# Patient Record
Sex: Female | Born: 1945
Health system: Southern US, Community
[De-identification: ages and names within clinical notes are randomized; demographics above are authoritative.]

## PROBLEM LIST (undated history)

## (undated) DIAGNOSIS — Z9981 Dependence on supplemental oxygen: Secondary | ICD-10-CM

## (undated) DIAGNOSIS — J449 Chronic obstructive pulmonary disease, unspecified: Secondary | ICD-10-CM

## (undated) DIAGNOSIS — I00 Rheumatic fever without heart involvement: Secondary | ICD-10-CM

## (undated) DIAGNOSIS — I10 Essential (primary) hypertension: Secondary | ICD-10-CM

## (undated) DIAGNOSIS — Z972 Presence of dental prosthetic device (complete) (partial): Secondary | ICD-10-CM

## (undated) DIAGNOSIS — C801 Malignant (primary) neoplasm, unspecified: Secondary | ICD-10-CM

## (undated) DIAGNOSIS — R0600 Dyspnea, unspecified: Secondary | ICD-10-CM

## (undated) DIAGNOSIS — I509 Heart failure, unspecified: Secondary | ICD-10-CM

## (undated) DIAGNOSIS — Z973 Presence of spectacles and contact lenses: Secondary | ICD-10-CM

## (undated) DIAGNOSIS — H3530 Unspecified macular degeneration: Secondary | ICD-10-CM

## (undated) DIAGNOSIS — J189 Pneumonia, unspecified organism: Secondary | ICD-10-CM

## (undated) DIAGNOSIS — M1990 Unspecified osteoarthritis, unspecified site: Secondary | ICD-10-CM

## (undated) DIAGNOSIS — T4145XA Adverse effect of unspecified anesthetic, initial encounter: Secondary | ICD-10-CM

## (undated) DIAGNOSIS — B029 Zoster without complications: Secondary | ICD-10-CM

## (undated) HISTORY — PX: MULTIPLE TOOTH EXTRACTIONS: SHX2053

## (undated) HISTORY — PX: CHOLECYSTECTOMY: SHX55

---
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
## 1995-08-26 DIAGNOSIS — T8859XA Other complications of anesthesia, initial encounter: Secondary | ICD-10-CM

## 1995-08-26 HISTORY — DX: Other complications of anesthesia, initial encounter: T88.59XA

## 2001-08-10 ENCOUNTER — Ambulatory Visit (HOSPITAL_COMMUNITY): Admission: RE | Admit: 2001-08-10 | Discharge: 2001-08-10 | Payer: Self-pay | Admitting: Family Medicine

## 2001-08-10 ENCOUNTER — Encounter: Payer: Self-pay | Admitting: Family Medicine

## 2002-08-12 ENCOUNTER — Encounter: Payer: Self-pay | Admitting: Family Medicine

## 2002-08-12 ENCOUNTER — Ambulatory Visit (HOSPITAL_COMMUNITY): Admission: RE | Admit: 2002-08-12 | Discharge: 2002-08-12 | Payer: Self-pay | Admitting: Family Medicine

## 2003-08-21 ENCOUNTER — Ambulatory Visit (HOSPITAL_COMMUNITY): Admission: RE | Admit: 2003-08-21 | Discharge: 2003-08-21 | Payer: Self-pay | Admitting: Family Medicine

## 2004-06-26 ENCOUNTER — Inpatient Hospital Stay (HOSPITAL_COMMUNITY): Admission: EM | Admit: 2004-06-26 | Discharge: 2004-07-01 | Payer: Self-pay | Admitting: Emergency Medicine

## 2004-07-15 ENCOUNTER — Ambulatory Visit (HOSPITAL_COMMUNITY): Admission: RE | Admit: 2004-07-15 | Discharge: 2004-07-15 | Payer: Self-pay | Admitting: Family Medicine

## 2005-09-04 ENCOUNTER — Ambulatory Visit (HOSPITAL_COMMUNITY): Admission: RE | Admit: 2005-09-04 | Discharge: 2005-09-04 | Payer: Self-pay | Admitting: Family Medicine

## 2007-04-15 ENCOUNTER — Ambulatory Visit (HOSPITAL_COMMUNITY): Admission: RE | Admit: 2007-04-15 | Discharge: 2007-04-15 | Payer: Self-pay | Admitting: Family Medicine

## 2011-01-10 NOTE — Group Therapy Note (Signed)
[name], [name]                 ACCOUNT NO.:  [id number]   MEDICAL RECORD NO.:  [id number]          PATIENT TYPE:  INP   LOCATION:  A206                          FACILITY:  [facility]   PHYSICIAN:  Vania Rea, M.D. DATE OF BIRTH:  05-03-1946   DATE OF PROCEDURE:  06/28/2004  DATE OF DISCHARGE:                                   PROGRESS NOTE   HOSPITAL DAY #3   SUBJECTIVE:  Feels much better, feels pretty close to her baseline, but is  having some visual hallucinations with her eyes closed.   OBJECTIVE:  VITALS:  Temperature 97.2, pulse 113, respirations 22, blood  pressure 111/54, fasting capillary blood sugar 141, she is saturating at 97%  on room air.  CHEST:  She has bibasilar crackles.  ABDOMEN:  Soft and nontender.  EXTREMITIES:  Without edema.   LABS:  Her white count has increased dramatically to 26,000.  Her hemoglobin  is 12.3.  Her platelets are 349,000.  Her absolute neutrophil count has  increased to 22.6.  Her sodium is 137, potassium 4.6, chloride 108, CO2 23,  glucose 135, BUN 12, creatinine 0.7, calcium 8.0.  Her ABG on room air this  morning -- pH was 7.41, pCO2 32, pO2 57.9, saturating at 89.9%.   ASSESSMENT:  1.  Bilobar pneumonia.  2.  Emphysema.  3.  Worsening leukocytosis.  4.  Acute exacerbation of chronic obstructive pulmonary disease.  5.  Persistent tachycardia.   PLAN:  In view of her green sputum and rising white count, we will culture  her sputum, reculture her blood, discontinue Rocephin and Zithromax in favor  of Vancomycin and Zosyn while her cultures are pending.  Her Solu-Medrol is  tapered and we will now start her on a tapered dose of Medrol since she has  a history of INTOLERANCE TO PREDNISONE.  In view of her persistent  tachycardia, we will try to resume her on the higher dose of Toprol which  she was getting as an outpatient and we will reduce her dose of Avapro.     Leop   LC/MEDQ  D:  06/28/2004  T:  06/28/2004  Job:   [id number]

## 2011-01-10 NOTE — Group Therapy Note (Signed)
Debbie [name], Debbie [name]                 ACCOUNT NO.:  [id number]   MEDICAL RECORD NO.:  [id number]          PATIENT TYPE:  INP   LOCATION:  A206                          FACILITY:  [facility]   PHYSICIAN:  Jackie Plum, M.D.DATE OF BIRTH:  1946/08/04   DATE OF PROCEDURE:  DATE OF DISCHARGE:                                   PROGRESS NOTE   SUBJECTIVE:  The patient denies visual hallucinations  today.  No chills.  Denies any fever.  Cough is better.  No chest pain.  Shortness of breath is  significantly improved.   OBJECTIVE:  Vital signs:  Stable.  Blood pressure 115/50, heart rate 102,  repeat radial pulse by me was 102 per minute.  The patient  was afebrile with respirations of 20 per minute.  No JVD.  Chest:  Crackles  at both bases with decreased breath sounds.  Cardiac:  Regular.  No gallops.  The patient was mildly tachycardic.  Abdomen:  Soft, nontender, bowel sounds  present and normoactive.  Extremities:  No cyanosis, no edema.   Lab work was reviewed.  Blood cultures x2 show one set positive for gram-  positive clusters.  WBC count down to 21.3 from 26.0.  Sodium is 132. .   ASSESSMENT:  1.  Pneumonia.  2. Chronic obstructive pulmonary disease.  3. Leukocytosis,      improving, secondary to diagnosis #1.   PLAN:  To continue antibiotic treatment with careful followup with  leukocytosis.  Seems overall to be improving.  Would not recommend CT scan  of chest at this point.     Geor   GO/MEDQ  D:  06/29/2004  T:  06/29/2004  Job:  [id number]

## 2011-01-10 NOTE — Discharge Summary (Signed)
Debbie [name], Debbie [name]                 ACCOUNT NO.:  [id number]   MEDICAL RECORD NO.:  [id number]          PATIENT TYPE:  INP   LOCATION:  A206                          FACILITY:  [facility]   PHYSICIAN:  Vania Rea, M.D. DATE OF BIRTH:  1945/09/08   DATE OF ADMISSION:  06/26/2004  DATE OF DISCHARGE:  11/07/2005LH                                 DISCHARGE SUMMARY   PRIMARY CARE PHYSICIAN:  Unassigned.   DISCHARGE DIAGNOSES:  1.  Bilateral lower lobe pneumonia.  2.  Acute exacerbation of chronic obstructive pulmonary disease.  3.  Emphysema.  4.  Persistent leukocytosis.  5.  Hypertension, controlled.   DISPOSITION:  Discharged to home.   DISCHARGE CONDITION:  Stable.   DISCHARGE MEDICATIONS:  1.  Levaquin 750 mg daily for five days.  2.  Advair 250/50 one puff twice daily.  3.  Spiriva 18 mcg daily.  4.  Mucinex 1200 mg twice daily.  5.  Toprol XL 100 mg daily.  6.  Avapro 150 mg daily.  7.  Prempro one daily.  8.  Albuterol inhaler two puffs every four hours as necessary.  9.  Medrol tablets 32 mg daily, tapering by 8 mg every three days.   HOSPITAL COURSE:  Please refer to admission history and physical.  This is a  65 year old Caucasian lady with chronic bronchitis and hypertension who  presented with acute onset of shortness of breath unrelieved by repeated  nebulizations in the emergency room.  Hospitalist services called to admit.  Chest x-ray revealed bilateral lower lobe pneumonia and she had a white  count elevated at 23.9  The patient was admitted, treated with antibiotics,  intravenous steroids and nebulizations and clinically improved to about 50%  of her baseline within 24 hours, but her white count dramatically increased  and she continued to cough up green sputum.  The patient had blood and  sputum cultures, but these were persistently negative for pathogens.  In  view of her rising white count, her antibiotics were changed from her  initial Rocephin and  Zithromax to vancomycin and Zosyn.  However, while she  continued to improve clinically, her white count continued to increase.   Because of a report of tachycardia associated with prednisone, the patient  was put on oral Medrol once intravenous steroids were discontinued.   Today, patient says that she actually feels much better than if at her  baseline, although she continues to cough.   VITAL SIGNS:  Temperature is 97.1; pulse 81; respirations 24; blood pressure  152/78; her fasting blood sugar was 85.  CHEST:  She has bibasilar dullness, but no wheezing.  CARDIOVASCULAR:  Regular rhythm.  ABDOMEN:  Soft and nontender.  EXTREMITIES:  Without edema.   LABORATORIES:  Her white count remains elevated at 25,000.  Her hemoglobin  stable at 13.6.  Her absolute neutrophil count is 16.6.  Sodium is 134,  potassium 3.8, chloride 98, CO2 29, glucose is 80, BUN 13, creatinine 0.7,  calcium 9.1.   FOLLOW UP:  The patient is to be followed up at [facility]  Department.  She is to have a CBC in one week, the results to go to  [facility] Department.     Leop   LC/MEDQ  D:  07/01/2004  T:  07/01/2004  Job:  [id number]

## 2011-01-10 NOTE — H&P (Signed)
[name], Debbie                 ACCOUNT NO.:  [id number]   MEDICAL RECORD NO.:  [id number]          PATIENT TYPE:  EMS   LOCATION:  ED                            FACILITY:  [facility]   PHYSICIAN:  Vania Rea, M.D. DATE OF BIRTH:  [birth year]   DATE OF ADMISSION:  06/26/2004  DATE OF DISCHARGE:  LH                                HISTORY & PHYSICAL   PRIMARY CARE PHYSICIAN:  [facility]   CHIEF COMPLAINT:  Acute shortness of breath for the past 2 days.   HISTORY OF PRESENT ILLNESS:  This is a 65 year old Caucasian lady with a  history of chronic bronchitis and hypertension whose baseline is that she  has wheezing every day.  It is helped by metered-dose inhalers and albuterol  nebs.  She takes Toprol-XL 100 mg daily for her blood pressure.  Was in her  until about 2 or 3 days ago when she developed a flulike syndrome with  fever, chills, nausea, vomiting, and diarrhea.  The patient had about 1 or 2  weeks ago gone to the public health department for acute exacerbation of her  COPD and had been given prednisone tablets but she said that they caused  palpitations so she was not taking them.  She went to the public health  department today and they sent her to the emergency room by ambulance.  In  the emergency room, the patient has had repeated nebs treatments without  settling of her shortness of breath.  Her chest x-ray shows bilateral lower  lobe infiltrates and the hospitalists service was called for admission.  baseline state of chronic shortness of breath and dyspnea on exertion   The patient says this is the worst attack she has had in a long time.  She  has never been admitted for acute shortness of breath.  Never been intubated  because of difficulty breathing.  Is having severe right-sided chest pain  associated with this breathing.  She denies any history of cardiac problems  or cardiac arrhythmias.   PAST MEDICAL HISTORY:  1.   Hypertension.  2.  Chronic bronchitis/COPD.  3.  Rheumatic fever as a child.   PAST SURGICAL HISTORY:  Status post gallbladder surgery in 1997.   MEDICATIONS:  1.  Toprol-XL 100 mg daily.  2.  Prempro one tablet daily.  3.  HCTZ 25 mg daily.  4.  Albuterol nebs p.r.n.  5.  Advair 250/50 one puff twice daily.  6.  Noncompliant with prednisone recently as prescribed.   ALLERGIES:  No known drug allergies.   SOCIAL HISTORY:  Divorced 10 years ago after a 30-year marriage producing 5  children.  She lives alone, works occasionally as a Neurosurgeon.  Discontinued tobacco use about 13 years ago after 30 years of one pack per  day.  Denies alcohol or illicit drug use.   FAMILY HISTORY:  Significant for a mother who died in her 110s of lung cancer  who was also diabetic.  A father who died at age 55 from a myocardial  infarction, also had prostate cancer.  She has 6 siblings.  One brother who  had a myocardial infarction.  Others are ill but she is not sure of the  exact nature of their medical problems.  She has 5 adult children who are  reportedly in good health.   REVIEW OF SYSTEMS:  She denies headaches, eye problems although she does  wear glasses.  She denies sore throat.  She has been having an upper  respiratory infection with a cough productive of sputum that she does not  look at and does not know the quality of it.  Has been having fever and  chills.  She usually has severe dyspnea walking about less than 1/2 a block.  Denies orthopnea, PND, or lower extremity edema.  Recently been having  nausea and vomiting as noted in the Admission History and Physical.  Does  not know what the stools look like.  No genitourinary problems.  Denies  joint problems.  Denies any anxiety, depression, focal weakness, or any  history of stroke.  Denies any history of thyroid problems or diabetes.   PHYSICAL EXAMINATION:  GENERAL:  A very pleasant middle-aged Caucasian lady  sitting up in the  stretcher clearly dyspneic and in distress.  VITAL SIGNS:  Temperature 97.5, pulse 142, blood pressure 147/96,  respiratory rate is 32.  Chest pain with breathing she says is 8/10.  She is  saturating at 92% with supplemental oxygen.  HEENT:  Her pupils are round, equal, and reactive to light.  Her mucous  membranes are pink and anicteric.  Her oral cavity is mildly dehydrated.  CHEST:  There is no frank wheezing at this time but she is very tachypneic.  She has just completed a nebs treatment.  HEART:  She is tachycardic.  ABDOMEN:  Soft, nontender.  EXTREMITIES:  Without edema.  Her pulses are 2+ bilaterally.  She has no  joint deformities.  CENTRAL NERVOUS SYSTEM:  She is alert and oriented x3.  She has no focal  neurologic deficit.   LABORATORY DATA:  Her white count is 23.9, hematocrit 44, MCV 91, RDW 12.3,  platelets 324.  Her absolute granulocyte count is 20.5, monocytes are also  slightly increased.  She has over 20% bandemia.  Her chemistry:  Sodium is  133, potassium 3, chloride 94, CO2 26, glucose 140, BUN 5, creatinine 0.8,  calcium 9.3, total protein 7.6, albumin 3.3, AST and ALT are 14 and 18, alk  phos is 117, total bilirubin 1.  Her B-natriuretic peptide is less than 30.  ABG done earlier in her hospital course, her pH was 7.51, PCO2 29.9, PO2 63,  her saturation was 93.8% on FiO2 of 32%.  Chest x-ray reports are as above.   ASSESSMENT:  1.  Acute exacerbation of chronic obstructive pulmonary disease possibly due      to #2.  2.  Bilateral lower lobe pneumonia.  3.  Hypokalemia.  4.  History of upper respiratory flulike syndrome with nausea, vomiting, and      diarrhea.  5.  Hyperglycemia.   PLAN:  We will admit this lady.  We will hydrate her.  We will repeat her  potassium.  We will start her on Rocephin & Zithromax pending a blood,  urine, and sputum cultures and  gram smear.  We will give her round-the- clock nebs and steroid support.  We will consider tapering  her beta-blockers  and substituting another form of antihypertensive medication.  We will have  her on a heart monitor.  Leop   LC/MEDQ  D:  06/26/2004  T:  06/26/2004  Job:  [id number]

## 2011-01-10 NOTE — Group Therapy Note (Signed)
Debbie [name], Debbie [name]                 ACCOUNT NO.:  [id number]   MEDICAL RECORD NO.:  [id number]          PATIENT TYPE:  INP   LOCATION:  A206                          FACILITY:  [facility]   PHYSICIAN:  Vania Rea, M.D. DATE OF BIRTH:  [birth year]   DATE OF PROCEDURE:  DATE OF DISCHARGE:                                   PROGRESS NOTE   SUBJECTIVE:  Hospital day two.  Feels much better but still very short of  breath.  Feels about 50% of her normal self.   PHYSICAL EXAMINATION:  VITAL SIGNS:  Temperature 97.1, pulse 116,  respirations 20, blood pressure 110/63, fasting capillary blood sugar 141,  saturating at 89% on room air, 92% on two liters.  CHEST:  She is tachypneic.  She has no wheezing.  Prolonged expiration.  Coarse bibasilar crackles.  CARDIOVASCULAR:  She is tachycardic.  ABDOMEN:  Soft, nontender.  EXTREMITIES:  She has trace edema bilaterally.   LABORATORY DATA:  Her ABG on room air pH 7.47, PCO2 32.2, PO2 59, 91%  saturation.  Her white count has fallen slightly to 20.2, hemoglobin 14.6,  platelets 329.  Her absolute neutrophil count 17.8.  Her sodium remains low  at 132, potassium has normalized at 4.2, chloride 101, CO2 25, glucose 188,  BUN 6, creatinine 0.7, calcium 8.8.   ASSESSMENT:  Acute exacerbation of chronic obstructive pulmonary disease and  bilobar pneumonia.  Will continue double antibiotic coverage.  Will await  results of her blood cultures.  Will start to taper her steroids.  Will  continue sliding scale coverage of her hyperglycemia.  Although her blood  sugars are in the diabetic range she is on steroids.  We will need to have  her checked as an outpatient for frank diabetes.  Her hypertension is  controlled on the current regimen of a reduced beta blockers and ARB's.     Leop   LC/MEDQ  D:  06/27/2004  T:  06/27/2004  Job:  [id number]

## 2012-11-10 ENCOUNTER — Other Ambulatory Visit (HOSPITAL_COMMUNITY): Payer: Self-pay | Admitting: Internal Medicine

## 2012-11-10 DIAGNOSIS — Z139 Encounter for screening, unspecified: Secondary | ICD-10-CM

## 2012-11-16 ENCOUNTER — Ambulatory Visit (HOSPITAL_COMMUNITY)
Admission: RE | Admit: 2012-11-16 | Discharge: 2012-11-16 | Disposition: A | Discharge status: Home / Self Care | Payer: Medicare Other | Source: Ambulatory Visit | Attending: Internal Medicine | Admitting: Internal Medicine

## 2012-11-16 DIAGNOSIS — Z1231 Encounter for screening mammogram for malignant neoplasm of breast: Secondary | ICD-10-CM | POA: Insufficient documentation

## 2012-11-16 DIAGNOSIS — Z139 Encounter for screening, unspecified: Secondary | ICD-10-CM

## 2012-11-17 ENCOUNTER — Other Ambulatory Visit: Payer: Self-pay | Admitting: Internal Medicine

## 2012-11-17 DIAGNOSIS — R928 Other abnormal and inconclusive findings on diagnostic imaging of breast: Secondary | ICD-10-CM

## 2012-11-24 ENCOUNTER — Ambulatory Visit (HOSPITAL_COMMUNITY)
Admission: RE | Admit: 2012-11-24 | Discharge: 2012-11-24 | Disposition: A | Discharge status: Home / Self Care | Payer: Medicare Other | Source: Ambulatory Visit | Attending: Internal Medicine | Admitting: Internal Medicine

## 2012-11-24 ENCOUNTER — Other Ambulatory Visit: Payer: Self-pay | Admitting: Internal Medicine

## 2012-11-24 DIAGNOSIS — R928 Other abnormal and inconclusive findings on diagnostic imaging of breast: Secondary | ICD-10-CM

## 2012-12-14 ENCOUNTER — Other Ambulatory Visit (HOSPITAL_COMMUNITY): Payer: Self-pay

## 2012-12-14 DIAGNOSIS — J441 Chronic obstructive pulmonary disease with (acute) exacerbation: Secondary | ICD-10-CM

## 2012-12-23 ENCOUNTER — Ambulatory Visit (HOSPITAL_COMMUNITY)
Admission: RE | Admit: 2012-12-23 | Payer: Medicare Other | Source: Ambulatory Visit | Attending: Internal Medicine | Admitting: Internal Medicine

## 2012-12-29 ENCOUNTER — Ambulatory Visit (HOSPITAL_COMMUNITY)
Admission: RE | Admit: 2012-12-29 | Discharge: 2012-12-29 | Disposition: A | Discharge status: Home / Self Care | Payer: Medicare Other | Source: Ambulatory Visit | Attending: Internal Medicine | Admitting: Internal Medicine

## 2012-12-29 DIAGNOSIS — J441 Chronic obstructive pulmonary disease with (acute) exacerbation: Secondary | ICD-10-CM | POA: Insufficient documentation

## 2012-12-29 DIAGNOSIS — R0609 Other forms of dyspnea: Secondary | ICD-10-CM | POA: Insufficient documentation

## 2012-12-29 DIAGNOSIS — R0989 Other specified symptoms and signs involving the circulatory and respiratory systems: Secondary | ICD-10-CM | POA: Insufficient documentation

## 2012-12-29 MED ORDER — ALBUTEROL SULFATE (5 MG/ML) 0.5% IN NEBU
2.5000 mg | INHALATION_SOLUTION | Freq: Once | RESPIRATORY_TRACT | Status: AC
  Administered 2012-12-29: 2.5 mg via RESPIRATORY_TRACT

## 2012-12-31 NOTE — Procedures (Signed)
Debbie [name], SHIFFER                 ACCOUNT NO.:  [id number]  MEDICAL RECORD NO.:  [id number]  LOCATION:  RESP                          FACILITY:  [facility]  PHYSICIAN:  Margrette Wynia L. Juanetta Gosling, M.D.DATE OF BIRTH:  Jan 20, 1946  DATE OF PROCEDURE: DATE OF DISCHARGE:                           PULMONARY FUNCTION TEST   REASON FOR PULMONARY FUNCTION TESTING:  COPD exacerbation.  1. Spirometry shows a severe ventilatory defect with evidence of     airflow obstruction. 2. Lung volumes show significant air trapping. 3. DLCO is severely reduced. 4. Airflow obstruction is elevated confirming the presence of airflow     obstruction. 5. This study is consistent with the clinical diagnosis of chronic     obstructive pulmonary disease.     Coralee Edberg L. Juanetta Gosling, M.D.     ELH/MEDQ  D:  12/30/2012  T:  12/31/2012  Job:  [id number]  cc:   Catalina Pizza, M.D. Fax: [phone]

## 2013-01-07 NOTE — Procedures (Signed)
Debbie [name], Debbie [name]                 ACCOUNT NO.:  [id number]  MEDICAL RECORD NO.:  [id number]  LOCATION:  RESP                          FACILITY:  [facility]  PHYSICIAN:  Sundance Moise L. Juanetta Gosling, M.D.DATE OF BIRTH:  1946-05-24  DATE OF PROCEDURE: DATE OF DISCHARGE:  12/29/2012                           PULMONARY FUNCTION TEST   REASON FOR PULMONARY FUNCTION TESTING:  COPD exacerbation.  1. Spirometry shows severe ventilatory defect with airflow     obstruction. 2. Lung volumes show marked air trapping. 3. DLCO is severely reduced. 4. Airway resistance is high confirming the presence of airflow     obstruction. 5. There is improvement with inhaled bronchodilator that reaches the     level of significance. 6. This study is consistent with COPD.     Thao Bauza L. Juanetta Gosling, M.D.     ELH/MEDQ  D:  01/06/2013  T:  01/07/2013  Job:  [id number]  cc:   Catalina Pizza, M.D. Fax: [phone]

## 2013-01-12 NOTE — Procedures (Signed)
NAMEMARIZOL, Debbie [name]                 ACCOUNT NO.:  [id number]  MEDICAL RECORD NO.:  [id number]  LOCATION:                                 FACILITY:  PHYSICIAN:  Dason Mosley L. Juanetta Gosling, M.D.DATE OF BIRTH:  [birth year]  DATE OF PROCEDURE:  01/11/2013 DATE OF DISCHARGE:                           PULMONARY FUNCTION TEST   Reason for pulmonary function testing is COPD exacerbation.  1. Spirometry shows severe ventilatory defect with evidence of airflow     obstruction. 2. Lung volumes show air trapping. 3. DLCO is severely reduced, but corrects somewhat when ventilation is     taken into account. 4. Airway resistance is elevated, confirming the presence of airflow     obstruction. 5. There is significant improvement with inhaled bronchodilator. 6. This study is consistent with COPD.     Delphina Schum L. Juanetta Gosling, M.D.     ELH/MEDQ  D:  01/11/2013  T:  01/12/2013  Job:  [id number]  cc:   Catalina Pizza, M.D. Fax: [phone]

## 2013-01-13 LAB — PULMONARY FUNCTION TEST

## 2013-02-22 ENCOUNTER — Institutional Professional Consult (permissible substitution): Payer: Medicare Other | Admitting: Internal Medicine

## 2014-01-30 ENCOUNTER — Other Ambulatory Visit (HOSPITAL_COMMUNITY): Payer: Self-pay | Admitting: Internal Medicine

## 2014-01-30 DIAGNOSIS — Z1231 Encounter for screening mammogram for malignant neoplasm of breast: Secondary | ICD-10-CM

## 2014-02-06 ENCOUNTER — Ambulatory Visit (HOSPITAL_COMMUNITY)
Admission: RE | Admit: 2014-02-06 | Discharge: 2014-02-06 | Disposition: A | Discharge status: Home / Self Care | Payer: Medicare Other | Source: Ambulatory Visit | Attending: Internal Medicine | Admitting: Internal Medicine

## 2014-02-06 DIAGNOSIS — Z1231 Encounter for screening mammogram for malignant neoplasm of breast: Secondary | ICD-10-CM

## 2014-09-14 DIAGNOSIS — J449 Chronic obstructive pulmonary disease, unspecified: Secondary | ICD-10-CM | POA: Diagnosis not present

## 2014-09-14 DIAGNOSIS — I10 Essential (primary) hypertension: Secondary | ICD-10-CM | POA: Diagnosis not present

## 2014-09-14 DIAGNOSIS — J019 Acute sinusitis, unspecified: Secondary | ICD-10-CM | POA: Diagnosis not present

## 2014-10-03 DIAGNOSIS — H8110 Benign paroxysmal vertigo, unspecified ear: Secondary | ICD-10-CM | POA: Diagnosis not present

## 2014-10-03 DIAGNOSIS — H65199 Other acute nonsuppurative otitis media, unspecified ear: Secondary | ICD-10-CM | POA: Diagnosis not present

## 2014-10-03 DIAGNOSIS — I10 Essential (primary) hypertension: Secondary | ICD-10-CM | POA: Diagnosis not present

## 2014-10-03 DIAGNOSIS — Z6822 Body mass index (BMI) 22.0-22.9, adult: Secondary | ICD-10-CM | POA: Diagnosis not present

## 2014-10-05 DIAGNOSIS — H8110 Benign paroxysmal vertigo, unspecified ear: Secondary | ICD-10-CM | POA: Diagnosis not present

## 2014-10-05 DIAGNOSIS — L570 Actinic keratosis: Secondary | ICD-10-CM | POA: Diagnosis not present

## 2014-10-05 DIAGNOSIS — I10 Essential (primary) hypertension: Secondary | ICD-10-CM | POA: Diagnosis not present

## 2014-10-05 DIAGNOSIS — J449 Chronic obstructive pulmonary disease, unspecified: Secondary | ICD-10-CM | POA: Diagnosis not present

## 2014-10-19 DIAGNOSIS — D0439 Carcinoma in situ of skin of other parts of face: Secondary | ICD-10-CM | POA: Diagnosis not present

## 2014-10-19 DIAGNOSIS — L570 Actinic keratosis: Secondary | ICD-10-CM | POA: Diagnosis not present

## 2014-10-19 DIAGNOSIS — Z85828 Personal history of other malignant neoplasm of skin: Secondary | ICD-10-CM | POA: Diagnosis not present

## 2014-10-19 DIAGNOSIS — Z08 Encounter for follow-up examination after completed treatment for malignant neoplasm: Secondary | ICD-10-CM | POA: Diagnosis not present

## 2014-10-19 DIAGNOSIS — X32XXXA Exposure to sunlight, initial encounter: Secondary | ICD-10-CM | POA: Diagnosis not present

## 2014-12-07 DIAGNOSIS — L570 Actinic keratosis: Secondary | ICD-10-CM | POA: Diagnosis not present

## 2014-12-07 DIAGNOSIS — X32XXXD Exposure to sunlight, subsequent encounter: Secondary | ICD-10-CM | POA: Diagnosis not present

## 2014-12-07 DIAGNOSIS — C44311 Basal cell carcinoma of skin of nose: Secondary | ICD-10-CM | POA: Diagnosis not present

## 2014-12-13 DIAGNOSIS — J449 Chronic obstructive pulmonary disease, unspecified: Secondary | ICD-10-CM | POA: Diagnosis not present

## 2014-12-13 DIAGNOSIS — J201 Acute bronchitis due to Hemophilus influenzae: Secondary | ICD-10-CM | POA: Diagnosis not present

## 2014-12-13 DIAGNOSIS — I10 Essential (primary) hypertension: Secondary | ICD-10-CM | POA: Diagnosis not present

## 2015-03-13 DIAGNOSIS — I10 Essential (primary) hypertension: Secondary | ICD-10-CM | POA: Diagnosis not present

## 2015-03-13 DIAGNOSIS — R51 Headache: Secondary | ICD-10-CM | POA: Diagnosis not present

## 2015-03-15 DIAGNOSIS — J449 Chronic obstructive pulmonary disease, unspecified: Secondary | ICD-10-CM | POA: Diagnosis not present

## 2015-03-15 DIAGNOSIS — I10 Essential (primary) hypertension: Secondary | ICD-10-CM | POA: Diagnosis not present

## 2015-04-06 DIAGNOSIS — J449 Chronic obstructive pulmonary disease, unspecified: Secondary | ICD-10-CM | POA: Diagnosis not present

## 2015-04-06 DIAGNOSIS — H65199 Other acute nonsuppurative otitis media, unspecified ear: Secondary | ICD-10-CM | POA: Diagnosis not present

## 2015-04-06 DIAGNOSIS — H8110 Benign paroxysmal vertigo, unspecified ear: Secondary | ICD-10-CM | POA: Diagnosis not present

## 2015-04-06 DIAGNOSIS — Z6822 Body mass index (BMI) 22.0-22.9, adult: Secondary | ICD-10-CM | POA: Diagnosis not present

## 2015-05-29 DIAGNOSIS — J449 Chronic obstructive pulmonary disease, unspecified: Secondary | ICD-10-CM | POA: Diagnosis not present

## 2015-05-29 DIAGNOSIS — I10 Essential (primary) hypertension: Secondary | ICD-10-CM | POA: Diagnosis not present

## 2015-06-19 DIAGNOSIS — Z23 Encounter for immunization: Secondary | ICD-10-CM | POA: Diagnosis not present

## 2015-07-26 DIAGNOSIS — L089 Local infection of the skin and subcutaneous tissue, unspecified: Secondary | ICD-10-CM | POA: Diagnosis not present

## 2015-10-04 DIAGNOSIS — I10 Essential (primary) hypertension: Secondary | ICD-10-CM | POA: Diagnosis not present

## 2015-10-09 DIAGNOSIS — H8110 Benign paroxysmal vertigo, unspecified ear: Secondary | ICD-10-CM | POA: Diagnosis not present

## 2015-10-09 DIAGNOSIS — I10 Essential (primary) hypertension: Secondary | ICD-10-CM | POA: Diagnosis not present

## 2015-10-09 DIAGNOSIS — J449 Chronic obstructive pulmonary disease, unspecified: Secondary | ICD-10-CM | POA: Diagnosis not present

## 2015-10-09 DIAGNOSIS — L608 Other nail disorders: Secondary | ICD-10-CM | POA: Diagnosis not present

## 2015-10-11 ENCOUNTER — Other Ambulatory Visit (HOSPITAL_COMMUNITY): Payer: Self-pay | Admitting: Internal Medicine

## 2015-10-11 ENCOUNTER — Ambulatory Visit (HOSPITAL_COMMUNITY)
Admission: RE | Admit: 2015-10-11 | Discharge: 2015-10-11 | Disposition: A | Discharge status: Home / Self Care | Payer: Medicare Other | Source: Ambulatory Visit | Attending: Internal Medicine | Admitting: Internal Medicine

## 2015-10-11 DIAGNOSIS — M795 Residual foreign body in soft tissue: Secondary | ICD-10-CM | POA: Diagnosis not present

## 2015-10-11 DIAGNOSIS — S6992XA Unspecified injury of left wrist, hand and finger(s), initial encounter: Secondary | ICD-10-CM | POA: Diagnosis not present

## 2015-10-19 ENCOUNTER — Observation Stay (HOSPITAL_COMMUNITY)
Admission: EM | Admit: 2015-10-19 | Discharge: 2015-10-21 | Disposition: A | Discharge status: Home / Self Care | Payer: Medicare Other | Attending: Internal Medicine | Admitting: Internal Medicine

## 2015-10-19 ENCOUNTER — Encounter (HOSPITAL_COMMUNITY): Payer: Self-pay

## 2015-10-19 ENCOUNTER — Emergency Department (HOSPITAL_COMMUNITY): Payer: Medicare Other

## 2015-10-19 DIAGNOSIS — I10 Essential (primary) hypertension: Secondary | ICD-10-CM | POA: Diagnosis not present

## 2015-10-19 DIAGNOSIS — J9601 Acute respiratory failure with hypoxia: Secondary | ICD-10-CM | POA: Diagnosis not present

## 2015-10-19 DIAGNOSIS — J441 Chronic obstructive pulmonary disease with (acute) exacerbation: Principal | ICD-10-CM | POA: Diagnosis present

## 2015-10-19 DIAGNOSIS — Z87891 Personal history of nicotine dependence: Secondary | ICD-10-CM | POA: Diagnosis not present

## 2015-10-19 DIAGNOSIS — R0682 Tachypnea, not elsewhere classified: Secondary | ICD-10-CM | POA: Diagnosis not present

## 2015-10-19 DIAGNOSIS — R0602 Shortness of breath: Secondary | ICD-10-CM | POA: Diagnosis not present

## 2015-10-19 DIAGNOSIS — R000 Tachycardia, unspecified: Secondary | ICD-10-CM | POA: Diagnosis not present

## 2015-10-19 DIAGNOSIS — R062 Wheezing: Secondary | ICD-10-CM | POA: Diagnosis not present

## 2015-10-19 DIAGNOSIS — J9811 Atelectasis: Secondary | ICD-10-CM | POA: Diagnosis not present

## 2015-10-19 HISTORY — DX: Essential (primary) hypertension: I10

## 2015-10-19 HISTORY — DX: Chronic obstructive pulmonary disease, unspecified: J44.9

## 2015-10-19 HISTORY — DX: Rheumatic fever without heart involvement: I00

## 2015-10-19 LAB — CBC WITH DIFFERENTIAL/PLATELET
BASOS ABS: 0 10*3/uL (ref 0.0–0.1)
Basophils Relative: 0 %
EOS PCT: 0 %
Eosinophils Absolute: 0 10*3/uL (ref 0.0–0.7)
HCT: 44.5 % (ref 36.0–46.0)
Hemoglobin: 14.7 g/dL (ref 12.0–15.0)
LYMPHS PCT: 24 %
Lymphs Abs: 1.6 10*3/uL (ref 0.7–4.0)
MCH: 31.1 pg (ref 26.0–34.0)
MCHC: 33 g/dL (ref 30.0–36.0)
MCV: 94.1 fL (ref 78.0–100.0)
MONO ABS: 0.8 10*3/uL (ref 0.1–1.0)
MONOS PCT: 11 %
Neutro Abs: 4.3 10*3/uL (ref 1.7–7.7)
Neutrophils Relative %: 65 %
PLATELETS: 194 10*3/uL (ref 150–400)
RBC: 4.73 MIL/uL (ref 3.87–5.11)
RDW: 12.6 % (ref 11.5–15.5)
WBC: 6.7 10*3/uL (ref 4.0–10.5)

## 2015-10-19 LAB — MRSA PCR SCREENING: MRSA by PCR: NEGATIVE

## 2015-10-19 LAB — BASIC METABOLIC PANEL
ANION GAP: 10 (ref 5–15)
BUN: 8 mg/dL (ref 6–20)
CALCIUM: 9.1 mg/dL (ref 8.9–10.3)
CO2: 28 mmol/L (ref 22–32)
CREATININE: 0.67 mg/dL (ref 0.44–1.00)
Chloride: 101 mmol/L (ref 101–111)
GFR calc Af Amer: >60 mL/min (ref >60)
GLUCOSE: 138 mg/dL — AB (ref 65–99)
Potassium: 4.3 mmol/L (ref 3.5–5.1)
Sodium: 139 mmol/L (ref 135–145)

## 2015-10-19 LAB — BRAIN NATRIURETIC PEPTIDE: B Natriuretic Peptide: 25 pg/mL (ref 0.0–100.0)

## 2015-10-19 LAB — INFLUENZA PANEL BY PCR (TYPE A & B)
H1N1FLUPCR: NOT DETECTED
INFLBPCR: NEGATIVE
Influenza A By PCR: NEGATIVE

## 2015-10-19 LAB — TROPONIN I: Troponin I: <0.03 ng/mL (ref <0.031)

## 2015-10-19 MED ORDER — SODIUM CHLORIDE 0.9% FLUSH
3.0000 mL | Freq: Two times a day (BID) | INTRAVENOUS | Status: DC
  Administered 2015-10-19 – 2015-10-20 (×3): 3 mL via INTRAVENOUS

## 2015-10-19 MED ORDER — IPRATROPIUM BROMIDE 0.02 % IN SOLN
0.5000 mg | Freq: Once | RESPIRATORY_TRACT | Status: AC
  Administered 2015-10-19: 0.5 mg via RESPIRATORY_TRACT
  Filled 2015-10-19: qty 2.5

## 2015-10-19 MED ORDER — ALBUTEROL SULFATE (2.5 MG/3ML) 0.083% IN NEBU
2.5000 mg | INHALATION_SOLUTION | RESPIRATORY_TRACT | Status: DC | PRN
Start: 2015-10-19 — End: 2015-10-21
  Administered 2015-10-19 – 2015-10-20 (×2): 2.5 mg via RESPIRATORY_TRACT
  Filled 2015-10-19 (×2): qty 3

## 2015-10-19 MED ORDER — SENNOSIDES-DOCUSATE SODIUM 8.6-50 MG PO TABS: 1.0000 | ORAL_TABLET | Freq: Every evening | ORAL | Status: DC | PRN

## 2015-10-19 MED ORDER — METHYLPREDNISOLONE SODIUM SUCC 125 MG IJ SOLR
125.0000 mg | Freq: Once | INTRAMUSCULAR | Status: AC
  Administered 2015-10-19: 125 mg via INTRAVENOUS
  Filled 2015-10-19: qty 2

## 2015-10-19 MED ORDER — IPRATROPIUM-ALBUTEROL 0.5-2.5 (3) MG/3ML IN SOLN
3.0000 mL | Freq: Four times a day (QID) | RESPIRATORY_TRACT | Status: DC
  Administered 2015-10-20 – 2015-10-21 (×7): 3 mL via RESPIRATORY_TRACT
  Filled 2015-10-19 (×7): qty 3

## 2015-10-19 MED ORDER — OSELTAMIVIR PHOSPHATE 75 MG PO CAPS
75.0000 mg | ORAL_CAPSULE | Freq: Two times a day (BID) | ORAL | Status: DC
  Administered 2015-10-19 – 2015-10-20 (×3): 75 mg via ORAL
  Filled 2015-10-19 (×3): qty 1

## 2015-10-19 MED ORDER — SODIUM CHLORIDE 0.9% FLUSH
3.0000 mL | INTRAVENOUS | Status: DC | PRN
  Administered 2015-10-19: 3 mL via INTRAVENOUS
  Filled 2015-10-19: qty 3

## 2015-10-19 MED ORDER — MAGNESIUM SULFATE IN D5W 10-5 MG/ML-% IV SOLN
1.0000 g | Freq: Once | INTRAVENOUS | Status: AC
  Administered 2015-10-19: 1 g via INTRAVENOUS
  Filled 2015-10-19: qty 100

## 2015-10-19 MED ORDER — LEVOFLOXACIN 750 MG PO TABS
750.0000 mg | ORAL_TABLET | Freq: Every day | ORAL | Status: DC
  Administered 2015-10-19 – 2015-10-21 (×3): 750 mg via ORAL
  Filled 2015-10-19 (×3): qty 1

## 2015-10-19 MED ORDER — LEVOFLOXACIN IN D5W 500 MG/100ML IV SOLN
500.0000 mg | Freq: Once | INTRAVENOUS | Status: AC
  Administered 2015-10-19: 500 mg via INTRAVENOUS
  Filled 2015-10-19: qty 100

## 2015-10-19 MED ORDER — ONDANSETRON HCL 4 MG/2ML IJ SOLN
4.0000 mg | Freq: Four times a day (QID) | INTRAMUSCULAR | Status: DC | PRN
  Administered 2015-10-20: 4 mg via INTRAVENOUS
  Filled 2015-10-19: qty 2

## 2015-10-19 MED ORDER — ALBUTEROL SULFATE (2.5 MG/3ML) 0.083% IN NEBU
2.5000 mg | INHALATION_SOLUTION | Freq: Four times a day (QID) | RESPIRATORY_TRACT | Status: DC
  Administered 2015-10-19: 2.5 mg via RESPIRATORY_TRACT
  Filled 2015-10-19 (×2): qty 3

## 2015-10-19 MED ORDER — IPRATROPIUM BROMIDE 0.02 % IN SOLN
0.5000 mg | Freq: Four times a day (QID) | RESPIRATORY_TRACT | Status: DC
  Administered 2015-10-19: 0.5 mg via RESPIRATORY_TRACT
  Filled 2015-10-19 (×2): qty 2.5

## 2015-10-19 MED ORDER — ONDANSETRON HCL 4 MG PO TABS: 4.0000 mg | ORAL_TABLET | Freq: Four times a day (QID) | ORAL | Status: DC | PRN

## 2015-10-19 MED ORDER — PNEUMOCOCCAL VAC POLYVALENT 25 MCG/0.5ML IJ INJ: 0.5000 mL | INJECTION | INTRAMUSCULAR | Status: DC

## 2015-10-19 MED ORDER — METHYLPREDNISOLONE SODIUM SUCC 125 MG IJ SOLR
60.0000 mg | Freq: Four times a day (QID) | INTRAMUSCULAR | Status: DC
  Administered 2015-10-19 – 2015-10-21 (×8): 60 mg via INTRAVENOUS
  Filled 2015-10-19 (×8): qty 2

## 2015-10-19 MED ORDER — MOMETASONE FURO-FORMOTEROL FUM 200-5 MCG/ACT IN AERO
2.0000 | INHALATION_SPRAY | Freq: Two times a day (BID) | RESPIRATORY_TRACT | Status: DC
  Administered 2015-10-19 – 2015-10-21 (×4): 2 via RESPIRATORY_TRACT
  Filled 2015-10-19: qty 8.8

## 2015-10-19 MED ORDER — METOPROLOL SUCCINATE ER 50 MG PO TB24
100.0000 mg | ORAL_TABLET | Freq: Every day | ORAL | Status: DC
  Administered 2015-10-19 – 2015-10-21 (×3): 100 mg via ORAL
  Filled 2015-10-19 (×3): qty 2

## 2015-10-19 MED ORDER — ACETAMINOPHEN 325 MG PO TABS
650.0000 mg | ORAL_TABLET | Freq: Four times a day (QID) | ORAL | Status: DC | PRN
  Administered 2015-10-19 (×2): 650 mg via ORAL
  Filled 2015-10-19 (×2): qty 2

## 2015-10-19 MED ORDER — ACETAMINOPHEN 650 MG RE SUPP: 650.0000 mg | Freq: Four times a day (QID) | RECTAL | Status: DC | PRN

## 2015-10-19 MED ORDER — AMLODIPINE BESYLATE 5 MG PO TABS
5.0000 mg | ORAL_TABLET | Freq: Every day | ORAL | Status: DC
  Administered 2015-10-19 – 2015-10-21 (×3): 5 mg via ORAL
  Filled 2015-10-19 (×3): qty 1

## 2015-10-19 MED ORDER — ALBUTEROL (5 MG/ML) CONTINUOUS INHALATION SOLN
10.0000 mg/h | INHALATION_SOLUTION | RESPIRATORY_TRACT | Status: DC
  Administered 2015-10-19: 10 mg/h via RESPIRATORY_TRACT
  Filled 2015-10-19: qty 20

## 2015-10-19 MED ORDER — SODIUM CHLORIDE 0.9 % IV SOLN: 250.0000 mL | INTRAVENOUS | Status: DC | PRN

## 2015-10-19 MED ORDER — ENOXAPARIN SODIUM 40 MG/0.4ML ~~LOC~~ SOLN
40.0000 mg | SUBCUTANEOUS | Status: DC
  Administered 2015-10-19 – 2015-10-20 (×2): 40 mg via SUBCUTANEOUS
  Filled 2015-10-19 (×3): qty 0.4

## 2015-10-19 MED ORDER — OXYCODONE HCL 5 MG PO TABS
5.0000 mg | ORAL_TABLET | ORAL | Status: DC | PRN
Start: 2015-10-19 — End: 2015-10-21

## 2015-10-19 NOTE — Progress Notes (Signed)
Called report to [place], Therapist, sports on dept 300. Verbalized understanding. Pt transferred to floor in safe and sound condition.

## 2015-10-19 NOTE — ED Provider Notes (Addendum)
CSN: [id number]     Arrival date & time 10/19/15  0416 History   First MD Initiated Contact with Patient 10/19/15 0425     Chief Complaint  Patient presents with  . Shortness of Breath     (Consider location/radiation/quality/duration/timing/severity/associated sxs/prior Treatment) HPI  This is a 71 year old female with a history of hypertension and COPD who presents with shortness of breath. Patient reports a 2 to three-day history of worsening shortness of breath. She reports cough and chills. She has been using her nebulizer at home with minimal relief. She became more short of breath tonight. She required supplemental oxygen by EMS. She does not wear oxygen at home. She also endorses chest tightness.  No documented fevers. Denies leg swelling or history of heart failure.  Past Medical History  Diagnosis Date  . COPD (chronic obstructive pulmonary disease) ([place])   . Hypertension   . Rheumatic fever    Past Surgical History  Procedure Laterality Date  . Cholecystectomy     History reviewed. No pertinent family history. Social History  Substance Use Topics  . Smoking status: Former Research scientist (life sciences)  . Smokeless tobacco: None  . Alcohol Use: No   OB History    No data available     Review of Systems  Constitutional: Negative for fever.  Respiratory: Positive for cough, chest tightness and shortness of breath.   Cardiovascular: Negative for chest pain and leg swelling.  Gastrointestinal: Negative for nausea, vomiting and abdominal pain.  Musculoskeletal: Negative for back pain.  All other systems reviewed and are negative.     Allergies  Review of patient's allergies indicates no known allergies.  Home Medications   Prior to Admission medications   Medication Sig Start Date End Date Taking? Authorizing Provider  albuterol (PROVENTIL HFA;VENTOLIN HFA) 108 (90 Base) MCG/ACT inhaler Inhale 1 puff into the lungs every 6 (six) hours as needed for wheezing or shortness of breath.    Yes Historical Provider, MD  ipratropium-albuterol (DUONEB) 0.5-2.5 (3) MG/3ML SOLN Take 3 mLs by nebulization.   Yes Historical Provider, MD  metoprolol succinate (TOPROL-XL) 100 MG 24 hr tablet Take 100 mg by mouth daily. Take with or immediately following a meal.   Yes Historical Provider, MD   BP 138/87 mmHg  Pulse 105  Temp(Src) 98.7 F (37.1 C) (Oral)  Resp 18  Ht 5\' 2"  (1.575 m)  Wt 128 lb (58.06 kg)  BMI 23.41 kg/m2  SpO2 95% Physical Exam  Constitutional: She is oriented to person, place, and time.  Ill-appearing  HENT:  Head: Normocephalic and atraumatic.  Cardiovascular: Regular rhythm and normal heart sounds.   Tachycardia  Pulmonary/Chest: No respiratory distress. She has no wheezes.  Tachypnea, poor air movement, scant expiratory squeak  Abdominal: Soft. Bowel sounds are normal. There is no tenderness.  Musculoskeletal: She exhibits no edema.  Neurological: She is alert and oriented to person, place, and time.  Skin: Skin is warm and dry.  Psychiatric: She has a normal mood and affect.  Nursing note and vitals reviewed.   ED Course  Procedures (including critical care time)  CRITICAL CARE Performed by: Merryl Hacker   Total critical care time: 30 minutes  Critical care time was exclusive of separately billable procedures and treating other patients.  Critical care was necessary to treat or prevent imminent or life-threatening deterioration.  Critical care was time spent personally by me on the following activities: development of treatment plan with patient and/or surrogate as well as nursing, discussions with consultants,  evaluation of patient's response to treatment, examination of patient, obtaining history from patient or surrogate, ordering and performing treatments and interventions, ordering and review of laboratory studies, ordering and review of radiographic studies, pulse oximetry and re-evaluation of patient's condition.  Labs Review Labs  Reviewed  BASIC METABOLIC PANEL - Abnormal; Notable for the following:    Glucose, Bld 138 (*)    All other components within normal limits  CBC WITH DIFFERENTIAL/PLATELET  BRAIN NATRIURETIC PEPTIDE  TROPONIN I    Imaging Review Dg Chest Portable 1 View  10/19/2015  CLINICAL DATA:  Acute onset of shortness of breath. Initial encounter. EXAM: PORTABLE CHEST 1 VIEW COMPARISON:  Chest radiograph performed 07/15/2004 FINDINGS: The lungs are well-aerated. Mild vascular congestion is noted. Minimal bibasilar atelectasis is seen. There is no evidence of pleural effusion or pneumothorax. The cardiomediastinal silhouette is within normal limits. No acute osseous abnormalities are seen. IMPRESSION: Mild vascular congestion noted.  Minimal bibasilar atelectasis seen. Electronically Signed   By: Garald Balding M.D.   On: 10/19/2015 05:33   I have personally reviewed and evaluated these images and lab results as part of my medical decision-making.   EKG Interpretation   Date/Time:  Friday October 19 2015 04:30:18 EST Ventricular Rate:  108 PR Interval:  168 QRS Duration: 94 QT Interval:  323 QTC Calculation: 433 R Axis:   80 Text Interpretation:  Sinus tachycardia Right atrial enlargement Low  voltage, precordial leads Probable anteroseptal infarct, old Artifact in  lead(s) I II III aVR aVL V1 V2 V3 V4 Confirmed by Fain Francis  MD, [place]  [id number]) on 10/19/2015 4:35:07 AM      MDM   Final diagnoses:  COPD exacerbation ([place])    Patient presents with increasing shortness of breath and cough. Tachypnea noted on exam as well as poor air movement and scant wheezing. Patient was placed on a continuous neb. She was given Solu-Medrol and Levaquin for presumed COPD exacerbation. EKG is without evidence of acute ischemia.  Basic labwork obtained and largely reassuring including BNP and troponin. Chest x-ray does show evidence of mild vascular congestion. No history of heart failure.  6:01 AM On  recheck, patient has almost finished her continuous neb. Somewhat improved air movement. She did attempt to get to the bedside commode and got pale and had increased work of breathing. She continues to wheeze throughout all lung fields.  1 g of IV Mag ordered.  Feel patient needs admission for frequent nebs and further evaluation. She likely needs a cardiac echo as well given evidence of vascular congestion on her chest x-ray and possible mixed picture for shortness of breath.      Merryl Hacker, MD 10/19/15 [id number]  Merryl Hacker, MD 10/19/15 [phone]

## 2015-10-19 NOTE — H&P (Signed)
[facility]          History and Physical    PCP:   Wende Neighbors, MD   EDP: Thayer Jew, MD  Chief Complaint:  Shortness of breath  HPI: Patient is a 70 year old woman with history of COPD and hypertension who presents to the hospital today with a 2 day history of worsening shortness of breath. She endorses cough and chills, has not actually taken her temperature. She has been using her albuterol at home without relief. She denies any chest pain. She is hypoxemic and is requiring supplemental oxygen where she does not wear oxygen at home. She has failed to improve with treatment in the emergency department and we are asked to admit for further evaluation and management.  Allergies:  No Known Allergies    Past Medical History  Diagnosis Date  . COPD (chronic obstructive pulmonary disease) ([place])   . Hypertension   . Rheumatic fever     Past Surgical History  Procedure Laterality Date  . Cholecystectomy      Prior to Admission medications   Medication Sig Start Date End Date Taking? Authorizing Provider  albuterol (PROVENTIL HFA;VENTOLIN HFA) 108 (90 Base) MCG/ACT inhaler Inhale 1 puff into the lungs every 6 (six) hours as needed for wheezing or shortness of breath.   Yes Historical Provider, MD  ipratropium-albuterol (DUONEB) 0.5-2.5 (3) MG/3ML SOLN Take 3 mLs by nebulization every 6 (six) hours as needed (shortness of breath/wheezing).    Yes Historical Provider, MD  metoprolol succinate (TOPROL-XL) 100 MG 24 hr tablet Take 100 mg by mouth daily. Take with or immediately following a meal.   Yes Historical Provider, MD  SYMBICORT 160-4.5 MCG/ACT inhaler Inhale 1 puff into the lungs 2 (two) times daily. 09/28/15  Yes Historical Provider, MD  amLODipine (NORVASC) 5 MG tablet Take 5 mg by mouth daily. 10/17/15   Historical Provider, MD    Social History:  reports that she has quit smoking. She does not have any smokeless tobacco history on file. She reports  that she does not drink alcohol or use illicit drugs.  Family history: No history of heart disease or stroke  Review of Systems:  Constitutional: Denies fever,  diaphoresis, appetite change and fatigue.  HEENT: Denies photophobia, eye pain, redness, hearing loss, ear pain, congestion, sore throat, rhinorrhea, sneezing, mouth sores, trouble swallowing, neck pain, neck stiffness and tinnitus.   Respiratory: Positive for SOB, DOE, cough, chest tightness,  and wheezing.   Cardiovascular: Denies chest pain, palpitations and leg swelling.  Gastrointestinal: Denies nausea, vomiting, abdominal pain, diarrhea, constipation, blood in stool and abdominal distention.  Genitourinary: Denies dysuria, urgency, frequency, hematuria, flank pain and difficulty urinating.  Endocrine: Denies: hot or cold intolerance, sweats, changes in hair or nails, polyuria, polydipsia. Musculoskeletal: Denies myalgias, back pain, joint swelling, arthralgias and gait problem.  Skin: Denies pallor, rash and wound.  Neurological: Denies dizziness, seizures, syncope, weakness, light-headedness, numbness and headaches.  Hematological: Denies adenopathy. Easy bruising, personal or family bleeding history  Psychiatric/Behavioral: Denies suicidal ideation, mood changes, confusion, nervousness, sleep disturbance and agitation   Physical Exam: Blood pressure 116/72, pulse 110, temperature 97.9 F (36.6 C), temperature source Oral, resp. rate 29, height 5' 1"  (1.549 m), weight 58.2 kg (128 lb 4.9 oz), SpO2 94 %. General: Alert, awake, oriented 3, marked tachypnea and difficulty speaking in full sentences HEENT: Normocephalic, atraumatic, pupils equal round and reactive to light, extra clinical  movements intact, wears corrective lenses, moist mucous membranes Neck: Supple, no JVD, no lymphadenopathy, no bruits, no goiter Cardiovascular: Tachycardic, regular rhythm rhythm, no murmurs, rubs or gallops Lungs: Poor air movement, no  audible wheezing or crackles Abdomen: Soft, nontender, nondistended, positive bowel sounds Extremities: No clubbing, cyanosis or edema, positive pulses Neurologic: Grossly intact and nonfocal  Labs on Admission:  Results for orders placed or performed during the hospital encounter of 10/19/15 (from the past 48 hour(s))  CBC with Differential     Status: None   Collection Time: 10/19/15  4:25 AM  Result Value Ref Range   WBC 6.7 4.0 - 10.5 K/uL   RBC 4.73 3.87 - 5.11 MIL/uL   Hemoglobin 14.7 12.0 - 15.0 g/dL   HCT 44.5 36.0 - 46.0 %   MCV 94.1 78.0 - 100.0 fL   MCH 31.1 26.0 - 34.0 pg   MCHC 33.0 30.0 - 36.0 g/dL   RDW 12.6 11.5 - 15.5 %   Platelets 194 150 - 400 K/uL   Neutrophils Relative % 65 %   Neutro Abs 4.3 1.7 - 7.7 K/uL   Lymphocytes Relative 24 %   Lymphs Abs 1.6 0.7 - 4.0 K/uL   Monocytes Relative 11 %   Monocytes Absolute 0.8 0.1 - 1.0 K/uL   Eosinophils Relative 0 %   Eosinophils Absolute 0.0 0.0 - 0.7 K/uL   Basophils Relative 0 %   Basophils Absolute 0.0 0.0 - 0.1 K/uL  Basic metabolic panel     Status: Abnormal   Collection Time: 10/19/15  4:25 AM  Result Value Ref Range   Sodium 139 135 - 145 mmol/L   Potassium 4.3 3.5 - 5.1 mmol/L   Chloride 101 101 - 111 mmol/L   CO2 28 22 - 32 mmol/L   Glucose, Bld 138 (H) 65 - 99 mg/dL   BUN 8 6 - 20 mg/dL   Creatinine, Ser 0.67 0.44 - 1.00 mg/dL   Calcium 9.1 8.9 - 10.3 mg/dL   GFR calc non Af Amer >60 >60 mL/min   GFR calc Af Amer >60 >60 mL/min    Comment: (NOTE) The eGFR has been calculated using the CKD EPI equation. This calculation has not been validated in all clinical situations. eGFR's persistently <60 mL/min signify possible Chronic Kidney Disease.    Anion gap 10 5 - 15  Brain natriuretic peptide     Status: None   Collection Time: 10/19/15  4:25 AM  Result Value Ref Range   B Natriuretic Peptide 25.0 0.0 - 100.0 pg/mL  Troponin I     Status: None   Collection Time: 10/19/15  4:25 AM  Result Value  Ref Range   Troponin I <0.03 <0.031 ng/mL    Comment:        NO INDICATION OF MYOCARDIAL INJURY.     Radiological Exams on Admission: Dg Chest Portable 1 View  10/19/2015  CLINICAL DATA:  Acute onset of shortness of breath. Initial encounter. EXAM: PORTABLE CHEST 1 VIEW COMPARISON:  Chest radiograph performed 07/15/2004 FINDINGS: The lungs are well-aerated. Mild vascular congestion is noted. Minimal bibasilar atelectasis is seen. There is no evidence of pleural effusion or pneumothorax. The cardiomediastinal silhouette is within normal limits. No acute osseous abnormalities are seen. IMPRESSION: Mild vascular congestion noted.  Minimal bibasilar atelectasis seen. Electronically Signed   By: Garald Balding M.D.   On: 10/19/2015 05:33    Assessment/Plan Principal Problem:   COPD exacerbation (HCC) Active Problems:   HTN (hypertension)  Acute respiratory failure with hypoxia (HCC)    Acute hypoxemic respiratory failure -Due to COPD with acute exacerbation. -Oxygen as tolerated. -Please see below for details.  COPD with acute exacerbation -Admit to the hospital, start IV steroids, antibiotic, frequent nebulizer treatments. -Feel it is important to rule out influenza, flu PCR has been ordered and has been started empirically on Tamiflu which can be discontinued if flu screen returns negative.  Hypertension -Continue home doses of Norvasc and metoprolol.  DVT prophylaxis -Lovenox  CODE STATUS -Full code   Time Spent on Admission: 85 minutes  [facility] Pager: [phone] 10/19/2015, 11:15 AM

## 2015-10-19 NOTE — ED Notes (Signed)
Pt awoke this am with sob, reports chest tightness.

## 2015-10-20 DIAGNOSIS — J441 Chronic obstructive pulmonary disease with (acute) exacerbation: Secondary | ICD-10-CM | POA: Diagnosis not present

## 2015-10-20 DIAGNOSIS — J9601 Acute respiratory failure with hypoxia: Secondary | ICD-10-CM | POA: Diagnosis not present

## 2015-10-20 DIAGNOSIS — I10 Essential (primary) hypertension: Secondary | ICD-10-CM | POA: Diagnosis not present

## 2015-10-20 LAB — CBC
HEMATOCRIT: 45 % (ref 36.0–46.0)
Hemoglobin: 14.8 g/dL (ref 12.0–15.0)
MCH: 31.1 pg (ref 26.0–34.0)
MCHC: 32.9 g/dL (ref 30.0–36.0)
MCV: 94.5 fL (ref 78.0–100.0)
Platelets: 223 10*3/uL (ref 150–400)
RBC: 4.76 MIL/uL (ref 3.87–5.11)
RDW: 12.8 % (ref 11.5–15.5)
WBC: 8.8 10*3/uL (ref 4.0–10.5)

## 2015-10-20 LAB — BASIC METABOLIC PANEL
Anion gap: 11 (ref 5–15)
BUN: 11 mg/dL (ref 6–20)
CHLORIDE: 101 mmol/L (ref 101–111)
CO2: 29 mmol/L (ref 22–32)
Calcium: 9.5 mg/dL (ref 8.9–10.3)
Creatinine, Ser: 0.6 mg/dL (ref 0.44–1.00)
GFR calc Af Amer: >60 mL/min (ref >60)
GFR calc non Af Amer: >60 mL/min (ref >60)
Glucose, Bld: 146 mg/dL — ABNORMAL HIGH (ref 65–99)
POTASSIUM: 4 mmol/L (ref 3.5–5.1)
Sodium: 141 mmol/L (ref 135–145)

## 2015-10-20 NOTE — Progress Notes (Signed)
[facility] PROGRESS NOTE  JOSI HUMISTON [id number] DOB: 1946/03/07 DOA: 10/19/2015 PCP: Wende Neighbors, MD  Assessment/Plan: Acute hypoxemic respiratory failure -Due to COPD with acute exacerbation.  -Continue to wean oxygen as tolerated. -Please see below for details.  COPD with acute exacerbation -Improved today, minimal wheezing although air movement is still not ideal. -Continue IV steroids, antibiotics, frequent nebs. -Flu PCR is negative, will discontinue Tamiflu.  Hypertension -Fair control, continue current regimen  Code Status: Full code Family Communication: Patient only  Disposition Plan: Anticipate discharge home in 24-48 hours   Consultants:  None   Antibiotics:  Levaquin   Subjective: Feels well, shortness of breath improved  Objective: Filed Vitals:   10/20/15 0103 10/20/15 0846 10/20/15 0900 10/20/15 1408  BP:   141/70   Pulse:   102   Temp:   98.8 F (37.1 C)   TempSrc:      Resp:   22   Height:      Weight:      SpO2: 94% 92%  94%   No intake or output data in the 24 hours ending 10/20/15 1620 Filed Weights   10/19/15 0421 10/19/15 0827  Weight: 58.06 kg (128 lb) 58.2 kg (128 lb 4.9 oz)    Exam:   General:  Alert, awake, oriented 3  Cardiovascular: Regular rate and rhythm, no murmurs, rubs or gallops  Respiratory: Decreased air movement, no wheezes crackles or rhonchi  Abdomen: Soft, nontender, nondistended, positive bowel sounds  Extremities: No clubbing, cyanosis or edema, positive pulses   Neurologic:  Grossly intact and nonfocal  Data Reviewed: Basic Metabolic Panel:  Recent Labs Lab 10/19/15 0425 10/20/15 0611  NA 139 141  K 4.3 4.0  CL 101 101  CO2 28 29  GLUCOSE 138* 146*  BUN 8 11  CREATININE 0.67 0.60  CALCIUM 9.1 9.5   Liver Function Tests: No results for input(s): AST, ALT, ALKPHOS, BILITOT, PROT, ALBUMIN in the last 168 hours. No results for input(s): LIPASE, AMYLASE in the last 168  hours. No results for input(s): AMMONIA in the last 168 hours. CBC:  Recent Labs Lab 10/19/15 0425 10/20/15 0611  WBC 6.7 8.8  NEUTROABS 4.3  --   HGB 14.7 14.8  HCT 44.5 45.0  MCV 94.1 94.5  PLT 194 223   Cardiac Enzymes:  Recent Labs Lab 10/19/15 0425  TROPONINI <0.03   BNP (last 3 results)  Recent Labs  10/19/15 0425  BNP 25.0    ProBNP (last 3 results) No results for input(s): PROBNP in the last 8760 hours.  CBG: No results for input(s): GLUCAP in the last 168 hours.  Recent Results (from the past 240 hour(s))  MRSA PCR Screening     Status: None   Collection Time: 10/19/15  1:00 PM  Result Value Ref Range Status   MRSA by PCR NEGATIVE NEGATIVE Final    Comment:        The GeneXpert MRSA Assay (FDA approved for NASAL specimens only), is one component of a comprehensive MRSA colonization surveillance program. It is not intended to diagnose MRSA infection nor to guide or monitor treatment for MRSA infections.      Studies: Dg Chest Portable 1 View  10/19/2015  CLINICAL DATA:  Acute onset of shortness of breath. Initial encounter. EXAM: PORTABLE CHEST 1 VIEW COMPARISON:  Chest radiograph performed 07/15/2004 FINDINGS: The lungs are well-aerated. Mild vascular congestion is noted. Minimal bibasilar atelectasis is seen. There is no evidence of pleural effusion or  pneumothorax. The cardiomediastinal silhouette is within normal limits. No acute osseous abnormalities are seen. IMPRESSION: Mild vascular congestion noted.  Minimal bibasilar atelectasis seen. Electronically Signed   By: Garald Balding M.D.   On: 10/19/2015 05:33    Scheduled Meds: . amLODipine  5 mg Oral Daily  . enoxaparin (LOVENOX) injection  40 mg Subcutaneous Q24H  . ipratropium-albuterol  3 mL Nebulization Q6H  . levofloxacin  750 mg Oral Daily  . methylPREDNISolone (SOLU-MEDROL) injection  60 mg Intravenous Q6H  . metoprolol succinate  100 mg Oral Daily  . mometasone-formoterol  2 puff  Inhalation BID  . oseltamivir  75 mg Oral BID  . sodium chloride flush  3 mL Intravenous Q12H   Continuous Infusions:   Principal Problem:   COPD exacerbation (HCC) Active Problems:   HTN (hypertension)   Acute respiratory failure with hypoxia ([place])    Time spent: 25 minutes. Greater than 50% of this time was spent in direct contact with the patient coordinating care.    Lelon Frohlich  [facility] Pager [phone]  If 7PM-7AM, please contact night-coverage at [email or web address], password [facility] 10/20/2015, 4:20 PM  LOS: 1 day

## 2015-10-21 DIAGNOSIS — J449 Chronic obstructive pulmonary disease, unspecified: Secondary | ICD-10-CM | POA: Diagnosis not present

## 2015-10-21 DIAGNOSIS — I10 Essential (primary) hypertension: Secondary | ICD-10-CM | POA: Diagnosis not present

## 2015-10-21 DIAGNOSIS — J9601 Acute respiratory failure with hypoxia: Secondary | ICD-10-CM | POA: Diagnosis not present

## 2015-10-21 DIAGNOSIS — J441 Chronic obstructive pulmonary disease with (acute) exacerbation: Secondary | ICD-10-CM | POA: Diagnosis not present

## 2015-10-21 MED ORDER — PREDNISONE 10 MG PO TABS: 10.0000 mg | ORAL_TABLET | Freq: Every day | ORAL | Status: DC

## 2015-10-21 MED ORDER — TIOTROPIUM BROMIDE MONOHYDRATE 18 MCG IN CAPS: 18.0000 ug | ORAL_CAPSULE | Freq: Every day | RESPIRATORY_TRACT | Status: DC

## 2015-10-21 MED ORDER — LEVOFLOXACIN 750 MG PO TABS: 750.0000 mg | ORAL_TABLET | Freq: Every day | ORAL | Status: DC

## 2015-10-21 NOTE — Progress Notes (Addendum)
CM able to speak with patient regarding oxygen.  She will need oxygen to use for transporting home and set up for home use. CM sent Dr. Jerilee Hoh message regarding oxygen and order received.   CM faxed paperwork to [facility] for processing.  CM contacted [facility] to provide insurance card information also faxed copy for the oxygen delivery.

## 2015-10-21 NOTE — Progress Notes (Signed)
SATURATION QUALIFICATIONS: (This note is used to comply with regulatory documentation for home oxygen)  Patient Saturations on Room Air at Rest = 85%  Patient Saturations on Room Air while Ambulating = not  done  Patient Saturations on 2Liters of oxygen while Ambulating = 95% then to 80%  Please briefly explain why patient needs home oxygen:  The patient became severely SOB with ambulation even on 2 LPM.  Her sats were low before the start of ambulation and dropped more with the actual ambulation.

## 2015-10-21 NOTE — Discharge Summary (Signed)
Physician Discharge Summary  Debbie [name] [id number] DOB: 05/25/1946 DOA: 10/19/2015  PCP: Wende Neighbors, MD  Admit date: 10/19/2015 Discharge date: 10/21/2015  Time spent: 45 minutes  Recommendations for Outpatient Follow-up:  -Will be discharged home today. -Advised to follow up with PCP in 2 weeks.   Discharge Diagnoses:  Principal Problem:   COPD exacerbation ([place]) Active Problems:   HTN (hypertension)   Acute respiratory failure with hypoxia [facility])   Discharge Condition: Stable and improved  Filed Weights   10/19/15 0421 10/19/15 0827  Weight: 58.06 kg (128 lb) 58.2 kg (128 lb 4.9 oz)    History of present illness:  Patient is a 70 year old woman with history of COPD and hypertension who presents to the hospital today with a 2 day history of worsening shortness of breath. She endorses cough and chills, has not actually taken her temperature. She has been using her albuterol at home without relief. She denies any chest pain. She is hypoxemic and is requiring supplemental oxygen where she does not wear oxygen at home. She has failed to improve with treatment in the emergency department and we are asked to admit for further evaluation and management.   Hospital Course:   Acute hypoxemic respiratory failure -Due to COPD with acute exacerbation.  -Oxygen assessment to be performed prior to DC and will be arranged for if she qualifies. -Please see below for details.  COPD with acute exacerbation -Improved today, no wheezing with good air movement. -Continue levaquin for 3 more days, prednisone taper. -Will add spiriva to symbicort to optimize her COPD regimen. -Flu PCR is negative.  Hypertension -Fair control, continue current regimen  Procedures:  None   Consultations:  None  Discharge Instructions  Discharge Instructions    Diet - low sodium heart healthy    Complete by:  As directed      Increase activity slowly    Complete by:  As directed             Medication List    TAKE these medications        albuterol 108 (90 Base) MCG/ACT inhaler  Commonly known as:  PROVENTIL HFA;VENTOLIN HFA  Inhale 1 puff into the lungs every 6 (six) hours as needed for wheezing or shortness of breath.     amLODipine 5 MG tablet  Commonly known as:  NORVASC  Take 5 mg by mouth daily.     ipratropium-albuterol 0.5-2.5 (3) MG/3ML Soln  Commonly known as:  DUONEB  Take 3 mLs by nebulization every 6 (six) hours as needed (shortness of breath/wheezing).     levofloxacin 750 MG tablet  Commonly known as:  LEVAQUIN  Take 1 tablet (750 mg total) by mouth daily.     metoprolol succinate 100 MG 24 hr tablet  Commonly known as:  TOPROL-XL  Take 100 mg by mouth daily. Take with or immediately following a meal.     predniSONE 10 MG tablet  Commonly known as:  DELTASONE  Take 1 tablet (10 mg total) by mouth daily with breakfast. Take 6 tablets today and then decrease by 1 tablet daily until none are left.     SYMBICORT 160-4.5 MCG/ACT inhaler  Generic drug:  budesonide-formoterol  Inhale 1 puff into the lungs 2 (two) times daily.     tiotropium 18 MCG inhalation capsule  Commonly known as:  SPIRIVA HANDIHALER  Place 1 capsule (18 mcg total) into inhaler and inhale daily.       No Known Allergies  Follow-up Information    Follow up with Wende Neighbors, MD. Schedule an appointment as soon as possible for a visit in 2 weeks.   Specialty:  Internal Medicine   Contact information:   [place] [address] [phone]        The results of significant diagnostics from this hospitalization (including imaging, microbiology, ancillary and laboratory) are listed below for reference.    Significant Diagnostic Studies: Dg Chest Portable 1 View  10/19/2015  CLINICAL DATA:  Acute onset of shortness of breath. Initial encounter. EXAM: PORTABLE CHEST 1 VIEW COMPARISON:  Chest radiograph performed 07/15/2004 FINDINGS: The lungs are  well-aerated. Mild vascular congestion is noted. Minimal bibasilar atelectasis is seen. There is no evidence of pleural effusion or pneumothorax. The cardiomediastinal silhouette is within normal limits. No acute osseous abnormalities are seen. IMPRESSION: Mild vascular congestion noted.  Minimal bibasilar atelectasis seen. Electronically Signed   By: Garald Balding M.D.   On: 10/19/2015 05:33   Dg Finger Thumb Left  10/11/2015  CLINICAL DATA:  Injury of the distal aspect of the thumb in the 1980s; intermittent inflammatory change since that time with the most recent episode lasting 2 months; evaluate for foreign body EXAM: LEFT THUMB 2+V COMPARISON:  None in PACs FINDINGS: The bones of the left thumb are adequately mineralized. There is no lytic or blastic lesion or periosteal reaction. There is radiodense material within the nail. No radiodense foreign body is observed. The joint spaces are preserved. IMPRESSION: There is no evidence of acute or chronic osteomyelitis. No definite radiopaque foreign body in the soft tissues is observed. There is increased density that appears to lie within the nail Electronically Signed   By: David  Martinique M.D.   On: 10/11/2015 10:10    Microbiology: Recent Results (from the past 240 hour(s))  MRSA PCR Screening     Status: None   Collection Time: 10/19/15  1:00 PM  Result Value Ref Range Status   MRSA by PCR NEGATIVE NEGATIVE Final    Comment:        The GeneXpert MRSA Assay (FDA approved for NASAL specimens only), is one component of a comprehensive MRSA colonization surveillance program. It is not intended to diagnose MRSA infection nor to guide or monitor treatment for MRSA infections.      Labs: Basic Metabolic Panel:  Recent Labs Lab 10/19/15 0425 10/20/15 0611  NA 139 141  K 4.3 4.0  CL 101 101  CO2 28 29  GLUCOSE 138* 146*  BUN 8 11  CREATININE 0.67 0.60  CALCIUM 9.1 9.5   Liver Function Tests: No results for input(s): AST, ALT,  ALKPHOS, BILITOT, PROT, ALBUMIN in the last 168 hours. No results for input(s): LIPASE, AMYLASE in the last 168 hours. No results for input(s): AMMONIA in the last 168 hours. CBC:  Recent Labs Lab 10/19/15 0425 10/20/15 0611  WBC 6.7 8.8  NEUTROABS 4.3  --   HGB 14.7 14.8  HCT 44.5 45.0  MCV 94.1 94.5  PLT 194 223   Cardiac Enzymes:  Recent Labs Lab 10/19/15 0425  TROPONINI <0.03   BNP: BNP (last 3 results)  Recent Labs  10/19/15 0425  BNP 25.0    ProBNP (last 3 results) No results for input(s): PROBNP in the last 8760 hours.  CBG: No results for input(s): GLUCAP in the last 168 hours.     SignedLelon Frohlich  [facility] Pager: [phone] 10/21/2015, 11:17 AM

## 2015-10-22 NOTE — Progress Notes (Signed)
Patient discharged with instructions, prescription, and care notes.  Verbalized understanding via teach back.  IV was removed and the site was WNL. Patient voiced no further complaints or concerns at the time of discharge.  Appointments scheduled per instructions.  Patient left the floor via w/c with staff and family in stable condition.  Patient had on portable O2 and her sats were 94% on 3 LPM

## 2015-11-08 DIAGNOSIS — R600 Localized edema: Secondary | ICD-10-CM | POA: Diagnosis not present

## 2015-11-08 DIAGNOSIS — J449 Chronic obstructive pulmonary disease, unspecified: Secondary | ICD-10-CM | POA: Diagnosis not present

## 2015-11-16 DIAGNOSIS — L570 Actinic keratosis: Secondary | ICD-10-CM | POA: Diagnosis not present

## 2015-11-18 DIAGNOSIS — J449 Chronic obstructive pulmonary disease, unspecified: Secondary | ICD-10-CM | POA: Diagnosis not present

## 2015-11-22 ENCOUNTER — Encounter (HOSPITAL_COMMUNITY): Payer: Self-pay

## 2015-11-22 ENCOUNTER — Inpatient Hospital Stay (HOSPITAL_COMMUNITY)
Admission: EM | Admit: 2015-11-22 | Discharge: 2015-11-26 | DRG: 190 | Disposition: A | Discharge status: Home / Self Care | Payer: Medicare Other | Attending: Internal Medicine | Admitting: Internal Medicine

## 2015-11-22 DIAGNOSIS — Z9981 Dependence on supplemental oxygen: Secondary | ICD-10-CM

## 2015-11-22 DIAGNOSIS — I5033 Acute on chronic diastolic (congestive) heart failure: Secondary | ICD-10-CM | POA: Diagnosis present

## 2015-11-22 DIAGNOSIS — J440 Chronic obstructive pulmonary disease with acute lower respiratory infection: Principal | ICD-10-CM | POA: Diagnosis present

## 2015-11-22 DIAGNOSIS — I110 Hypertensive heart disease with heart failure: Secondary | ICD-10-CM | POA: Diagnosis present

## 2015-11-22 DIAGNOSIS — J441 Chronic obstructive pulmonary disease with (acute) exacerbation: Secondary | ICD-10-CM

## 2015-11-22 DIAGNOSIS — Z87891 Personal history of nicotine dependence: Secondary | ICD-10-CM

## 2015-11-22 DIAGNOSIS — B029 Zoster without complications: Secondary | ICD-10-CM | POA: Diagnosis not present

## 2015-11-22 DIAGNOSIS — I10 Essential (primary) hypertension: Secondary | ICD-10-CM | POA: Diagnosis not present

## 2015-11-22 DIAGNOSIS — Y95 Nosocomial condition: Secondary | ICD-10-CM | POA: Diagnosis present

## 2015-11-22 DIAGNOSIS — R0600 Dyspnea, unspecified: Secondary | ICD-10-CM | POA: Diagnosis not present

## 2015-11-22 DIAGNOSIS — J9620 Acute and chronic respiratory failure, unspecified whether with hypoxia or hypercapnia: Secondary | ICD-10-CM | POA: Diagnosis present

## 2015-11-22 DIAGNOSIS — J189 Pneumonia, unspecified organism: Secondary | ICD-10-CM | POA: Diagnosis present

## 2015-11-22 DIAGNOSIS — R0602 Shortness of breath: Secondary | ICD-10-CM | POA: Diagnosis not present

## 2015-11-22 HISTORY — DX: Zoster without complications: B02.9

## 2015-11-22 NOTE — ED Notes (Addendum)
Pt reports taking one extra valtrex pill 1 gram , one extra gabapentin 100 mg, and one extra puff of albuterol inhaler.  Diagnosed with Shingles last Friday. Now pt reports feeling shaky and anxious.

## 2015-11-23 ENCOUNTER — Encounter (HOSPITAL_COMMUNITY): Payer: Self-pay | Admitting: Internal Medicine

## 2015-11-23 ENCOUNTER — Emergency Department (HOSPITAL_COMMUNITY): Payer: Medicare Other

## 2015-11-23 ENCOUNTER — Inpatient Hospital Stay (HOSPITAL_COMMUNITY): Payer: Medicare Other

## 2015-11-23 DIAGNOSIS — J440 Chronic obstructive pulmonary disease with acute lower respiratory infection: Secondary | ICD-10-CM | POA: Diagnosis present

## 2015-11-23 DIAGNOSIS — I110 Hypertensive heart disease with heart failure: Secondary | ICD-10-CM | POA: Diagnosis present

## 2015-11-23 DIAGNOSIS — I10 Essential (primary) hypertension: Secondary | ICD-10-CM

## 2015-11-23 DIAGNOSIS — J189 Pneumonia, unspecified organism: Secondary | ICD-10-CM | POA: Diagnosis not present

## 2015-11-23 DIAGNOSIS — R0600 Dyspnea, unspecified: Secondary | ICD-10-CM | POA: Diagnosis not present

## 2015-11-23 DIAGNOSIS — Z87891 Personal history of nicotine dependence: Secondary | ICD-10-CM | POA: Diagnosis not present

## 2015-11-23 DIAGNOSIS — J9620 Acute and chronic respiratory failure, unspecified whether with hypoxia or hypercapnia: Secondary | ICD-10-CM | POA: Diagnosis present

## 2015-11-23 DIAGNOSIS — I5033 Acute on chronic diastolic (congestive) heart failure: Secondary | ICD-10-CM | POA: Diagnosis present

## 2015-11-23 DIAGNOSIS — Z9981 Dependence on supplemental oxygen: Secondary | ICD-10-CM | POA: Diagnosis not present

## 2015-11-23 DIAGNOSIS — B029 Zoster without complications: Secondary | ICD-10-CM

## 2015-11-23 DIAGNOSIS — Y95 Nosocomial condition: Secondary | ICD-10-CM | POA: Diagnosis present

## 2015-11-23 DIAGNOSIS — J441 Chronic obstructive pulmonary disease with (acute) exacerbation: Secondary | ICD-10-CM | POA: Diagnosis not present

## 2015-11-23 DIAGNOSIS — R0602 Shortness of breath: Secondary | ICD-10-CM | POA: Diagnosis not present

## 2015-11-23 LAB — CBC WITH DIFFERENTIAL/PLATELET
BASOS ABS: 0 10*3/uL (ref 0.0–0.1)
BASOS PCT: 0 %
EOS PCT: 1 %
Eosinophils Absolute: 0.2 10*3/uL (ref 0.0–0.7)
HEMATOCRIT: 39.7 % (ref 36.0–46.0)
Hemoglobin: 13.2 g/dL (ref 12.0–15.0)
Lymphocytes Relative: 39 %
Lymphs Abs: 5.6 10*3/uL — ABNORMAL HIGH (ref 0.7–4.0)
MCH: 31.3 pg (ref 26.0–34.0)
MCHC: 33.2 g/dL (ref 30.0–36.0)
MCV: 94.1 fL (ref 78.0–100.0)
MONO ABS: 1.1 10*3/uL — AB (ref 0.1–1.0)
MONOS PCT: 7 %
NEUTROS ABS: 7.6 10*3/uL (ref 1.7–7.7)
Neutrophils Relative %: 53 %
PLATELETS: 363 10*3/uL (ref 150–400)
RBC: 4.22 MIL/uL (ref 3.87–5.11)
RDW: 13.5 % (ref 11.5–15.5)
WBC: 14.4 10*3/uL — ABNORMAL HIGH (ref 4.0–10.5)

## 2015-11-23 LAB — ECHOCARDIOGRAM COMPLETE
HEIGHTINCHES: 61 in
WEIGHTICAEL: 1952 [oz_av]

## 2015-11-23 LAB — BRAIN NATRIURETIC PEPTIDE: B Natriuretic Peptide: 29 pg/mL (ref 0.0–100.0)

## 2015-11-23 LAB — INFLUENZA PANEL BY PCR (TYPE A & B)
H1N1FLUPCR: NOT DETECTED
INFLBPCR: NEGATIVE
Influenza A By PCR: NEGATIVE

## 2015-11-23 LAB — BASIC METABOLIC PANEL
Anion gap: 10 (ref 5–15)
BUN: 11 mg/dL (ref 6–20)
CALCIUM: 8.8 mg/dL — AB (ref 8.9–10.3)
CO2: 28 mmol/L (ref 22–32)
CREATININE: 0.51 mg/dL (ref 0.44–1.00)
Chloride: 99 mmol/L — ABNORMAL LOW (ref 101–111)
GFR calc Af Amer: >60 mL/min (ref >60)
GLUCOSE: 96 mg/dL (ref 65–99)
Potassium: 3.5 mmol/L (ref 3.5–5.1)
Sodium: 137 mmol/L (ref 135–145)

## 2015-11-23 MED ORDER — FUROSEMIDE 10 MG/ML IJ SOLN
40.0000 mg | Freq: Two times a day (BID) | INTRAMUSCULAR | Status: DC
  Administered 2015-11-23: 40 mg via INTRAVENOUS
  Filled 2015-11-23: qty 4

## 2015-11-23 MED ORDER — ONDANSETRON HCL 4 MG PO TABS: 4.0000 mg | ORAL_TABLET | Freq: Four times a day (QID) | ORAL | Status: DC | PRN

## 2015-11-23 MED ORDER — ALBUTEROL SULFATE (2.5 MG/3ML) 0.083% IN NEBU: 2.5000 mg | INHALATION_SOLUTION | RESPIRATORY_TRACT | Status: DC | PRN

## 2015-11-23 MED ORDER — PIPERACILLIN-TAZOBACTAM 3.375 G IVPB 30 MIN
3.3750 g | Freq: Once | INTRAVENOUS | Status: AC
  Administered 2015-11-23: 3.375 g via INTRAVENOUS
  Filled 2015-11-23: qty 50

## 2015-11-23 MED ORDER — METHYLPREDNISOLONE SODIUM SUCC 125 MG IJ SOLR
125.0000 mg | Freq: Once | INTRAMUSCULAR | Status: AC
  Administered 2015-11-23: 125 mg via INTRAVENOUS
  Filled 2015-11-23: qty 2

## 2015-11-23 MED ORDER — VALACYCLOVIR HCL 500 MG PO TABS
ORAL_TABLET | ORAL | Status: AC
  Filled 2015-11-23: qty 2

## 2015-11-23 MED ORDER — PIPERACILLIN-TAZOBACTAM 3.375 G IVPB
3.3750 g | Freq: Three times a day (TID) | INTRAVENOUS | Status: DC
  Administered 2015-11-23 – 2015-11-26 (×10): 3.375 g via INTRAVENOUS
  Filled 2015-11-23 (×10): qty 50

## 2015-11-23 MED ORDER — AMLODIPINE BESYLATE 5 MG PO TABS
5.0000 mg | ORAL_TABLET | Freq: Every day | ORAL | Status: DC
  Filled 2015-11-23: qty 1

## 2015-11-23 MED ORDER — FUROSEMIDE 10 MG/ML IJ SOLN
40.0000 mg | Freq: Every day | INTRAMUSCULAR | Status: DC
  Administered 2015-11-24 – 2015-11-25 (×2): 40 mg via INTRAVENOUS
  Filled 2015-11-23 (×2): qty 4

## 2015-11-23 MED ORDER — IPRATROPIUM-ALBUTEROL 0.5-2.5 (3) MG/3ML IN SOLN
3.0000 mL | Freq: Four times a day (QID) | RESPIRATORY_TRACT | Status: DC
Start: 2015-11-23 — End: 2015-11-23
  Administered 2015-11-23: 3 mL via RESPIRATORY_TRACT
  Filled 2015-11-23: qty 3

## 2015-11-23 MED ORDER — ONDANSETRON HCL 4 MG/2ML IJ SOLN: 4.0000 mg | Freq: Four times a day (QID) | INTRAMUSCULAR | Status: DC | PRN

## 2015-11-23 MED ORDER — IPRATROPIUM BROMIDE 0.02 % IN SOLN
0.5000 mg | Freq: Once | RESPIRATORY_TRACT | Status: AC
  Administered 2015-11-23: 0.5 mg via RESPIRATORY_TRACT
  Filled 2015-11-23: qty 2.5

## 2015-11-23 MED ORDER — HEPARIN SODIUM (PORCINE) 5000 UNIT/ML IJ SOLN
5000.0000 [IU] | Freq: Three times a day (TID) | INTRAMUSCULAR | Status: DC
  Administered 2015-11-23 – 2015-11-26 (×10): 5000 [IU] via SUBCUTANEOUS
  Filled 2015-11-23 (×9): qty 1

## 2015-11-23 MED ORDER — POTASSIUM CHLORIDE CRYS ER 20 MEQ PO TBCR
40.0000 meq | EXTENDED_RELEASE_TABLET | Freq: Two times a day (BID) | ORAL | Status: AC
  Administered 2015-11-23 (×2): 40 meq via ORAL
  Filled 2015-11-23 (×2): qty 2

## 2015-11-23 MED ORDER — METOPROLOL SUCCINATE ER 50 MG PO TB24
100.0000 mg | ORAL_TABLET | Freq: Every day | ORAL | Status: DC
  Administered 2015-11-24 – 2015-11-26 (×3): 100 mg via ORAL
  Filled 2015-11-23 (×7): qty 2

## 2015-11-23 MED ORDER — IPRATROPIUM-ALBUTEROL 0.5-2.5 (3) MG/3ML IN SOLN
3.0000 mL | Freq: Four times a day (QID) | RESPIRATORY_TRACT | Status: DC
  Administered 2015-11-24 – 2015-11-26 (×8): 3 mL via RESPIRATORY_TRACT
  Filled 2015-11-23 (×8): qty 3

## 2015-11-23 MED ORDER — SODIUM CHLORIDE 0.9 % IV BOLUS (SEPSIS): 1000.0000 mL | Freq: Once | INTRAVENOUS | Status: DC

## 2015-11-23 MED ORDER — VANCOMYCIN HCL 500 MG IV SOLR
500.0000 mg | Freq: Two times a day (BID) | INTRAVENOUS | Status: DC
  Administered 2015-11-23 – 2015-11-26 (×6): 500 mg via INTRAVENOUS
  Filled 2015-11-23 (×9): qty 500

## 2015-11-23 MED ORDER — ALBUTEROL SULFATE (2.5 MG/3ML) 0.083% IN NEBU
2.5000 mg | INHALATION_SOLUTION | Freq: Four times a day (QID) | RESPIRATORY_TRACT | Status: DC
  Administered 2015-11-23 (×2): 2.5 mg via RESPIRATORY_TRACT
  Filled 2015-11-23 (×2): qty 3

## 2015-11-23 MED ORDER — METHYLPREDNISOLONE SODIUM SUCC 40 MG IJ SOLR
40.0000 mg | Freq: Four times a day (QID) | INTRAMUSCULAR | Status: DC
  Administered 2015-11-23 – 2015-11-24 (×6): 40 mg via INTRAVENOUS
  Filled 2015-11-23 (×6): qty 1

## 2015-11-23 MED ORDER — MOMETASONE FURO-FORMOTEROL FUM 200-5 MCG/ACT IN AERO
2.0000 | INHALATION_SPRAY | Freq: Two times a day (BID) | RESPIRATORY_TRACT | Status: DC
  Administered 2015-11-23 – 2015-11-26 (×6): 2 via RESPIRATORY_TRACT
  Filled 2015-11-23: qty 8.8

## 2015-11-23 MED ORDER — VANCOMYCIN HCL IN DEXTROSE 1-5 GM/200ML-% IV SOLN
1000.0000 mg | Freq: Once | INTRAVENOUS | Status: AC
  Administered 2015-11-23: 1000 mg via INTRAVENOUS
  Filled 2015-11-23: qty 200

## 2015-11-23 MED ORDER — VALACYCLOVIR HCL 500 MG PO TABS
1000.0000 mg | ORAL_TABLET | Freq: Three times a day (TID) | ORAL | Status: DC
  Administered 2015-11-23 – 2015-11-26 (×10): 1000 mg via ORAL
  Filled 2015-11-23 (×17): qty 2

## 2015-11-23 MED ORDER — ALBUTEROL SULFATE (2.5 MG/3ML) 0.083% IN NEBU
5.0000 mg | INHALATION_SOLUTION | Freq: Once | RESPIRATORY_TRACT | Status: AC
  Administered 2015-11-23: 5 mg via RESPIRATORY_TRACT
  Filled 2015-11-23: qty 6

## 2015-11-23 NOTE — Progress Notes (Signed)
*  PRELIMINARY RESULTS* Echocardiogram 2D Echocardiogram has been performed.  Leavy Cella 11/23/2015, 4:51 PM

## 2015-11-23 NOTE — Progress Notes (Addendum)
[facility] PROGRESS NOTE  Debbie [name] [id number] DOB: 18-May-1946 DOA: 11/22/2015 PCP: Debbie Neighbors, MD  Assessment/Plan: Debbie [name] is an 70 y.o. female with a hx of COPD, HTN, and was recently diagnosed with shingles last Friday by her PCP, Debbie [name]. She presents to the ED with complaints of dizziness, nausea, SOB after taking extra pills of her regular medications.She also reports feeling shaky and anxious before she took extra doses of her Valtrex, gabapentin, and albuterol inhaler. She admits to experiencing a productive cough with white sputum and normally wears O2 at home, which helps her breathing. She has a decreased appetite but has been drinking normally. Sh does not smoke anymore and denies any associated fever or SOB.  She is followed by Debbie [name] as an outpatient for pulmonology.  While in the ED, workup showed BNP unremarkable. WBC 14.4. CXR showed mild congestive changes and small bilateral pleural effusions. Developing PNA is not excluded. She is afebfrile and her vital signs are stable. Hospitalist was asked to admit for management of COPD exacerbation with possible HCAP. Her last hospitalization was in Feb for COPD exacerbation, when she was sent home on oxygen.    1-Acute on chronic COPD exacerbation; Continue with nebulizer treatments, IV solumedrol.   2-Health care associated PNA;  continue with IV antibiotics.   3-Acute on chronic respiratory failure;  Might be related to PNA, Vs COPD. Also concern for pulmonary edema.  Will check ECHO.  Continue with Tx of PNA and COPD>   HTN; hold  Norvasc, SBP soft.   4-Zoster: resume valtrex. Improving.    Code Status: Full code.  Family Communication: care discussed with patient.  Disposition Plan: Remain inpatient.    Consultants:  none  Procedures:  none  Antibiotics:  Vancomycin 3-31  Zosyn 3-31  HPI/Subjective: She is still feeling SOB, nebulizer helps.   Objective: Filed Vitals:   11/23/15 0700 11/23/15 0717  BP: 108/58 108/58  Pulse: 81 80  Temp:  98 F (36.7 C)  Resp:  18    Intake/Output Summary (Last 24 hours) at 11/23/15 0808 Last data filed at 11/23/15 0450  Gross per 24 hour  Intake    258 ml  Output    600 ml  Net   -342 ml   Filed Weights   11/22/15 2244  Weight: 55.339 kg (122 lb)    Exam:   General:  Alert in no distress  Cardiovascular: S 1, S 2 RRR  Respiratory: Crackles BL  Abdomen: BS present, soft, nt  Musculoskeletal: no edema   Data Reviewed: Basic Metabolic Panel:  Recent Labs Lab 11/23/15 0018  NA 137  K 3.5  CL 99*  CO2 28  GLUCOSE 96  BUN 11  CREATININE 0.51  CALCIUM 8.8*   Liver Function Tests: No results for input(s): AST, ALT, ALKPHOS, BILITOT, PROT, ALBUMIN in the last 168 hours. No results for input(s): LIPASE, AMYLASE in the last 168 hours. No results for input(s): AMMONIA in the last 168 hours. CBC:  Recent Labs Lab 11/23/15 0018  WBC 14.4*  NEUTROABS 7.6  HGB 13.2  HCT 39.7  MCV 94.1  PLT 363   Cardiac Enzymes: No results for input(s): CKTOTAL, CKMB, CKMBINDEX, TROPONINI in the last 168 hours. BNP (last 3 results)  Recent Labs  10/19/15 0425 11/23/15 0018  BNP 25.0 29.0    ProBNP (last 3 results) No results for input(s): PROBNP in the last 8760 hours.  CBG: No results for input(s): GLUCAP in the last  168 hours.  No results found for this or any previous visit (from the past 240 hour(s)).   Studies: Dg Chest Port 1 View  11/23/2015  CLINICAL DATA:  70 year old female with shortness of breath EXAM: PORTABLE CHEST 1 VIEW COMPARISON:  Chest radiograph dated 10/19/2015 FINDINGS: Single-view of the chest demonstrate mild emphysematous changes of the lungs with mild vascular congestion. Bibasilar hazy densities, likely atelectasis/scarring. Pneumonia is less likely but not excluded. There is no pneumothorax with There small bilateral pleural effusions, right greater than left. The  cardiac silhouette is within normal limits with no acute osseous pathology. IMPRESSION: Mild congestive changes and small bilateral pleural effusions. Bibasilar atelectatic changes.  Developing pneumonia not excluded. Electronically Signed   By: Anner Crete M.D.   On: 11/23/2015 01:13    Scheduled Meds: . albuterol  2.5 mg Nebulization Q6H  . amLODipine  5 mg Oral Daily  . furosemide  40 mg Intravenous BID  . heparin  5,000 Units Subcutaneous 3 times per day  . methylPREDNISolone (SOLU-MEDROL) injection  40 mg Intravenous Q6H  . metoprolol succinate  100 mg Oral Daily  . mometasone-formoterol  2 puff Inhalation BID   Continuous Infusions:   Principal Problem:   HCAP (healthcare-associated pneumonia) Active Problems:   COPD exacerbation (HCC)   HTN (hypertension)   Shingles    Time spent: 35 minutes.     Niel Hummer A  [facility] Pager [phone] If 7PM-7AM, please contact night-coverage at [email or web address], password [facility] 11/23/2015, 8:08 AM  LOS: 0 days

## 2015-11-23 NOTE — ED Provider Notes (Signed)
CSN: [id number]     Arrival date & time 11/22/15  2237 History  By signing my name below, I, [facility], attest that this documentation has been prepared under the direction and in the presence of Rolland Porter, MD at [place] AM. Electronically Signed: Virgel Bouquet, ED Scribe. 11/23/2015. 2:28 AM.   Chief Complaint  Patient presents with  . Drug Overdose   The history is provided by the patient. No language interpreter was used.   HPI Comments: Debbie [name] is a 70 y.o. female with a PMHx of COPD, HTN, shingles on home O2 (3L/min) who presents to the Emergency Department after a drug overdose that occurred 6 hours ago. Patient reports that she became dizzy earlier today followed by diarrhea, nausea, and left-sided abdominal pain. She states that she did not become shaky or anxious until 6 hours ago before she took extra pills of her valtrex and gabapentin, and one extra puff of her albuterol inhaler. She states she started feeling panicky in the middle of the day and she states she feels "scary" however she cannot tell what is scaring her. She endorses nausea which is now gone, diarrhea 3x today, abdominal pain that is now resolved, cough that she notes is baseline for her. She has been eating less today but drinking normally. Per patient, she took extra pills 6 hours ago when he anxiety worsened. Denies vomiting, fever, SOB.  PCP Dr Merlyn Albert Pulmonary Dr Luan Pulling  Past Medical History  Diagnosis Date  . COPD (chronic obstructive pulmonary disease) ([place])   . Hypertension   . Rheumatic fever   . Shingles    Past Surgical History  Procedure Laterality Date  . Cholecystectomy     No family history on file. Social History  Substance Use Topics  . Smoking status: Former Research scientist (life sciences)  . Smokeless tobacco: None  . Alcohol Use: No   Lives at home  Uses oxygen 3 lpm [place] 24/7 Quit smoking 20 years ago Lives alone No second hand smoke  OB History    No data available     Review of  Systems  Constitutional: Negative for fever.  Respiratory: Positive for cough (baseline). Negative for shortness of breath.   Gastrointestinal: Positive for diarrhea. Negative for nausea (completely resolved), vomiting and abdominal pain (completely resolved).  Neurological: Positive for dizziness.  All other systems reviewed and are negative.     Allergies  Review of patient's allergies indicates no known allergies.  Home Medications   Prior to Admission medications   Medication Sig Start Date End Date Taking? Authorizing Provider  albuterol (PROVENTIL HFA;VENTOLIN HFA) 108 (90 Base) MCG/ACT inhaler Inhale 1 puff into the lungs every 6 (six) hours as needed for wheezing or shortness of breath.    Historical Provider, MD  amLODipine (NORVASC) 5 MG tablet Take 5 mg by mouth daily. 10/17/15   Historical Provider, MD  ipratropium-albuterol (DUONEB) 0.5-2.5 (3) MG/3ML SOLN Take 3 mLs by nebulization every 6 (six) hours as needed (shortness of breath/wheezing).     Historical Provider, MD  levofloxacin (LEVAQUIN) 750 MG tablet Take 1 tablet (750 mg total) by mouth daily. 10/21/15   Erline Hau, MD  metoprolol succinate (TOPROL-XL) 100 MG 24 hr tablet Take 100 mg by mouth daily. Take with or immediately following a meal.    Historical Provider, MD  predniSONE (DELTASONE) 10 MG tablet Take 1 tablet (10 mg total) by mouth daily with breakfast. Take 6 tablets today and then decrease by 1 tablet daily  until none are left. 10/21/15   Erline Hau, MD  SYMBICORT 160-4.5 MCG/ACT inhaler Inhale 1 puff into the lungs 2 (two) times daily. 09/28/15   Historical Provider, MD  tiotropium (SPIRIVA HANDIHALER) 18 MCG inhalation capsule Place 1 capsule (18 mcg total) into inhaler and inhale daily. 10/21/15   Erline Hau, MD   BP 126/62 mmHg  Pulse 94  Temp(Src) 98 F (36.7 C) (Oral)  Resp 20  Ht 5\' 1"  (1.549 m)  Wt 122 lb (55.339 kg)  BMI 23.06 kg/m2  SpO2 97%  Vital  signs normal   Physical Exam  Constitutional: She is oriented to person, place, and time. She appears well-developed and well-nourished.  Non-toxic appearance. She does not appear ill. No distress.  HENT:  Head: Normocephalic and atraumatic.  Right Ear: External ear normal.  Left Ear: External ear normal.  Nose: Nose normal. No mucosal edema or rhinorrhea.  Mouth/Throat: Oropharynx is clear and moist and mucous membranes are normal. No dental abscesses or uvula swelling.  Eyes: Conjunctivae and EOM are normal. Pupils are equal, round, and reactive to light.  Neck: Normal range of motion and full passive range of motion without pain. Neck supple.  Cardiovascular: Normal rate, regular rhythm and normal heart sounds.  Exam reveals no gallop and no friction rub.   No murmur heard. Pulmonary/Chest: Accessory muscle usage present. Tachypnea noted. She is in respiratory distress. She has decreased breath sounds. She has no wheezes. She has no rhonchi. She has no rales. She exhibits no tenderness and no crepitus.  Frequent cough. No breath sounds with deep breathing, tachypneic at rest.  Abdominal: Soft. Normal appearance and bowel sounds are normal. She exhibits no distension. There is no tenderness. There is no rebound and no guarding.  Musculoskeletal: Normal range of motion. She exhibits no edema or tenderness.  Moves all extremities well.   Neurological: She is alert and oriented to person, place, and time. She has normal strength. No cranial nerve deficit.  Skin: Skin is warm, dry and intact. No rash noted. No erythema. No pallor.  Psychiatric: Her speech is normal and behavior is normal. Her mood appears anxious.  Nursing note and vitals reviewed.   ED Course  Procedures  Medications  vancomycin (VANCOCIN) IVPB 1000 mg/200 mL premix (not administered)  piperacillin-tazobactam (ZOSYN) IVPB 3.375 g (3.375 g Intravenous New Bag/Given 11/23/15 0207)  methylPREDNISolone sodium succinate  (SOLU-MEDROL) 125 mg/2 mL injection 125 mg (not administered)  albuterol (PROVENTIL) (2.5 MG/3ML) 0.083% nebulizer solution 5 mg (5 mg Nebulization Given 11/23/15 0015)  ipratropium (ATROVENT) nebulizer solution 0.5 mg (0.5 mg Nebulization Given 11/23/15 0015)     DIAGNOSTIC STUDIES: Oxygen Saturation is 93% on 3L/min, low by my interpretation.    COORDINATION OF CARE: 12:04 AM Will order breathing treatment, labs, chest x-ray. Discussed treatment plan with pt at bedside and pt agreed to plan.  After reviewing patient's chest x-ray and her BNP results she was started on healthcare associated pneumonia antibiotics since she was last admitted about 5 weeks ago for pulmonary problems. Although her x-ray is being read as possible increased pulmonary vascularity her BNP is totally normal.  Recheck at 0200 AM after nebulizer treatment, pt now has some air movement, still appears SOB. We discussed her xray results and recommend she be admitted and she is agreeable.   02:25 Dr Marin Comment, will admit   Labs Review Results for orders placed or performed during the hospital encounter of [id number]  Basic metabolic panel  Result Value Ref Range   Sodium 137 135 - 145 mmol/L   Potassium 3.5 3.5 - 5.1 mmol/L   Chloride 99 (L) 101 - 111 mmol/L   CO2 28 22 - 32 mmol/L   Glucose, Bld 96 65 - 99 mg/dL   BUN 11 6 - 20 mg/dL   Creatinine, Ser 0.51 0.44 - 1.00 mg/dL   Calcium 8.8 (L) 8.9 - 10.3 mg/dL   GFR calc non Af Amer >60 >60 mL/min   GFR calc Af Amer >60 >60 mL/min   Anion gap 10 5 - 15  CBC with Differential  Result Value Ref Range   WBC 14.4 (H) 4.0 - 10.5 K/uL   RBC 4.22 3.87 - 5.11 MIL/uL   Hemoglobin 13.2 12.0 - 15.0 g/dL   HCT 39.7 36.0 - 46.0 %   MCV 94.1 78.0 - 100.0 fL   MCH 31.3 26.0 - 34.0 pg   MCHC 33.2 30.0 - 36.0 g/dL   RDW 13.5 11.5 - 15.5 %   Platelets 363 150 - 400 K/uL   Neutrophils Relative % 53 %   Neutro Abs 7.6 1.7 - 7.7 K/uL   Lymphocytes Relative 39 %   Lymphs Abs 5.6 (H)  0.7 - 4.0 K/uL   Monocytes Relative 7 %   Monocytes Absolute 1.1 (H) 0.1 - 1.0 K/uL   Eosinophils Relative 1 %   Eosinophils Absolute 0.2 0.0 - 0.7 K/uL   Basophils Relative 0 %   Basophils Absolute 0.0 0.0 - 0.1 K/uL  Brain natriuretic peptide  Result Value Ref Range   B Natriuretic Peptide 29.0 0.0 - 100.0 pg/mL    Laboratory interpretation all normal except leukocytosis    Imaging Review Dg Chest Port 1 View  11/23/2015  CLINICAL DATA:  70 year old female with shortness of breath EXAM: PORTABLE CHEST 1 VIEW COMPARISON:  Chest radiograph dated 10/19/2015 FINDINGS: Single-view of the chest demonstrate mild emphysematous changes of the lungs with mild vascular congestion. Bibasilar hazy densities, likely atelectasis/scarring. Pneumonia is less likely but not excluded. There is no pneumothorax with There small bilateral pleural effusions, right greater than left. The cardiac silhouette is within normal limits with no acute osseous pathology. IMPRESSION: Mild congestive changes and small bilateral pleural effusions. Bibasilar atelectatic changes.  Developing pneumonia not excluded. Electronically Signed   By: Anner Crete M.D.   On: 11/23/2015 01:13   I have personally reviewed and evaluated these images and lab results as part of my medical decision-making.   EKG Interpretation None      MDM   Final diagnoses:  Healthcare-associated pneumonia  COPD exacerbation ([place])   Plan admission   Rolland Porter, MD, FACEP   I personally performed the services described in this documentation, which was scribed in my presence. The recorded information has been reviewed and considered.  Rolland Porter, MD, Barbette Or, MD 11/23/15 [phone]

## 2015-11-23 NOTE — ED Notes (Signed)
Pt no distress .  Lung sounds clear.  No edema.  States she still feels a little sob.  Denies any pain.    A few red spots to right side of face.

## 2015-11-23 NOTE — ED Notes (Signed)
Pt resting.  No distress.  States she is feeling better.

## 2015-11-23 NOTE — ED Notes (Signed)
Pt has used the bedpan 4 times.  Bed sheets wet.  Changed linen.

## 2015-11-23 NOTE — H&P (Signed)
[facility] History and Physical  Debbie [name] [id number] DOB: 09/02/1945    PCP:   Wende Neighbors, MD   Chief Complaint: SOB and weakness.   HPI: CHADSITY Debbie [name] is an 70 y.o. female with a hx of COPD, HTN, and was recently diagnosed with shingles last Friday by her PCP, Dr. Nevada Crane. She presents to the ED with complaints of dizziness, nausea, SOB after taking extra pills of her regular medications.She also reports feeling shaky and anxious before she took extra doses of her Valtrex, gabapentin, and albuterol inhaler. She admits to experiencing a productive cough with white sputum and normally wears O2 at home, which helps her breathing. She has a decreased appetite but has been drinking normally. Sh does not smoke anymore and denies any associated fever or SOB.  She is followed by Dr. Luan Pulling as an outpatient for pulmonology.  While in the ED, workup showed BNP unremarkable. WBC 14.4. CXR showed mild congestive changes and small bilateral pleural effusions. Developing PNA is not excluded. She is afebfrile and her vital signs are stable. Hospitalist was asked to admit for management of COPD exacerbation with possible HCAP. Her last hospitalization was in Feb for COPD exacerbation, when she was sent home on oxygen.   Rewiew of Systems:  Constitutional: Negative for malaise, fever and chills. No significant weight loss or weight gain Eyes: Negative for eye pain, redness and discharge, diplopia, visual changes, or flashes of light. ENMT: Negative for ear pain, hoarseness, nasal congestion, sinus pressure and sore throat. No headaches; tinnitus, drooling, or problem swallowing. Cardiovascular: Negative for chest pain, palpitations, diaphoresis, dyspnea and peripheral edema. ; No orthopnea, PND Respiratory: Negative for hemoptysis, wheezing and stridor. No pleuritic chestpain. Positive for cough Gastrointestinal: Negative for vomiting,constipation, melena, blood in stool, hematemesis, jaundice and  rectal bleeding.   Positive for nausea, diarrhea, and abd pain. Genitourinary: Negative for frequency, dysuria, incontinence,flank pain and hematuria; Musculoskeletal: Negative for back pain and neck pain. Negative for swelling and trauma.;  Skin: . Negative for pruritus, rash, abrasions, bruising and skin lesion.; ulcerations Neuro: Negative for headache, lightheadedness and neck stiffness. Negative for weakness, altered level of consciousness , altered mental status, extremity weakness, burning feet, involuntary movement, seizure and syncope.  Psych: negative for anxiety, depression, insomnia, tearfulness, panic attacks, hallucinations, paranoia, suicidal or homicidal ideation    Past Medical History  Diagnosis Date  . COPD (chronic obstructive pulmonary disease) ([place])   . Hypertension   . Rheumatic fever   . Shingles     Past Surgical History  Procedure Laterality Date  . Cholecystectomy      Medications:  HOME MEDS: Prior to Admission medications   Medication Sig Start Date End Date Taking? Authorizing Provider  albuterol (PROVENTIL HFA;VENTOLIN HFA) 108 (90 Base) MCG/ACT inhaler Inhale 1 puff into the lungs every 6 (six) hours as needed for wheezing or shortness of breath.    Historical Provider, MD  amLODipine (NORVASC) 5 MG tablet Take 5 mg by mouth daily. 10/17/15   Historical Provider, MD  ipratropium-albuterol (DUONEB) 0.5-2.5 (3) MG/3ML SOLN Take 3 mLs by nebulization every 6 (six) hours as needed (shortness of breath/wheezing).     Historical Provider, MD  levofloxacin (LEVAQUIN) 750 MG tablet Take 1 tablet (750 mg total) by mouth daily. 10/21/15   Erline Hau, MD  metoprolol succinate (TOPROL-XL) 100 MG 24 hr tablet Take 100 mg by mouth daily. Take with or immediately following a meal.    Historical Provider, MD  predniSONE (DELTASONE)  10 MG tablet Take 1 tablet (10 mg total) by mouth daily with breakfast. Take 6 tablets today and then decrease by 1 tablet  daily until none are left. 10/21/15   Erline Hau, MD  SYMBICORT 160-4.5 MCG/ACT inhaler Inhale 1 puff into the lungs 2 (two) times daily. 09/28/15   Historical Provider, MD  tiotropium (SPIRIVA HANDIHALER) 18 MCG inhalation capsule Place 1 capsule (18 mcg total) into inhaler and inhale daily. 10/21/15   Erline Hau, MD     Allergies:  No Known Allergies  Social History:   reports that she has quit smoking. She does not have any smokeless tobacco history on file. She reports that she does not drink alcohol or use illicit drugs.  Family History: No family history on file.   Physical Exam: Filed Vitals:   11/23/15 0000 11/23/15 0015 11/23/15 0030 11/23/15 0130  BP: 138/72  140/79 126/62  Pulse: 94  106 94  Temp:      TempSrc:      Resp: 18  22 20   Height:      Weight:      SpO2: 97% 97% 93% 97%   Blood pressure 126/62, pulse 94, temperature 98 F (36.7 C), temperature source Oral, resp. rate 20, height 5\' 1"  (1.549 m), weight 55.339 kg (122 lb), SpO2 97 %.  GEN:  Pleasant, patient lying in the stretcher in no acute distress; cooperative with exam. PSYCH:  alert and oriented x4; does not appear anxious or depressed; affect is appropriate. HEENT: Mucous membranes pink and anicteric; PERRLA; EOM intact; no cervical lymphadenopathy nor thyromegaly or carotid bruit; no JVD; There were no stridor. Neck is very supple. Breasts:: Not examined CHEST WALL: No tenderness CHEST: Normal respiration, bibasilar crackles and minimal wheezing.  HEART: Regular rate and rhythm.  There are no murmur, rub, or gallops.   BACK: No kyphosis or scoliosis; no CVA tenderness ABDOMEN: soft and non-tender; no masses, no organomegaly, normal abdominal bowel sounds; no pannus; no intertriginous candida. There is no rebound and no distention. Rectal Exam: Not done EXTREMITIES: No bone or joint deformity; age-appropriate arthropathy of the hands and knees; no edema; no ulcerations.   There is no calf tenderness. Genitalia: not examined PULSES: 2+ and symmetric SKIN: Normal hydration no rash or ulceration CNS: Cranial nerves 2-12 grossly intact no focal lateralizing neurologic deficit.  Speech is fluent; uvula elevated with phonation, facial symmetry and tongue midline. DTR are normal bilaterally, cerebella exam is intact, barbinski is negative and strengths are equaled bilaterally.  No sensory loss.   Labs on Admission:  Basic Metabolic Panel:  Recent Labs Lab 11/23/15 0018  NA 137  K 3.5  CL 99*  CO2 28  GLUCOSE 96  BUN 11  CREATININE 0.51  CALCIUM 8.8*   CBC:  Recent Labs Lab 11/23/15 0018  WBC 14.4*  NEUTROABS 7.6  HGB 13.2  HCT 39.7  MCV 94.1  PLT 363   Radiological Exams on Admission: Dg Chest Port 1 View  11/23/2015  CLINICAL DATA:  70 year old female with shortness of breath EXAM: PORTABLE CHEST 1 VIEW COMPARISON:  Chest radiograph dated 10/19/2015 FINDINGS: Single-view of the chest demonstrate mild emphysematous changes of the lungs with mild vascular congestion. Bibasilar hazy densities, likely atelectasis/scarring. Pneumonia is less likely but not excluded. There is no pneumothorax with There small bilateral pleural effusions, right greater than left. The cardiac silhouette is within normal limits with no acute osseous pathology. IMPRESSION: Mild congestive changes and small bilateral pleural  effusions. Bibasilar atelectatic changes.  Developing pneumonia not excluded. Electronically Signed   By: Anner Crete M.D.   On: 11/23/2015 01:13    EKG: Independently reviewed.    Assessment/Plan Present on Admission:  . HCAP (healthcare-associated pneumonia) . COPD exacerbation ([place]) . HTN (hypertension) . Shingles   1. COPD exacerbation. CXR showed mild congestive changes and small bilateral pleural effusions. Developing PNA not excluded. Will start on nebs, steroids, and abx. Will give IV diuretics as she has bisilar rales. 2. HCAP. WBC  14.4. Start on abx 3. HTN. Continue anti-hypertensive meds 4. Shingles. Continue current meds.  Will place her on airborn precaution per hospital policy, though I don't think she is infectious.     Other plans as per orders. Code Status: Full   Orvan Falconer, MD.FACP.  [facility] Pager [phone] 7pm to 7am.  11/23/2015, 2:27 AM  By signing my name below, I, Delene Ruffini, attest that this documentation has been prepared under the direction and in the presence of Orvan Falconer, MD. Electronically Signed: Delene Ruffini, Scribe 11/23/2015 2:30am

## 2015-11-23 NOTE — Progress Notes (Signed)
Pharmacy Antibiotic Note  SEVIN LEIBEL is a 70 y.o. female admitted on 11/22/2015 with drug overdose.  Pharmacy has been consulted for vancomycin and zosyn for PNA. Vanc 1 gm and zosyn 3.375 gm given earlier in ED  Plan: Cont vanc 500 mg IV q12 hours Cont zosyn 3.375 gm IV q8 hours F/u renal function, cultures and clinical course  Height: 5\' 1"  (154.9 cm) Weight: 122 lb (55.339 kg) IBW/kg (Calculated) : 47.8  Temp (24hrs), Avg:98 F (36.7 C), Min:98 F (36.7 C), Max:98 F (36.7 C)   Recent Labs Lab 11/23/15 0018  WBC 14.4*  CREATININE 0.51    Estimated Creatinine Clearance: 50.8 mL/min (by C-G formula based on Cr of 0.51).    No Known Allergies  Antimicrobials this admission: 3/31 vanc  >>  3/31  zosyn >>    Thank you for allowing pharmacy to be a part of this patient's care.  Excell Seltzer [place] 11/23/2015 8:23 AM

## 2015-11-23 NOTE — Care Management Important Message (Signed)
Important Message  Patient Details  Name: ADANYA KITTRELL MRN: [id number] Date of Birth: 1946-02-16   Medicare Important Message Given:  Yes    Alvie Heidelberg, RN 11/23/2015, 12:13 PM

## 2015-11-24 DIAGNOSIS — J441 Chronic obstructive pulmonary disease with (acute) exacerbation: Secondary | ICD-10-CM

## 2015-11-24 LAB — CBC
HCT: 43.8 % (ref 36.0–46.0)
Hemoglobin: 14.7 g/dL (ref 12.0–15.0)
MCH: 32 pg (ref 26.0–34.0)
MCHC: 33.6 g/dL (ref 30.0–36.0)
MCV: 95.2 fL (ref 78.0–100.0)
Platelets: 431 10*3/uL — ABNORMAL HIGH (ref 150–400)
RBC: 4.6 MIL/uL (ref 3.87–5.11)
RDW: 14.3 % (ref 11.5–15.5)
WBC: 23.7 10*3/uL — ABNORMAL HIGH (ref 4.0–10.5)

## 2015-11-24 LAB — BASIC METABOLIC PANEL
Anion gap: 13 (ref 5–15)
BUN: 25 mg/dL — AB (ref 6–20)
CALCIUM: 9.5 mg/dL (ref 8.9–10.3)
CO2: 29 mmol/L (ref 22–32)
Chloride: 98 mmol/L — ABNORMAL LOW (ref 101–111)
Creatinine, Ser: 0.98 mg/dL (ref 0.44–1.00)
GFR calc Af Amer: >60 mL/min (ref >60)
GFR, EST NON AFRICAN AMERICAN: 58 mL/min — AB (ref >60)
GLUCOSE: 164 mg/dL — AB (ref 65–99)
Potassium: 3.9 mmol/L (ref 3.5–5.1)
Sodium: 140 mmol/L (ref 135–145)

## 2015-11-24 MED ORDER — METHYLPREDNISOLONE SODIUM SUCC 40 MG IJ SOLR
40.0000 mg | Freq: Two times a day (BID) | INTRAMUSCULAR | Status: DC
  Administered 2015-11-25: 40 mg via INTRAVENOUS
  Filled 2015-11-24: qty 1

## 2015-11-24 MED ORDER — VANCOMYCIN HCL 500 MG IV SOLR
INTRAVENOUS | Status: AC
  Filled 2015-11-24: qty 500

## 2015-11-24 MED ORDER — POTASSIUM CHLORIDE CRYS ER 20 MEQ PO TBCR
20.0000 meq | EXTENDED_RELEASE_TABLET | Freq: Once | ORAL | Status: AC
  Administered 2015-11-24: 20 meq via ORAL
  Filled 2015-11-24: qty 1

## 2015-11-24 NOTE — Progress Notes (Signed)
[facility] PROGRESS NOTE  NELLIEL MARROW [id number] DOB: 03/22/1946 DOA: 11/22/2015 PCP: Wende Neighbors, MD  Assessment/Plan: Debbie [name] is an 70 y.o. female with a hx of COPD, HTN, and was recently diagnosed with shingles last Friday by her PCP, Dr. Nevada Crane. She presents to the ED with complaints of dizziness, nausea, SOB after taking extra pills of her regular medications.She also reports feeling shaky and anxious before she took extra doses of her Valtrex, gabapentin, and albuterol inhaler. She admits to experiencing a productive cough with white sputum and normally wears O2 at home, which helps her breathing. She has a decreased appetite but has been drinking normally. Sh does not smoke anymore and denies any associated fever or SOB.  She is followed by Dr. Luan Pulling as an outpatient for pulmonology.  While in the ED, workup showed BNP unremarkable. WBC 14.4. CXR showed mild congestive changes and small bilateral pleural effusions. Developing PNA is not excluded. She is afebfrile and her vital signs are stable. Hospitalist was asked to admit for management of COPD exacerbation with possible HCAP. Her last hospitalization was in Feb for COPD exacerbation, when she was sent home on oxygen.    1-Acute on chronic COPD exacerbation; Continue with nebulizer treatments, IV solumedrol, change to BID.   2-Health care associated PNA;  continue with IV antibiotics. Vancomycin and zosyn.  Suspect elevation of WBC related to steroids.   3-Acute on chronic respiratory failure; Acute diastolic HF exacerbation Might be related to PNA, Vs COPD. Also concern for pulmonary edema.  ECHO diastolic disfunction Continue with Tx of PNA and COPD>  Continue with IV lasix.  Daily weight. Strict i and o/   HTN; hold  Norvasc, SBP soft.   4-Zoster: resume valtrex. Improving.    Code Status: Full code.  Family Communication: care discussed with patient.  Disposition Plan: Remain inpatient.     Consultants:  none  Procedures:  none  Antibiotics:  Vancomycin 3-31  Zosyn 3-31  HPI/Subjective: She is still feeling,  SOB improved. , nebulizer helps.   Objective: Filed Vitals:   11/23/15 2202 11/24/15 0653  BP: 120/69 107/59  Pulse: 100 82  Temp: 98 F (36.7 C) 98.6 F (37 C)  Resp: 20 16   No intake or output data in the 24 hours ending 11/24/15 1622 Filed Weights   11/22/15 2244  Weight: 55.339 kg (122 lb)    Exam:   General:  Alert in no distress,   Cardiovascular: S 1, S 2 RRR  Respiratory: Crackles BL  Abdomen: BS present, soft, nt  Musculoskeletal: no edema  Skin; rash right side face and neck improved.    Data Reviewed: Basic Metabolic Panel:  Recent Labs Lab 11/23/15 0018 11/24/15 1418  NA 137 140  K 3.5 3.9  CL 99* 98*  CO2 28 29  GLUCOSE 96 164*  BUN 11 25*  CREATININE 0.51 0.98  CALCIUM 8.8* 9.5   Liver Function Tests: No results for input(s): AST, ALT, ALKPHOS, BILITOT, PROT, ALBUMIN in the last 168 hours. No results for input(s): LIPASE, AMYLASE in the last 168 hours. No results for input(s): AMMONIA in the last 168 hours. CBC:  Recent Labs Lab 11/23/15 0018 11/24/15 1418  WBC 14.4* 23.7*  NEUTROABS 7.6  --   HGB 13.2 14.7  HCT 39.7 43.8  MCV 94.1 95.2  PLT 363 431*   Cardiac Enzymes: No results for input(s): CKTOTAL, CKMB, CKMBINDEX, TROPONINI in the last 168 hours. BNP (last 3 results)  Recent Labs  10/19/15 0425 11/23/15 0018  BNP 25.0 29.0    ProBNP (last 3 results) No results for input(s): PROBNP in the last 8760 hours.  CBG: No results for input(s): GLUCAP in the last 168 hours.  No results found for this or any previous visit (from the past 240 hour(s)).   Studies: Dg Chest Port 1 View  11/23/2015  CLINICAL DATA:  70 year old female with shortness of breath EXAM: PORTABLE CHEST 1 VIEW COMPARISON:  Chest radiograph dated 10/19/2015 FINDINGS: Single-view of the chest demonstrate mild  emphysematous changes of the lungs with mild vascular congestion. Bibasilar hazy densities, likely atelectasis/scarring. Pneumonia is less likely but not excluded. There is no pneumothorax with There small bilateral pleural effusions, right greater than left. The cardiac silhouette is within normal limits with no acute osseous pathology. IMPRESSION: Mild congestive changes and small bilateral pleural effusions. Bibasilar atelectatic changes.  Developing pneumonia not excluded. Electronically Signed   By: Anner Crete M.D.   On: 11/23/2015 01:13    Scheduled Meds: . furosemide  40 mg Intravenous Daily  . heparin  5,000 Units Subcutaneous 3 times per day  . ipratropium-albuterol  3 mL Nebulization QID  . [START ON 11/25/2015] methylPREDNISolone (SOLU-MEDROL) injection  40 mg Intravenous Q12H  . metoprolol succinate  100 mg Oral Daily  . mometasone-formoterol  2 puff Inhalation BID  . piperacillin-tazobactam (ZOSYN)  IV  3.375 g Intravenous Q8H  . potassium chloride  20 mEq Oral Once  . valACYclovir  1,000 mg Oral TID  . vancomycin  500 mg Intravenous Q12H   Continuous Infusions:   Principal Problem:   HCAP (healthcare-associated pneumonia) Active Problems:   COPD exacerbation (HCC)   HTN (hypertension)   Shingles    Time spent: 25 minutes.     Niel Hummer A  [facility] Pager [phone] If 7PM-7AM, please contact night-coverage at [email or web address], password [facility] 11/24/2015, 4:22 PM  LOS: 1 day

## 2015-11-25 LAB — CBC
HCT: 41.5 % (ref 36.0–46.0)
Hemoglobin: 13.8 g/dL (ref 12.0–15.0)
MCH: 31.7 pg (ref 26.0–34.0)
MCHC: 33.3 g/dL (ref 30.0–36.0)
MCV: 95.4 fL (ref 78.0–100.0)
PLATELETS: 424 10*3/uL — AB (ref 150–400)
RBC: 4.35 MIL/uL (ref 3.87–5.11)
RDW: 14.5 % (ref 11.5–15.5)
WBC: 23.4 10*3/uL — AB (ref 4.0–10.5)

## 2015-11-25 LAB — BASIC METABOLIC PANEL
ANION GAP: 12 (ref 5–15)
BUN: 26 mg/dL — AB (ref 6–20)
CALCIUM: 9.4 mg/dL (ref 8.9–10.3)
CO2: 30 mmol/L (ref 22–32)
CREATININE: 0.76 mg/dL (ref 0.44–1.00)
Chloride: 100 mmol/L — ABNORMAL LOW (ref 101–111)
GFR calc Af Amer: >60 mL/min (ref >60)
GLUCOSE: 132 mg/dL — AB (ref 65–99)
Potassium: 4.2 mmol/L (ref 3.5–5.1)
Sodium: 142 mmol/L (ref 135–145)

## 2015-11-25 MED ORDER — DM-GUAIFENESIN ER 30-600 MG PO TB12
1.0000 | ORAL_TABLET | Freq: Two times a day (BID) | ORAL | Status: DC
  Administered 2015-11-25 – 2015-11-26 (×3): 1 via ORAL
  Filled 2015-11-25 (×3): qty 1

## 2015-11-25 MED ORDER — FUROSEMIDE 20 MG PO TABS
20.0000 mg | ORAL_TABLET | Freq: Every day | ORAL | Status: DC
  Administered 2015-11-26: 20 mg via ORAL
  Filled 2015-11-25: qty 1

## 2015-11-25 MED ORDER — PREDNISONE 20 MG PO TABS
60.0000 mg | ORAL_TABLET | Freq: Every day | ORAL | Status: DC
  Administered 2015-11-25 – 2015-11-26 (×2): 60 mg via ORAL
  Filled 2015-11-25 (×2): qty 3

## 2015-11-25 NOTE — Progress Notes (Signed)
[facility] PROGRESS NOTE  Debbie [name] [id number] DOB: 01/13/1946 DOA: 11/22/2015 PCP: Wende Neighbors, MD  Assessment/Plan: Debbie [name] is an 70 y.o. female with a hx of COPD, HTN, and was recently diagnosed with shingles last Friday by her PCP, Dr. Nevada Crane. She presents to the ED with complaints of dizziness, nausea, SOB after taking extra pills of her regular medications.She also reports feeling shaky and anxious before she took extra doses of her Valtrex, gabapentin, and albuterol inhaler. She admits to experiencing a productive cough with white sputum and normally wears O2 at home, which helps her breathing. She has a decreased appetite but has been drinking normally. Sh does not smoke anymore and denies any associated fever or SOB.  She is followed by Dr. Luan Pulling as an outpatient for pulmonology.  While in the ED, workup showed BNP unremarkable. WBC 14.4. CXR showed mild congestive changes and small bilateral pleural effusions. Developing PNA is not excluded. She is afebfrile and her vital signs are stable. Hospitalist was asked to admit for management of COPD exacerbation with possible HCAP. Her last hospitalization was in Feb for COPD exacerbation, when she was sent home on oxygen.    1-Acute on chronic COPD exacerbation; Continue with nebulizer treatments, IV solumedrol, change to oral prednisone  2-Health care associated PNA;  continue with IV antibiotics. Vancomycin and zosyn.  Suspect elevation of WBC related to steroids.   3-Acute on chronic respiratory failure; Acute diastolic HF exacerbation Might be related to PNA, Vs COPD. Also concern for pulmonary edema.  ECHO diastolic disfunction Continue with Tx of PNA and COPD>  Continue with IV lasix.  Daily weight. Strict i and o/  Weight 55 Kg.   HTN; hold  Norvasc, SBP soft.   4-Zoster: resume valtrex. Improving.    Code Status: Full code.  Family Communication: care discussed with patient.  Disposition Plan: Remain  inpatient.    Consultants:  none  Procedures:  none  Antibiotics:  Vancomycin 3-31  Zosyn 3-31  HPI/Subjective: Feeling better but need something to loose secretions.   Objective: Filed Vitals:   11/24/15 2016 11/25/15 0645  BP: 105/67 112/56  Pulse: 72 100  Temp: 97 F (36.1 C) 98.6 F (37 C)  Resp: 16 20    Intake/Output Summary (Last 24 hours) at 11/25/15 1541 Last data filed at 11/25/15 1338  Gross per 24 hour  Intake    360 ml  Output    300 ml  Net     60 ml   Filed Weights   11/22/15 2244 11/25/15 0700  Weight: 55.339 kg (122 lb) 55.2 kg (121 lb 11.1 oz)    Exam:   General:  Alert in no distress,   Cardiovascular: S 1, S 2 RRR  Respiratory: Crackles BL  Abdomen: BS present, soft, nt  Musculoskeletal: no edema  Skin; rash right side face and neck improved.    Data Reviewed: Basic Metabolic Panel:  Recent Labs Lab 11/23/15 0018 11/24/15 1418 11/25/15 0621  NA 137 140 142  K 3.5 3.9 4.2  CL 99* 98* 100*  CO2 28 29 30   GLUCOSE 96 164* 132*  BUN 11 25* 26*  CREATININE 0.51 0.98 0.76  CALCIUM 8.8* 9.5 9.4   Liver Function Tests: No results for input(s): AST, ALT, ALKPHOS, BILITOT, PROT, ALBUMIN in the last 168 hours. No results for input(s): LIPASE, AMYLASE in the last 168 hours. No results for input(s): AMMONIA in the last 168 hours. CBC:  Recent Labs Lab 11/23/15 0018  11/24/15 1418 11/25/15 0621  WBC 14.4* 23.7* 23.4*  NEUTROABS 7.6  --   --   HGB 13.2 14.7 13.8  HCT 39.7 43.8 41.5  MCV 94.1 95.2 95.4  PLT 363 431* 424*   Cardiac Enzymes: No results for input(s): CKTOTAL, CKMB, CKMBINDEX, TROPONINI in the last 168 hours. BNP (last 3 results)  Recent Labs  10/19/15 0425 11/23/15 0018  BNP 25.0 29.0    ProBNP (last 3 results) No results for input(s): PROBNP in the last 8760 hours.  CBG: No results for input(s): GLUCAP in the last 168 hours.  No results found for this or any previous visit (from the past  240 hour(s)).   Studies: No results found.  Scheduled Meds: . dextromethorphan-guaiFENesin  1 tablet Oral BID  . furosemide  40 mg Intravenous Daily  . heparin  5,000 Units Subcutaneous 3 times per day  . ipratropium-albuterol  3 mL Nebulization QID  . metoprolol succinate  100 mg Oral Daily  . mometasone-formoterol  2 puff Inhalation BID  . piperacillin-tazobactam (ZOSYN)  IV  3.375 g Intravenous Q8H  . predniSONE  60 mg Oral Q breakfast  . valACYclovir  1,000 mg Oral TID  . vancomycin  500 mg Intravenous Q12H   Continuous Infusions:   Principal Problem:   HCAP (healthcare-associated pneumonia) Active Problems:   COPD exacerbation (HCC)   HTN (hypertension)   Shingles    Time spent: 25 minutes.     Niel Hummer A  [facility] Pager [phone] If 7PM-7AM, please contact night-coverage at [email or web address], password [facility] 11/25/2015, 3:41 PM  LOS: 2 days

## 2015-11-26 LAB — CBC
HCT: 37.2 % (ref 36.0–46.0)
Hemoglobin: 12.5 g/dL (ref 12.0–15.0)
MCH: 31.6 pg (ref 26.0–34.0)
MCHC: 33.6 g/dL (ref 30.0–36.0)
MCV: 93.9 fL (ref 78.0–100.0)
PLATELETS: 316 10*3/uL (ref 150–400)
RBC: 3.96 MIL/uL (ref 3.87–5.11)
RDW: 14.4 % (ref 11.5–15.5)
WBC: 17.2 10*3/uL — AB (ref 4.0–10.5)

## 2015-11-26 MED ORDER — DM-GUAIFENESIN ER 30-600 MG PO TB12: 1.0000 | ORAL_TABLET | Freq: Two times a day (BID) | ORAL | Status: DC

## 2015-11-26 MED ORDER — PREDNISONE 20 MG PO TABS: 40.0000 mg | ORAL_TABLET | Freq: Every day | ORAL | Status: DC

## 2015-11-26 MED ORDER — FUROSEMIDE 20 MG PO TABS: 20.0000 mg | ORAL_TABLET | Freq: Every day | ORAL | Status: DC

## 2015-11-26 MED ORDER — LEVOFLOXACIN 750 MG PO TABS: 750.0000 mg | ORAL_TABLET | Freq: Every day | ORAL | Status: DC

## 2015-11-26 MED ORDER — VALACYCLOVIR HCL 1 G PO TABS: 1000.0000 mg | ORAL_TABLET | Freq: Three times a day (TID) | ORAL | Status: DC

## 2015-11-26 NOTE — Care Management Note (Signed)
Case Management Note  Patient Details  Name: Debbie [name] MRN: [id number] Date of Birth: 09-17-1945  Subjective/Objective:      Spoke with patient for discharge planning patient is from home and independent with the help of her 5 adult children. Stated that she does not use a walker or cane and that she has home O2 provided by [facility]. Patient was concerned that she would not be able to get her portable tank here at discharge . Arranged for portable to be delivered to room by [facility] prior to discharge.     Patient denies issues with home medications or transportation.          Action/Plan: Discharge home with self care.   Expected Discharge Date:                  Expected Discharge Plan:  Home/Self Care  In-House Referral:     Discharge planning Services  CM Consult  Post Acute Care Choice:    Choice offered to:     DME Arranged:    DME Agency:     HH Arranged:    [place] Agency:     Status of Service:  Completed, signed off  Medicare Important Message Given:  Yes Date Medicare IM Given:    Medicare IM give by:    Date Additional Medicare IM Given:    Additional Medicare Important Message give by:     If discussed at [place] of Stay Meetings, dates discussed:    Additional Comments:  Alvie Heidelberg, RN 11/26/2015, 9:28 AM

## 2015-11-26 NOTE — Discharge Summary (Signed)
Physician Discharge Summary  Debbie [name] [id number] DOB: 1945-12-11 DOA: 11/22/2015  PCP: Wende Neighbors, MD  Admit date: 11/22/2015 Discharge date: 11/26/2015  Time spent: 35 minutes  Recommendations for Outpatient Follow-up:  Follow resolution of zoster.  Needs repeat cbc and Bm,et  Follow resolution of PNA  Discharge Diagnoses:    HCAP (healthcare-associated pneumonia)   COPD exacerbation ([place])   HTN (hypertension)   Shingles   Acute on chronic diastolic dysfunction   Discharge Condition: stable  Diet recommendation: heart healthy   Filed Weights   11/22/15 2244 11/25/15 0700  Weight: 55.339 kg (122 lb) 55.2 kg (121 lb 11.1 oz)    History of present illness:  Debbie [name] is an 70 y.o. female with a hx of COPD, HTN, and was recently diagnosed with shingles last Friday by her PCP, Dr. Nevada Crane. She presents to the ED with complaints of dizziness, nausea, SOB after taking extra pills of her regular medications.She also reports feeling shaky and anxious before she took extra doses of her Valtrex, gabapentin, and albuterol inhaler. She admits to experiencing a productive cough with white sputum and normally wears O2 at home, which helps her breathing. She has a decreased appetite but has been drinking normally. Sh does not smoke anymore and denies any associated fever or SOB.  She is followed by Dr. Luan Pulling as an outpatient for pulmonology.  While in the ED, workup showed BNP unremarkable. WBC 14.4. CXR showed mild congestive changes and small bilateral pleural effusions. Developing PNA is not excluded. She is afebfrile and her vital signs are stable. Hospitalist was asked to admit for management of COPD exacerbation with possible HCAP. Her last hospitalization was in Feb for COPD exacerbation, when she was sent home on oxygen.   Hospital Course:  Assessment/Plan: Debbie [name] is an 70 y.o. female with a hx of COPD, HTN, and was recently diagnosed with shingles last Friday by her  PCP, Dr. Nevada Crane. She presents to the ED with complaints of dizziness, nausea, SOB after taking extra pills of her regular medications.She also reports feeling shaky and anxious before she took extra doses of her Valtrex, gabapentin, and albuterol inhaler. She admits to experiencing a productive cough with white sputum and normally wears O2 at home, which helps her breathing. She has a decreased appetite but has been drinking normally. Sh does not smoke anymore and denies any associated fever or SOB.  She is followed by Dr. Luan Pulling as an outpatient for pulmonology.  While in the ED, workup showed BNP unremarkable. WBC 14.4. CXR showed mild congestive changes and small bilateral pleural effusions. Developing PNA is not excluded. She is afebfrile and her vital signs are stable. Hospitalist was asked to admit for management of COPD exacerbation with possible HCAP. Her last hospitalization was in Feb for COPD exacerbation, when she was sent home on oxygen.    1-Acute on chronic COPD exacerbation; Continue with nebulizer treatments, IV solumedrol, change to oral prednisone Stable.   2-Health care associated PNA;  Received  Vancomycin and zosyn. for 5 days, discharge on oral antibiotics.  Suspect elevation of WBC related to steroids.   3-Acute on chronic respiratory failure; Acute diastolic HF exacerbation Might be related to PNA, Vs COPD. Also concern for pulmonary edema.  ECHO diastolic disfunction Continue with Tx of PNA and COPD>  Received IV lasix,. Discharge on low dose oral lasix.  Daily weight. Strict i and o/  Weight 55 Kg. --55  HTN; hold Norvasc, SBP soft.  4-Zoster: resume valtrex. Improving.   Procedures:  ECHO, diastolic disjunction grade 1.   Consultations:  none  Discharge Exam: Filed Vitals:   11/25/15 2025 11/26/15 0602  BP: 115/67 135/61  Pulse: 82 86  Temp: 98 F (36.7 C) 97.8 F (36.6 C)  Resp: 16 17    General: Alert in no distress Cardiovascular: S  1, S 2 RRR Respiratory: CTA  Discharge Instructions   Discharge Instructions    Diet - low sodium heart healthy    Complete by:  As directed      Increase activity slowly    Complete by:  As directed           Current Discharge Medication List    START taking these medications   Details  dextromethorphan-guaiFENesin (MUCINEX DM) 30-600 MG 12hr tablet Take 1 tablet by mouth 2 (two) times daily. Qty: 30 tablet, Refills: 0    furosemide (LASIX) 20 MG tablet Take 1 tablet (20 mg total) by mouth daily. Qty: 20 tablet, Refills: 0    levofloxacin (LEVAQUIN) 750 MG tablet Take 1 tablet (750 mg total) by mouth daily. Qty: 4 tablet, Refills: 0    predniSONE (DELTASONE) 20 MG tablet Take 2 tablets (40 mg total) by mouth daily with breakfast. Qty: 12 tablet, Refills: 0      CONTINUE these medications which have CHANGED   Details  valACYclovir (VALTREX) 1000 MG tablet Take 1 tablet (1,000 mg total) by mouth 3 (three) times daily. Qty: 6 tablet, Refills: 0      CONTINUE these medications which have NOT CHANGED   Details  acetaminophen (TYLENOL) 325 MG tablet Take 650 mg by mouth every 6 (six) hours as needed for moderate pain.    albuterol (PROVENTIL HFA;VENTOLIN HFA) 108 (90 Base) MCG/ACT inhaler Inhale 1 puff into the lungs every 6 (six) hours as needed for wheezing or shortness of breath.    Ascorbic Acid (VITAMIN C PO) Take 1 tablet by mouth daily.    gabapentin (NEURONTIN) 100 MG capsule Take 100 mg by mouth 3 (three) times daily. Refills: 0    ipratropium-albuterol (DUONEB) 0.5-2.5 (3) MG/3ML SOLN Take 3 mLs by nebulization every 6 (six) hours as needed (shortness of breath/wheezing).     metoprolol succinate (TOPROL-XL) 100 MG 24 hr tablet Take 100 mg by mouth daily. Take with or immediately following a meal.    SYMBICORT 160-4.5 MCG/ACT inhaler Inhale 1 puff into the lungs 2 (two) times daily.      STOP taking these medications     amLODipine (NORVASC) 5 MG tablet         No Known Allergies Follow-up Information    Follow up with Wende Neighbors, MD In 1 week.   Specialty:  Internal Medicine   Contact information:   [place] [address] [phone]        The results of significant diagnostics from this hospitalization (including imaging, microbiology, ancillary and laboratory) are listed below for reference.    Significant Diagnostic Studies: Dg Chest Port 1 View  11/23/2015  CLINICAL DATA:  70 year old female with shortness of breath EXAM: PORTABLE CHEST 1 VIEW COMPARISON:  Chest radiograph dated 10/19/2015 FINDINGS: Single-view of the chest demonstrate mild emphysematous changes of the lungs with mild vascular congestion. Bibasilar hazy densities, likely atelectasis/scarring. Pneumonia is less likely but not excluded. There is no pneumothorax with There small bilateral pleural effusions, right greater than left. The cardiac silhouette is within normal limits with no acute osseous pathology. IMPRESSION:  Mild congestive changes and small bilateral pleural effusions. Bibasilar atelectatic changes.  Developing pneumonia not excluded. Electronically Signed   By: Anner Crete M.D.   On: 11/23/2015 01:13    Microbiology: No results found for this or any previous visit (from the past 240 hour(s)).   Labs: Basic Metabolic Panel:  Recent Labs Lab 11/23/15 0018 11/24/15 1418 11/25/15 0621  NA 137 140 142  K 3.5 3.9 4.2  CL 99* 98* 100*  CO2 28 29 30   GLUCOSE 96 164* 132*  BUN 11 25* 26*  CREATININE 0.51 0.98 0.76  CALCIUM 8.8* 9.5 9.4   Liver Function Tests: No results for input(s): AST, ALT, ALKPHOS, BILITOT, PROT, ALBUMIN in the last 168 hours. No results for input(s): LIPASE, AMYLASE in the last 168 hours. No results for input(s): AMMONIA in the last 168 hours. CBC:  Recent Labs Lab 11/23/15 0018 11/24/15 1418 11/25/15 0621 11/26/15 0716  WBC 14.4* 23.7* 23.4* 17.2*  NEUTROABS 7.6  --   --   --   HGB 13.2  14.7 13.8 12.5  HCT 39.7 43.8 41.5 37.2  MCV 94.1 95.2 95.4 93.9  PLT 363 431* 424* 316   Cardiac Enzymes: No results for input(s): CKTOTAL, CKMB, CKMBINDEX, TROPONINI in the last 168 hours. BNP: BNP (last 3 results)  Recent Labs  10/19/15 0425 11/23/15 0018  BNP 25.0 29.0    ProBNP (last 3 results) No results for input(s): PROBNP in the last 8760 hours.  CBG: No results for input(s): GLUCAP in the last 168 hours.     Signed:  Elmarie Shiley MD.  [facility] 11/26/2015, 8:07 AM

## 2015-11-26 NOTE — Progress Notes (Signed)
Patient discharged with instructions, prescription, and care notes.  Verbalized understanding via teach back.  IV was removed and the site was WNL. Patient voiced no further complaints or concerns at the time of discharge.  Appointments scheduled per instructions.  Patient left the floor via w/c with staff and family in stable condition. 

## 2015-11-26 NOTE — Care Management Important Message (Signed)
Important Message  Patient Details  Name: Debbie [name] MRN: [id number] Date of Birth: 04-Oct-1945   Medicare Important Message Given:  Yes    Alvie Heidelberg, RN 11/26/2015, 8:26 AM

## 2015-12-06 DIAGNOSIS — B029 Zoster without complications: Secondary | ICD-10-CM | POA: Diagnosis not present

## 2015-12-06 DIAGNOSIS — I10 Essential (primary) hypertension: Secondary | ICD-10-CM | POA: Diagnosis not present

## 2015-12-06 DIAGNOSIS — H9209 Otalgia, unspecified ear: Secondary | ICD-10-CM | POA: Diagnosis not present

## 2015-12-06 DIAGNOSIS — H6500 Acute serous otitis media, unspecified ear: Secondary | ICD-10-CM | POA: Diagnosis not present

## 2015-12-19 DIAGNOSIS — J449 Chronic obstructive pulmonary disease, unspecified: Secondary | ICD-10-CM | POA: Diagnosis not present

## 2016-02-11 DIAGNOSIS — I10 Essential (primary) hypertension: Secondary | ICD-10-CM | POA: Diagnosis not present

## 2016-02-11 DIAGNOSIS — J449 Chronic obstructive pulmonary disease, unspecified: Secondary | ICD-10-CM | POA: Diagnosis not present

## 2016-02-22 ENCOUNTER — Encounter: Payer: Self-pay | Admitting: *Deleted

## 2016-02-22 ENCOUNTER — Ambulatory Visit (INDEPENDENT_AMBULATORY_CARE_PROVIDER_SITE_OTHER): Payer: Medicare Other | Admitting: Internal Medicine

## 2016-02-22 ENCOUNTER — Encounter: Payer: Self-pay | Admitting: Internal Medicine

## 2016-02-22 VITALS — BP 126/74 | HR 77 | Ht 61.0 in | Wt 121.0 lb

## 2016-02-22 DIAGNOSIS — R079 Chest pain, unspecified: Secondary | ICD-10-CM | POA: Diagnosis not present

## 2016-02-22 NOTE — Patient Instructions (Addendum)
Your physician recommends that you schedule a follow-up appointment to be determined.   Your physician has requested that you have a dobutamine myoview. For furth information please visit [email or web address]. Please follow instruction sheet, as given.    If you need a refill on your cardiac medications before your next appointment, please call your pharmacy.  Your physician recommends that you continue on your current medications as directed. Please refer to the Current Medication list given to you today.  Thank you for choosing [facility]!

## 2016-02-22 NOTE — Progress Notes (Signed)
Cardiology Office Note   Date:  02/22/2016   ID:  Debbie [name], DOB 09-24-1945, MRN [id number]  PCP:  Wende Neighbors, MD  Cardiologist:   Dorris Carnes, MD   Patient referred for chest pressure     History of Present Illness: Debbie [name] is a 70 y.o. female with a history of chest pain  Seen in internal medicine  Also a history of HTN and COPD   Pt has mid chest discmfort  Occurs With and without activity  Not daily  Takes alkaseltzer and goes away  No bitter taste in mouth  Started in March   Spells occur 3x per wk  Short lived  No change in breathing   Is always short of breath   Does note that she is slowing down with activity  Takes longer to do things   Had echo done in March LVEF 65 to [id number]  Gr I diastolic dysfunciton   March 2017 had shingles and PNA   Hosp in Feb for COPD exacerbation  Sent home on oxygen    Quit tobacco yers ago      Outpatient Prescriptions Prior to Visit  Medication Sig Dispense Refill  . acetaminophen (TYLENOL) 325 MG tablet Take 650 mg by mouth every 6 (six) hours as needed for moderate pain.    Marland Kitchen albuterol (PROVENTIL HFA;VENTOLIN HFA) 108 (90 Base) MCG/ACT inhaler Inhale 1 puff into the lungs every 6 (six) hours as needed for wheezing or shortness of breath.    . Ascorbic Acid (VITAMIN C PO) Take 1 tablet by mouth daily.    Marland Kitchen dextromethorphan-guaiFENesin (MUCINEX DM) 30-600 MG 12hr tablet Take 1 tablet by mouth 2 (two) times daily. 30 tablet 0  . furosemide (LASIX) 20 MG tablet Take 1 tablet (20 mg total) by mouth daily. 20 tablet 0  . gabapentin (NEURONTIN) 100 MG capsule Take 100 mg by mouth 3 (three) times daily.  0  . ipratropium-albuterol (DUONEB) 0.5-2.5 (3) MG/3ML SOLN Take 3 mLs by nebulization every 6 (six) hours as needed (shortness of breath/wheezing).     . metoprolol succinate (TOPROL-XL) 100 MG 24 hr tablet Take 100 mg by mouth daily. Take with or immediately following a meal.    . SYMBICORT 160-4.5 MCG/ACT inhaler Inhale 1 puff into the  lungs 2 (two) times daily.    . valACYclovir (VALTREX) 1000 MG tablet Take 1 tablet (1,000 mg total) by mouth 3 (three) times daily. 6 tablet 0  . levofloxacin (LEVAQUIN) 750 MG tablet Take 1 tablet (750 mg total) by mouth daily. 4 tablet 0  . predniSONE (DELTASONE) 20 MG tablet Take 2 tablets (40 mg total) by mouth daily with breakfast. 12 tablet 0   No facility-administered medications prior to visit.     Allergies:   Review of patient's allergies indicates no known allergies.   Past Medical History  Diagnosis Date  . COPD (chronic obstructive pulmonary disease) ([place])   . Hypertension   . Rheumatic fever   . Shingles     Past Surgical History  Procedure Laterality Date  . Cholecystectomy       Social History:  The patient  reports that she has quit smoking. She does not have any smokeless tobacco history on file. She reports that she does not drink alcohol or use illicit drugs.   Family History:  The patient's family history is not on file.    ROS:  Please see the history of present illness. All other systems are reviewed and  Negative to the above problem except as noted.    PHYSICAL EXAM: VS:  BP 126/74 mmHg  Pulse 77  Ht 5\' 1"  (1.549 m)  Wt 121 lb (54.885 kg)  BMI 22.87 kg/m2  SpO2 99%  GEN: Thin 70 yo  in no acute distress HEENT: normal Neck: no JVD, carotid bruits, or masses Cardiac: RRR; no murmurs, rubs, or gallops,no edema  Respiratory: Decreased airflow bilaterally   GI: soft, nontender, nondistended, + BS  No hepatomegaly  MS: no deformity Moving all extremities   Skin: warm and dry, no rash Neuro:  Strength and sensation are intact Psych: euthymic mood, full affect   EKG:  EKG is not  ordered today. ON 10/19/15  ST 108 bpm     Lipid Panel No results found for: CHOL, TRIG, HDL, CHOLHDL, VLDL, LDLCALC, LDLDIRECT    Wt Readings from Last 3 Encounters:  02/22/16 121 lb (54.885 kg)  11/25/15 121 lb 11.1 oz (55.2 kg)  10/19/15 128 lb 4.9 oz (58.2  kg)      ASSESSMENT AND PLAN:  1  Chest pressure  Pt has interimtt chest pressure for the last few months  Appers atypical for cardiac  But she does note increased fatigueability, takes her longer to do things  She does have COPD  I would recomm a dobutamine myovue to r/o ischemia  2  HTN  Adequate control    3  COPD  Follows in IM  4  HCM  LDL on last check in Feb 2016 was 79  Follow  HDL was 73    F/U will be based on test results       Signed, Dorris Carnes, MD  02/22/2016 2:38 PM    [address], [place], [place]  [address] Phone: [phone]; Fax: [phone]

## 2016-03-03 DIAGNOSIS — R079 Chest pain, unspecified: Secondary | ICD-10-CM | POA: Diagnosis not present

## 2016-03-04 ENCOUNTER — Encounter (HOSPITAL_COMMUNITY)
Admission: RE | Admit: 2016-03-04 | Discharge: 2016-03-04 | Disposition: A | Discharge status: Home / Self Care | Payer: Medicare Other | Source: Ambulatory Visit | Attending: Internal Medicine | Admitting: Internal Medicine

## 2016-03-04 ENCOUNTER — Inpatient Hospital Stay (HOSPITAL_COMMUNITY): Admission: RE | Admit: 2016-03-04 | Payer: Self-pay | Source: Ambulatory Visit

## 2016-03-04 ENCOUNTER — Encounter (HOSPITAL_COMMUNITY): Payer: Self-pay

## 2016-03-04 DIAGNOSIS — I510 Cardiac septal defect, acquired: Secondary | ICD-10-CM | POA: Diagnosis not present

## 2016-03-04 DIAGNOSIS — R079 Chest pain, unspecified: Secondary | ICD-10-CM | POA: Insufficient documentation

## 2016-03-04 LAB — NM MYOCAR MULTI W/SPECT W/WALL MOTION / EF
CHL CUP NUCLEAR SSS: 3
CHL CUP RESTING HR STRESS: 82 {beats}/min
CSEPPHR: 136 {beats}/min
LV dias vol: 47 mL (ref 46–106)
LVSYSVOL: 9 mL
NUC STRESS TID: 1.12
RATE: 0.55
SDS: 2
SRS: 1

## 2016-03-04 MED ORDER — DOBUTAMINE IN D5W 4-5 MG/ML-% IV SOLN
INTRAVENOUS | Status: AC
Start: 2016-03-04 — End: 2016-03-04
  Administered 2016-03-04: 1000000 ug via INTRAVENOUS
  Administered 2016-03-04: 40 ug/kg/min via INTRAVENOUS
  Filled 2016-03-04: qty 250

## 2016-03-04 MED ORDER — SODIUM CHLORIDE 0.9% FLUSH
INTRAVENOUS | Status: AC
  Administered 2016-03-04: 10 mL via INTRAVENOUS
  Filled 2016-03-04: qty 10

## 2016-03-04 MED ORDER — TECHNETIUM TC 99M TETROFOSMIN IV KIT
30.0000 | PACK | Freq: Once | INTRAVENOUS | Status: AC | PRN
  Administered 2016-03-04: 30 via INTRAVENOUS

## 2016-03-04 MED ORDER — TECHNETIUM TC 99M TETROFOSMIN IV KIT
10.0000 | PACK | Freq: Once | INTRAVENOUS | Status: AC | PRN
  Administered 2016-03-04: 10 via INTRAVENOUS

## 2016-03-05 ENCOUNTER — Telehealth: Payer: Self-pay | Admitting: *Deleted

## 2016-03-05 NOTE — Telephone Encounter (Signed)
Pt given stress test results. Pt voiced understanding copy to PCP

## 2016-03-25 DIAGNOSIS — L089 Local infection of the skin and subcutaneous tissue, unspecified: Secondary | ICD-10-CM | POA: Diagnosis not present

## 2016-03-25 DIAGNOSIS — L299 Pruritus, unspecified: Secondary | ICD-10-CM | POA: Diagnosis not present

## 2016-04-01 ENCOUNTER — Encounter (INDEPENDENT_AMBULATORY_CARE_PROVIDER_SITE_OTHER): Payer: Medicare Other | Admitting: Orthopaedic Surgery

## 2016-04-01 ENCOUNTER — Encounter: Payer: Self-pay | Admitting: Orthopaedic Surgery

## 2016-04-07 DIAGNOSIS — D225 Melanocytic nevi of trunk: Secondary | ICD-10-CM | POA: Diagnosis not present

## 2016-04-07 DIAGNOSIS — L03012 Cellulitis of left finger: Secondary | ICD-10-CM | POA: Diagnosis not present

## 2016-04-07 DIAGNOSIS — L601 Onycholysis: Secondary | ICD-10-CM | POA: Diagnosis not present

## 2016-04-17 DIAGNOSIS — L03012 Cellulitis of left finger: Secondary | ICD-10-CM | POA: Diagnosis not present

## 2016-04-17 DIAGNOSIS — B351 Tinea unguium: Secondary | ICD-10-CM | POA: Diagnosis not present

## 2016-07-07 DIAGNOSIS — J449 Chronic obstructive pulmonary disease, unspecified: Secondary | ICD-10-CM | POA: Diagnosis not present

## 2016-07-07 DIAGNOSIS — Z23 Encounter for immunization: Secondary | ICD-10-CM | POA: Diagnosis not present

## 2016-07-07 DIAGNOSIS — I509 Heart failure, unspecified: Secondary | ICD-10-CM | POA: Diagnosis not present

## 2016-07-07 DIAGNOSIS — I10 Essential (primary) hypertension: Secondary | ICD-10-CM | POA: Diagnosis not present

## 2016-07-24 NOTE — Progress Notes (Signed)
This encounter was created in error - please disregard.

## 2016-08-07 ENCOUNTER — Other Ambulatory Visit (HOSPITAL_COMMUNITY): Payer: Self-pay | Admitting: Internal Medicine

## 2016-08-07 DIAGNOSIS — Z1231 Encounter for screening mammogram for malignant neoplasm of breast: Secondary | ICD-10-CM

## 2016-08-07 DIAGNOSIS — Z0000 Encounter for general adult medical examination without abnormal findings: Secondary | ICD-10-CM | POA: Diagnosis not present

## 2016-09-03 ENCOUNTER — Ambulatory Visit (HOSPITAL_COMMUNITY): Payer: Self-pay

## 2016-10-09 DIAGNOSIS — J449 Chronic obstructive pulmonary disease, unspecified: Secondary | ICD-10-CM | POA: Diagnosis not present

## 2016-10-09 DIAGNOSIS — I509 Heart failure, unspecified: Secondary | ICD-10-CM | POA: Diagnosis not present

## 2016-10-09 DIAGNOSIS — I10 Essential (primary) hypertension: Secondary | ICD-10-CM | POA: Diagnosis not present

## 2016-11-17 DIAGNOSIS — I10 Essential (primary) hypertension: Secondary | ICD-10-CM | POA: Diagnosis not present

## 2016-11-17 DIAGNOSIS — Z1159 Encounter for screening for other viral diseases: Secondary | ICD-10-CM | POA: Diagnosis not present

## 2016-11-29 DIAGNOSIS — I10 Essential (primary) hypertension: Secondary | ICD-10-CM | POA: Diagnosis not present

## 2016-11-29 DIAGNOSIS — Z0000 Encounter for general adult medical examination without abnormal findings: Secondary | ICD-10-CM | POA: Diagnosis not present

## 2016-11-29 DIAGNOSIS — J449 Chronic obstructive pulmonary disease, unspecified: Secondary | ICD-10-CM | POA: Diagnosis not present

## 2017-02-09 DIAGNOSIS — I10 Essential (primary) hypertension: Secondary | ICD-10-CM | POA: Diagnosis not present

## 2017-02-09 DIAGNOSIS — J449 Chronic obstructive pulmonary disease, unspecified: Secondary | ICD-10-CM | POA: Diagnosis not present

## 2017-02-09 DIAGNOSIS — I509 Heart failure, unspecified: Secondary | ICD-10-CM | POA: Diagnosis not present

## 2017-02-09 DIAGNOSIS — J9611 Chronic respiratory failure with hypoxia: Secondary | ICD-10-CM | POA: Diagnosis not present

## 2017-05-01 DIAGNOSIS — H353132 Nonexudative age-related macular degeneration, bilateral, intermediate dry stage: Secondary | ICD-10-CM | POA: Diagnosis not present

## 2017-05-01 DIAGNOSIS — H524 Presbyopia: Secondary | ICD-10-CM | POA: Diagnosis not present

## 2017-05-12 DIAGNOSIS — J449 Chronic obstructive pulmonary disease, unspecified: Secondary | ICD-10-CM | POA: Diagnosis not present

## 2017-05-12 DIAGNOSIS — I2510 Atherosclerotic heart disease of native coronary artery without angina pectoris: Secondary | ICD-10-CM | POA: Diagnosis not present

## 2017-05-12 DIAGNOSIS — I509 Heart failure, unspecified: Secondary | ICD-10-CM | POA: Diagnosis not present

## 2017-05-12 DIAGNOSIS — I10 Essential (primary) hypertension: Secondary | ICD-10-CM | POA: Diagnosis not present

## 2017-06-12 DIAGNOSIS — J449 Chronic obstructive pulmonary disease, unspecified: Secondary | ICD-10-CM | POA: Diagnosis not present

## 2017-07-13 DIAGNOSIS — J449 Chronic obstructive pulmonary disease, unspecified: Secondary | ICD-10-CM | POA: Diagnosis not present

## 2017-08-11 DIAGNOSIS — I2510 Atherosclerotic heart disease of native coronary artery without angina pectoris: Secondary | ICD-10-CM | POA: Diagnosis not present

## 2017-08-11 DIAGNOSIS — I10 Essential (primary) hypertension: Secondary | ICD-10-CM | POA: Diagnosis not present

## 2017-08-11 DIAGNOSIS — I509 Heart failure, unspecified: Secondary | ICD-10-CM | POA: Diagnosis not present

## 2017-08-11 DIAGNOSIS — J449 Chronic obstructive pulmonary disease, unspecified: Secondary | ICD-10-CM | POA: Diagnosis not present

## 2017-08-12 DIAGNOSIS — J449 Chronic obstructive pulmonary disease, unspecified: Secondary | ICD-10-CM | POA: Diagnosis not present

## 2017-09-12 DIAGNOSIS — J449 Chronic obstructive pulmonary disease, unspecified: Secondary | ICD-10-CM | POA: Diagnosis not present

## 2017-10-13 DIAGNOSIS — J449 Chronic obstructive pulmonary disease, unspecified: Secondary | ICD-10-CM | POA: Diagnosis not present

## 2017-11-10 DIAGNOSIS — J449 Chronic obstructive pulmonary disease, unspecified: Secondary | ICD-10-CM | POA: Diagnosis not present

## 2017-11-12 DIAGNOSIS — I10 Essential (primary) hypertension: Secondary | ICD-10-CM | POA: Diagnosis not present

## 2017-11-17 DIAGNOSIS — J441 Chronic obstructive pulmonary disease with (acute) exacerbation: Secondary | ICD-10-CM | POA: Diagnosis not present

## 2017-11-17 DIAGNOSIS — I2510 Atherosclerotic heart disease of native coronary artery without angina pectoris: Secondary | ICD-10-CM | POA: Diagnosis not present

## 2017-11-17 DIAGNOSIS — I10 Essential (primary) hypertension: Secondary | ICD-10-CM | POA: Diagnosis not present

## 2017-11-17 DIAGNOSIS — Z6821 Body mass index (BMI) 21.0-21.9, adult: Secondary | ICD-10-CM | POA: Diagnosis not present

## 2017-11-17 DIAGNOSIS — J9611 Chronic respiratory failure with hypoxia: Secondary | ICD-10-CM | POA: Diagnosis not present

## 2017-12-11 DIAGNOSIS — J449 Chronic obstructive pulmonary disease, unspecified: Secondary | ICD-10-CM | POA: Diagnosis not present

## 2018-01-10 DIAGNOSIS — J449 Chronic obstructive pulmonary disease, unspecified: Secondary | ICD-10-CM | POA: Diagnosis not present

## 2018-02-10 DIAGNOSIS — J449 Chronic obstructive pulmonary disease, unspecified: Secondary | ICD-10-CM | POA: Diagnosis not present

## 2018-02-22 ENCOUNTER — Other Ambulatory Visit (HOSPITAL_COMMUNITY): Payer: Self-pay | Admitting: Internal Medicine

## 2018-02-22 DIAGNOSIS — Z1231 Encounter for screening mammogram for malignant neoplasm of breast: Secondary | ICD-10-CM

## 2018-03-04 ENCOUNTER — Ambulatory Visit (HOSPITAL_COMMUNITY): Payer: Self-pay

## 2018-03-04 ENCOUNTER — Ambulatory Visit (HOSPITAL_COMMUNITY)
Admission: RE | Admit: 2018-03-04 | Discharge: 2018-03-04 | Disposition: A | Discharge status: Home / Self Care | Payer: Medicare Other | Source: Ambulatory Visit | Attending: Internal Medicine | Admitting: Internal Medicine

## 2018-03-04 ENCOUNTER — Other Ambulatory Visit (HOSPITAL_COMMUNITY): Payer: Self-pay | Admitting: Internal Medicine

## 2018-03-04 DIAGNOSIS — Z1231 Encounter for screening mammogram for malignant neoplasm of breast: Secondary | ICD-10-CM | POA: Insufficient documentation

## 2018-03-12 DIAGNOSIS — J449 Chronic obstructive pulmonary disease, unspecified: Secondary | ICD-10-CM | POA: Diagnosis not present

## 2018-04-12 DIAGNOSIS — J449 Chronic obstructive pulmonary disease, unspecified: Secondary | ICD-10-CM | POA: Diagnosis not present

## 2018-05-13 DIAGNOSIS — J449 Chronic obstructive pulmonary disease, unspecified: Secondary | ICD-10-CM | POA: Diagnosis not present

## 2018-05-17 DIAGNOSIS — Z0000 Encounter for general adult medical examination without abnormal findings: Secondary | ICD-10-CM | POA: Diagnosis not present

## 2018-05-17 DIAGNOSIS — I10 Essential (primary) hypertension: Secondary | ICD-10-CM | POA: Diagnosis not present

## 2018-05-19 DIAGNOSIS — Z6821 Body mass index (BMI) 21.0-21.9, adult: Secondary | ICD-10-CM | POA: Diagnosis not present

## 2018-05-19 DIAGNOSIS — Z0001 Encounter for general adult medical examination with abnormal findings: Secondary | ICD-10-CM | POA: Diagnosis not present

## 2018-05-19 DIAGNOSIS — J449 Chronic obstructive pulmonary disease, unspecified: Secondary | ICD-10-CM | POA: Diagnosis not present

## 2018-05-19 DIAGNOSIS — I10 Essential (primary) hypertension: Secondary | ICD-10-CM | POA: Diagnosis not present

## 2018-05-19 DIAGNOSIS — J9601 Acute respiratory failure with hypoxia: Secondary | ICD-10-CM | POA: Diagnosis not present

## 2018-05-27 DIAGNOSIS — Z23 Encounter for immunization: Secondary | ICD-10-CM | POA: Diagnosis not present

## 2018-06-03 DIAGNOSIS — D0439 Carcinoma in situ of skin of other parts of face: Secondary | ICD-10-CM | POA: Diagnosis not present

## 2018-06-08 DIAGNOSIS — J449 Chronic obstructive pulmonary disease, unspecified: Secondary | ICD-10-CM | POA: Diagnosis not present

## 2018-06-08 DIAGNOSIS — I509 Heart failure, unspecified: Secondary | ICD-10-CM | POA: Diagnosis not present

## 2018-06-08 DIAGNOSIS — I10 Essential (primary) hypertension: Secondary | ICD-10-CM | POA: Diagnosis not present

## 2018-06-08 DIAGNOSIS — I2510 Atherosclerotic heart disease of native coronary artery without angina pectoris: Secondary | ICD-10-CM | POA: Diagnosis not present

## 2018-06-12 DIAGNOSIS — J449 Chronic obstructive pulmonary disease, unspecified: Secondary | ICD-10-CM | POA: Diagnosis not present

## 2018-07-01 DIAGNOSIS — J441 Chronic obstructive pulmonary disease with (acute) exacerbation: Secondary | ICD-10-CM | POA: Diagnosis not present

## 2018-07-01 DIAGNOSIS — I2510 Atherosclerotic heart disease of native coronary artery without angina pectoris: Secondary | ICD-10-CM | POA: Diagnosis not present

## 2018-07-01 DIAGNOSIS — I10 Essential (primary) hypertension: Secondary | ICD-10-CM | POA: Diagnosis not present

## 2018-07-01 DIAGNOSIS — J449 Chronic obstructive pulmonary disease, unspecified: Secondary | ICD-10-CM | POA: Diagnosis not present

## 2018-07-13 DIAGNOSIS — J449 Chronic obstructive pulmonary disease, unspecified: Secondary | ICD-10-CM | POA: Diagnosis not present

## 2018-07-19 DIAGNOSIS — Z85828 Personal history of other malignant neoplasm of skin: Secondary | ICD-10-CM | POA: Diagnosis not present

## 2018-07-19 DIAGNOSIS — Z08 Encounter for follow-up examination after completed treatment for malignant neoplasm: Secondary | ICD-10-CM | POA: Diagnosis not present

## 2018-08-12 DIAGNOSIS — J449 Chronic obstructive pulmonary disease, unspecified: Secondary | ICD-10-CM | POA: Diagnosis not present

## 2018-09-12 DIAGNOSIS — J449 Chronic obstructive pulmonary disease, unspecified: Secondary | ICD-10-CM | POA: Diagnosis not present

## 2018-10-04 DIAGNOSIS — J449 Chronic obstructive pulmonary disease, unspecified: Secondary | ICD-10-CM | POA: Diagnosis not present

## 2018-10-04 DIAGNOSIS — K2900 Acute gastritis without bleeding: Secondary | ICD-10-CM | POA: Diagnosis not present

## 2018-10-13 DIAGNOSIS — J449 Chronic obstructive pulmonary disease, unspecified: Secondary | ICD-10-CM | POA: Diagnosis not present

## 2018-10-13 DIAGNOSIS — L03012 Cellulitis of left finger: Secondary | ICD-10-CM | POA: Diagnosis not present

## 2018-11-11 DIAGNOSIS — J449 Chronic obstructive pulmonary disease, unspecified: Secondary | ICD-10-CM | POA: Diagnosis not present

## 2018-12-09 DIAGNOSIS — I2510 Atherosclerotic heart disease of native coronary artery without angina pectoris: Secondary | ICD-10-CM | POA: Diagnosis not present

## 2018-12-09 DIAGNOSIS — I10 Essential (primary) hypertension: Secondary | ICD-10-CM | POA: Diagnosis not present

## 2018-12-09 DIAGNOSIS — J441 Chronic obstructive pulmonary disease with (acute) exacerbation: Secondary | ICD-10-CM | POA: Diagnosis not present

## 2018-12-09 DIAGNOSIS — J449 Chronic obstructive pulmonary disease, unspecified: Secondary | ICD-10-CM | POA: Diagnosis not present

## 2018-12-12 DIAGNOSIS — J449 Chronic obstructive pulmonary disease, unspecified: Secondary | ICD-10-CM | POA: Diagnosis not present

## 2018-12-27 DIAGNOSIS — C44629 Squamous cell carcinoma of skin of left upper limb, including shoulder: Secondary | ICD-10-CM | POA: Diagnosis not present

## 2019-01-03 DIAGNOSIS — I10 Essential (primary) hypertension: Secondary | ICD-10-CM | POA: Diagnosis not present

## 2019-01-03 DIAGNOSIS — J441 Chronic obstructive pulmonary disease with (acute) exacerbation: Secondary | ICD-10-CM | POA: Diagnosis not present

## 2019-01-03 DIAGNOSIS — I2510 Atherosclerotic heart disease of native coronary artery without angina pectoris: Secondary | ICD-10-CM | POA: Diagnosis not present

## 2019-01-07 DIAGNOSIS — Z0000 Encounter for general adult medical examination without abnormal findings: Secondary | ICD-10-CM | POA: Diagnosis not present

## 2019-01-11 DIAGNOSIS — J449 Chronic obstructive pulmonary disease, unspecified: Secondary | ICD-10-CM | POA: Diagnosis not present

## 2019-01-11 DIAGNOSIS — C44629 Squamous cell carcinoma of skin of left upper limb, including shoulder: Secondary | ICD-10-CM | POA: Diagnosis not present

## 2019-01-13 ENCOUNTER — Telehealth: Payer: Self-pay | Admitting: Plastic Surgery

## 2019-01-13 NOTE — Telephone Encounter (Signed)
Patient called to confirm appointment scheduled for tomorrow. Patient answered the following questions: 1. To the best of your knowledge, have you been in close contact with any one with a confirmed diagnosis of COVID-19? No 2. Have you had any one or more of the following; fever, chills, cough, shortness of breath, or any flu-like symptoms? No, except for shortness of breath due to COPD 3. Have you been diagnosed with or have a previous diagnosis of COVID 19? No 4. I am going to go over a few other symptoms with you. Please let me know if you are experiencing any of the following: None of the below a. Ear, nose, or throat discomfort b. A sore throat c. Headache d. Muscle pain e. Diarrhea f. Loss of taste or smell

## 2019-01-14 ENCOUNTER — Other Ambulatory Visit: Payer: Self-pay

## 2019-01-14 ENCOUNTER — Emergency Department (HOSPITAL_COMMUNITY): Payer: Medicare Other

## 2019-01-14 ENCOUNTER — Institutional Professional Consult (permissible substitution): Payer: Medicare Other | Admitting: Plastic Surgery

## 2019-01-14 ENCOUNTER — Encounter (HOSPITAL_COMMUNITY): Payer: Self-pay | Admitting: Emergency Medicine

## 2019-01-14 ENCOUNTER — Inpatient Hospital Stay (HOSPITAL_COMMUNITY)
Admission: EM | Admit: 2019-01-14 | Discharge: 2019-01-20 | DRG: 206 | Disposition: A | Discharge status: Home Health | Payer: Medicare Other | Attending: General Surgery | Admitting: General Surgery

## 2019-01-14 ENCOUNTER — Inpatient Hospital Stay (HOSPITAL_COMMUNITY): Payer: Medicare Other

## 2019-01-14 DIAGNOSIS — Z9981 Dependence on supplemental oxygen: Secondary | ICD-10-CM

## 2019-01-14 DIAGNOSIS — J441 Chronic obstructive pulmonary disease with (acute) exacerbation: Secondary | ICD-10-CM | POA: Diagnosis not present

## 2019-01-14 DIAGNOSIS — Z7951 Long term (current) use of inhaled steroids: Secondary | ICD-10-CM | POA: Diagnosis not present

## 2019-01-14 DIAGNOSIS — S0990XA Unspecified injury of head, initial encounter: Secondary | ICD-10-CM | POA: Diagnosis not present

## 2019-01-14 DIAGNOSIS — S301XXA Contusion of abdominal wall, initial encounter: Secondary | ICD-10-CM | POA: Diagnosis not present

## 2019-01-14 DIAGNOSIS — R0603 Acute respiratory distress: Secondary | ICD-10-CM

## 2019-01-14 DIAGNOSIS — R911 Solitary pulmonary nodule: Secondary | ICD-10-CM | POA: Diagnosis present

## 2019-01-14 DIAGNOSIS — R079 Chest pain, unspecified: Secondary | ICD-10-CM | POA: Diagnosis not present

## 2019-01-14 DIAGNOSIS — R0689 Other abnormalities of breathing: Secondary | ICD-10-CM | POA: Diagnosis not present

## 2019-01-14 DIAGNOSIS — Z79899 Other long term (current) drug therapy: Secondary | ICD-10-CM | POA: Diagnosis not present

## 2019-01-14 DIAGNOSIS — Z1159 Encounter for screening for other viral diseases: Secondary | ICD-10-CM | POA: Diagnosis not present

## 2019-01-14 DIAGNOSIS — N281 Cyst of kidney, acquired: Secondary | ICD-10-CM | POA: Diagnosis present

## 2019-01-14 DIAGNOSIS — J449 Chronic obstructive pulmonary disease, unspecified: Secondary | ICD-10-CM | POA: Diagnosis present

## 2019-01-14 DIAGNOSIS — S3993XA Unspecified injury of pelvis, initial encounter: Secondary | ICD-10-CM | POA: Diagnosis not present

## 2019-01-14 DIAGNOSIS — Z85828 Personal history of other malignant neoplasm of skin: Secondary | ICD-10-CM

## 2019-01-14 DIAGNOSIS — I2510 Atherosclerotic heart disease of native coronary artery without angina pectoris: Secondary | ICD-10-CM | POA: Diagnosis not present

## 2019-01-14 DIAGNOSIS — I509 Heart failure, unspecified: Secondary | ICD-10-CM | POA: Diagnosis present

## 2019-01-14 DIAGNOSIS — S2231XA Fracture of one rib, right side, initial encounter for closed fracture: Secondary | ICD-10-CM | POA: Diagnosis not present

## 2019-01-14 DIAGNOSIS — S6992XA Unspecified injury of left wrist, hand and finger(s), initial encounter: Secondary | ICD-10-CM | POA: Diagnosis not present

## 2019-01-14 DIAGNOSIS — I110 Hypertensive heart disease with heart failure: Secondary | ICD-10-CM | POA: Diagnosis present

## 2019-01-14 DIAGNOSIS — Y92410 Unspecified street and highway as the place of occurrence of the external cause: Secondary | ICD-10-CM

## 2019-01-14 DIAGNOSIS — C7642 Malignant neoplasm of left upper limb: Secondary | ICD-10-CM | POA: Diagnosis not present

## 2019-01-14 DIAGNOSIS — Z9049 Acquired absence of other specified parts of digestive tract: Secondary | ICD-10-CM

## 2019-01-14 DIAGNOSIS — S2220XA Unspecified fracture of sternum, initial encounter for closed fracture: Secondary | ICD-10-CM | POA: Diagnosis not present

## 2019-01-14 DIAGNOSIS — R58 Hemorrhage, not elsewhere classified: Secondary | ICD-10-CM | POA: Diagnosis not present

## 2019-01-14 DIAGNOSIS — S299XXA Unspecified injury of thorax, initial encounter: Secondary | ICD-10-CM | POA: Diagnosis not present

## 2019-01-14 DIAGNOSIS — Z8701 Personal history of pneumonia (recurrent): Secondary | ICD-10-CM | POA: Diagnosis not present

## 2019-01-14 DIAGNOSIS — S2241XA Multiple fractures of ribs, right side, initial encounter for closed fracture: Secondary | ICD-10-CM | POA: Diagnosis not present

## 2019-01-14 DIAGNOSIS — R069 Unspecified abnormalities of breathing: Secondary | ICD-10-CM | POA: Diagnosis not present

## 2019-01-14 DIAGNOSIS — R410 Disorientation, unspecified: Secondary | ICD-10-CM | POA: Diagnosis not present

## 2019-01-14 DIAGNOSIS — S2220XB Unspecified fracture of sternum, initial encounter for open fracture: Secondary | ICD-10-CM | POA: Diagnosis not present

## 2019-01-14 DIAGNOSIS — R0602 Shortness of breath: Secondary | ICD-10-CM | POA: Diagnosis not present

## 2019-01-14 LAB — CBC WITH DIFFERENTIAL/PLATELET
Abs Immature Granulocytes: 0.12 10*3/uL — ABNORMAL HIGH (ref 0.00–0.07)
Basophils Absolute: 0 10*3/uL (ref 0.0–0.1)
Basophils Relative: 0 %
Eosinophils Absolute: 0.1 10*3/uL (ref 0.0–0.5)
Eosinophils Relative: 1 %
HCT: 43.2 % (ref 36.0–46.0)
Hemoglobin: 14.5 g/dL (ref 12.0–15.0)
Immature Granulocytes: 1 %
Lymphocytes Relative: 6 %
Lymphs Abs: 1 10*3/uL (ref 0.7–4.0)
MCH: 31.7 pg (ref 26.0–34.0)
MCHC: 33.6 g/dL (ref 30.0–36.0)
MCV: 94.3 fL (ref 80.0–100.0)
Monocytes Absolute: 0.5 10*3/uL (ref 0.1–1.0)
Monocytes Relative: 3 %
Neutro Abs: 17 10*3/uL — ABNORMAL HIGH (ref 1.7–7.7)
Neutrophils Relative %: 89 %
Platelets: 223 10*3/uL (ref 150–400)
RBC: 4.58 MIL/uL (ref 3.87–5.11)
RDW: 13.3 % (ref 11.5–15.5)
WBC: 18.7 10*3/uL — ABNORMAL HIGH (ref 4.0–10.5)
nRBC: 0 % (ref 0.0–0.2)

## 2019-01-14 LAB — BASIC METABOLIC PANEL
Anion gap: 12 (ref 5–15)
BUN: 8 mg/dL (ref 8–23)
CO2: 23 mmol/L (ref 22–32)
Calcium: 9.6 mg/dL (ref 8.9–10.3)
Chloride: 105 mmol/L (ref 98–111)
Creatinine, Ser: 0.53 mg/dL (ref 0.44–1.00)
GFR calc Af Amer: >60 mL/min (ref >60)
GFR calc non Af Amer: >60 mL/min (ref >60)
Glucose, Bld: 132 mg/dL — ABNORMAL HIGH (ref 70–99)
Potassium: 3.7 mmol/L (ref 3.5–5.1)
Sodium: 140 mmol/L (ref 135–145)

## 2019-01-14 LAB — MRSA PCR SCREENING: MRSA by PCR: NEGATIVE

## 2019-01-14 LAB — SARS CORONAVIRUS 2 BY RT PCR (HOSPITAL ORDER, PERFORMED IN ~~LOC~~ HOSPITAL LAB): SARS Coronavirus 2: NEGATIVE

## 2019-01-14 MED ORDER — PANTOPRAZOLE SODIUM 40 MG PO TBEC
40.0000 mg | DELAYED_RELEASE_TABLET | Freq: Every day | ORAL | Status: DC
  Administered 2019-01-15 – 2019-01-20 (×6): 40 mg via ORAL
  Filled 2019-01-14 (×6): qty 1

## 2019-01-14 MED ORDER — MORPHINE SULFATE (PF) 2 MG/ML IV SOLN
2.0000 mg | INTRAVENOUS | Status: DC | PRN
  Administered 2019-01-14: 4 mg via INTRAVENOUS
  Administered 2019-01-18 (×2): 2 mg via INTRAVENOUS
  Filled 2019-01-14 (×2): qty 1
  Filled 2019-01-14: qty 2

## 2019-01-14 MED ORDER — IPRATROPIUM-ALBUTEROL 0.5-2.5 (3) MG/3ML IN SOLN
3.0000 mL | Freq: Four times a day (QID) | RESPIRATORY_TRACT | Status: DC | PRN
  Administered 2019-01-15 – 2019-01-16 (×3): 3 mL via RESPIRATORY_TRACT
  Filled 2019-01-14 (×3): qty 3

## 2019-01-14 MED ORDER — HYDRALAZINE HCL 20 MG/ML IJ SOLN: 10.0000 mg | INTRAMUSCULAR | Status: DC | PRN

## 2019-01-14 MED ORDER — MORPHINE SULFATE (PF) 2 MG/ML IV SOLN: 2.0000 mg | INTRAVENOUS | Status: DC | PRN

## 2019-01-14 MED ORDER — ALBUTEROL SULFATE HFA 108 (90 BASE) MCG/ACT IN AERS
2.0000 | INHALATION_SPRAY | Freq: Four times a day (QID) | RESPIRATORY_TRACT | Status: DC
  Administered 2019-01-14: 10:00:00 2 via RESPIRATORY_TRACT
  Filled 2019-01-14: qty 6.7

## 2019-01-14 MED ORDER — HYDROCODONE-ACETAMINOPHEN 5-325 MG PO TABS
1.0000 | ORAL_TABLET | ORAL | Status: DC | PRN
  Administered 2019-01-15 – 2019-01-18 (×11): 1 via ORAL
  Filled 2019-01-14 (×12): qty 1

## 2019-01-14 MED ORDER — PANTOPRAZOLE SODIUM 40 MG IV SOLR: 40.0000 mg | Freq: Every day | INTRAVENOUS | Status: DC

## 2019-01-14 MED ORDER — ONDANSETRON HCL 4 MG/2ML IJ SOLN
4.0000 mg | Freq: Four times a day (QID) | INTRAMUSCULAR | Status: DC | PRN
  Administered 2019-01-16 – 2019-01-18 (×4): 4 mg via INTRAVENOUS
  Filled 2019-01-14 (×4): qty 2

## 2019-01-14 MED ORDER — FENTANYL CITRATE (PF) 100 MCG/2ML IJ SOLN
12.5000 ug | Freq: Once | INTRAMUSCULAR | Status: AC
  Administered 2019-01-14: 12.5 ug via INTRAVENOUS
  Filled 2019-01-14: qty 2

## 2019-01-14 MED ORDER — IOHEXOL 300 MG/ML  SOLN
100.0000 mL | Freq: Once | INTRAMUSCULAR | Status: AC | PRN
  Administered 2019-01-14: 16:00:00 100 mL via INTRAVENOUS

## 2019-01-14 MED ORDER — ONDANSETRON 4 MG PO TBDP: 4.0000 mg | ORAL_TABLET | Freq: Four times a day (QID) | ORAL | Status: DC | PRN

## 2019-01-14 MED ORDER — DOCUSATE SODIUM 100 MG PO CAPS
100.0000 mg | ORAL_CAPSULE | Freq: Two times a day (BID) | ORAL | Status: DC
  Administered 2019-01-14 – 2019-01-19 (×9): 100 mg via ORAL
  Filled 2019-01-14 (×11): qty 1

## 2019-01-14 MED ORDER — AMLODIPINE BESYLATE 5 MG PO TABS
5.0000 mg | ORAL_TABLET | Freq: Every day | ORAL | Status: DC
  Administered 2019-01-14 – 2019-01-19 (×6): 5 mg via ORAL
  Filled 2019-01-14 (×6): qty 1

## 2019-01-14 MED ORDER — MOMETASONE FURO-FORMOTEROL FUM 200-5 MCG/ACT IN AERO
2.0000 | INHALATION_SPRAY | Freq: Two times a day (BID) | RESPIRATORY_TRACT | Status: DC
  Administered 2019-01-14 – 2019-01-20 (×12): 2 via RESPIRATORY_TRACT
  Filled 2019-01-14: qty 8.8

## 2019-01-14 MED ORDER — ACETAMINOPHEN 500 MG PO TABS
500.0000 mg | ORAL_TABLET | Freq: Four times a day (QID) | ORAL | Status: DC
  Administered 2019-01-14 – 2019-01-20 (×20): 500 mg via ORAL
  Filled 2019-01-14 (×21): qty 1

## 2019-01-14 MED ORDER — ENOXAPARIN SODIUM 40 MG/0.4ML ~~LOC~~ SOLN
40.0000 mg | SUBCUTANEOUS | Status: DC
  Administered 2019-01-15 – 2019-01-20 (×6): 40 mg via SUBCUTANEOUS
  Filled 2019-01-14 (×6): qty 0.4

## 2019-01-14 MED ORDER — MORPHINE SULFATE (PF) 2 MG/ML IV SOLN
2.0000 mg | Freq: Once | INTRAVENOUS | Status: AC
  Administered 2019-01-14: 13:00:00 2 mg via INTRAMUSCULAR
  Filled 2019-01-14: qty 1

## 2019-01-14 MED ORDER — DEXAMETHASONE SODIUM PHOSPHATE 10 MG/ML IJ SOLN
10.0000 mg | Freq: Once | INTRAMUSCULAR | Status: AC
  Administered 2019-01-14: 10 mg via INTRAVENOUS
  Filled 2019-01-14: qty 1

## 2019-01-14 MED ORDER — HYDROMORPHONE HCL 1 MG/ML IJ SOLN
1.0000 mg | INTRAMUSCULAR | Status: DC | PRN
  Administered 2019-01-14 – 2019-01-16 (×3): 1 mg via INTRAVENOUS
  Filled 2019-01-14 (×3): qty 1

## 2019-01-14 MED ORDER — ALBUTEROL SULFATE HFA 108 (90 BASE) MCG/ACT IN AERS
2.0000 | INHALATION_SPRAY | Freq: Three times a day (TID) | RESPIRATORY_TRACT | Status: DC
  Administered 2019-01-15 – 2019-01-20 (×14): 2 via RESPIRATORY_TRACT
  Filled 2019-01-14: qty 6.7
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
## 2019-01-14 NOTE — ED Notes (Signed)
Attempted report 

## 2019-01-14 NOTE — Plan of Care (Signed)
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
## 2019-01-14 NOTE — ED Notes (Signed)
ED Provider at bedside. 
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
## 2019-01-15 LAB — CBC
HCT: 39.7 % (ref 36.0–46.0)
Hemoglobin: 13.5 g/dL (ref 12.0–15.0)
MCH: 31.4 pg (ref 26.0–34.0)
MCHC: 34 g/dL (ref 30.0–36.0)
MCV: 92.3 fL (ref 80.0–100.0)
Platelets: 214 10*3/uL (ref 150–400)
RBC: 4.3 MIL/uL (ref 3.87–5.11)
RDW: 13.2 % (ref 11.5–15.5)
WBC: 10.6 10*3/uL — ABNORMAL HIGH (ref 4.0–10.5)
nRBC: 0 % (ref 0.0–0.2)

## 2019-01-15 LAB — BASIC METABOLIC PANEL
Anion gap: 12 (ref 5–15)
BUN: 8 mg/dL (ref 8–23)
CO2: 22 mmol/L (ref 22–32)
Calcium: 9.3 mg/dL (ref 8.9–10.3)
Chloride: 104 mmol/L (ref 98–111)
Creatinine, Ser: 0.75 mg/dL (ref 0.44–1.00)
GFR calc Af Amer: >60 mL/min (ref >60)
GFR calc non Af Amer: >60 mL/min (ref >60)
Glucose, Bld: 179 mg/dL — ABNORMAL HIGH (ref 70–99)
Potassium: 4 mmol/L (ref 3.5–5.1)
Sodium: 138 mmol/L (ref 135–145)
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
## 2019-01-17 MED ORDER — METOPROLOL SUCCINATE ER 100 MG PO TB24
100.0000 mg | ORAL_TABLET | Freq: Every day | ORAL | Status: DC
  Administered 2019-01-17 – 2019-01-20 (×4): 100 mg via ORAL
  Filled 2019-01-17 (×4): qty 1

## 2019-01-17 MED ORDER — TRAMADOL HCL 50 MG PO TABS
50.0000 mg | ORAL_TABLET | Freq: Four times a day (QID) | ORAL | Status: DC
  Administered 2019-01-17 (×3): 50 mg via ORAL
  Filled 2019-01-17 (×4): qty 1

## 2019-01-17 MED ORDER — METOPROLOL TARTRATE 25 MG PO TABS: 25.0000 mg | ORAL_TABLET | Freq: Two times a day (BID) | ORAL | Status: DC
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
## 2019-01-17 NOTE — Care Management Important Message (Signed)
Important Message  Patient Details  Name: Debbie [name] MRN: [id number] Date of Birth: August 02, 1946   Medicare Important Message Given:  Yes    Orbie Pyo 01/17/2019, 3:26 PM
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
## 2019-01-18 ENCOUNTER — Inpatient Hospital Stay (HOSPITAL_COMMUNITY): Payer: Medicare Other

## 2019-01-18 LAB — TROPONIN I: Troponin I: <0.03 ng/mL

## 2019-01-18 LAB — BASIC METABOLIC PANEL WITH GFR
Anion gap: 12 (ref 5–15)
BUN: 10 mg/dL (ref 8–23)
CO2: 29 mmol/L (ref 22–32)
Calcium: 9 mg/dL (ref 8.9–10.3)
Chloride: 91 mmol/L — ABNORMAL LOW (ref 98–111)
Creatinine, Ser: 0.62 mg/dL (ref 0.44–1.00)
GFR calc Af Amer: >60 mL/min
GFR calc non Af Amer: >60 mL/min
Glucose, Bld: 132 mg/dL — ABNORMAL HIGH (ref 70–99)
Potassium: 4.1 mmol/L (ref 3.5–5.1)
Sodium: 132 mmol/L — ABNORMAL LOW (ref 135–145)

## 2019-01-18 MED ORDER — METHOCARBAMOL 1000 MG/10ML IJ SOLN
500.0000 mg | Freq: Once | INTRAVENOUS | Status: AC
  Administered 2019-01-18: 500 mg via INTRAVENOUS
  Filled 2019-01-18: qty 500

## 2019-01-18 MED ORDER — DOCUSATE SODIUM 100 MG PO CAPS: 100.0000 mg | ORAL_CAPSULE | Freq: Two times a day (BID) | ORAL | 0 refills | Status: DC

## 2019-01-18 MED ORDER — MORPHINE SULFATE (PF) 2 MG/ML IV SOLN: 2.0000 mg | Freq: Once | INTRAVENOUS | Status: DC

## 2019-01-18 MED ORDER — TRAMADOL HCL 50 MG PO TABS: 50.0000 mg | ORAL_TABLET | Freq: Four times a day (QID) | ORAL | 0 refills | Status: DC

## 2019-01-18 MED ORDER — ACETAMINOPHEN 325 MG PO TABS: 325.0000 mg | ORAL_TABLET | Freq: Four times a day (QID) | ORAL | Status: DC | PRN

## 2019-01-18 MED ORDER — HYDROCODONE-ACETAMINOPHEN 5-325 MG PO TABS: 1.0000 | ORAL_TABLET | ORAL | 0 refills | Status: DC | PRN

## 2019-01-18 MED ORDER — IOHEXOL 350 MG/ML SOLN
100.0000 mL | Freq: Once | INTRAVENOUS | Status: AC | PRN
  Administered 2019-01-18: 100 mL via INTRAVENOUS
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
## 2019-01-18 NOTE — Progress Notes (Signed)
SATURATION QUALIFICATIONS: (This note is used to comply with regulatory documentation for home oxygen)  Patient Saturations on Room Air at Rest = Did not perform as pt's Sp02 is 83% on 3L/min 02 at rest  Patient Saturations on Room Air while Ambulating = Did not perform as pt's Sp02 is 83% on 3L/min 02 at rest  Patient Saturations on 4 Liters of oxygen while Ambulating = 86%   Please briefly explain why patient needs home oxygen: Pt is not able to maintain oxygen saturation >90% even on 3L/min 02 [place].    Wray Kearns, PT, DPT Acute Rehabilitation Services Pager [phone] Office [phone]
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
## 2019-01-18 NOTE — Progress Notes (Addendum)
Patient found sitting in chair with face down on tray.  Patient was unresponsive for approx 30 seconds.  Rapid response was called as well as trauma team.  Patient unable to answer questions, only moans attempted responses.
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
## 2019-01-18 NOTE — Progress Notes (Signed)
Patient ID: Debbie [name], female   DOB: 13-Mar-1946, 73 y.o.   MRN: [id number] Called to see by RN. Patient was found to be slumped in her chair. She was moved to the bed and rapid response saw her.On my arrival, sats 94%, HR 100, SBP 155. She is moaning but answers some questions and MAE. No lateralizing sign. EKG sinus tach with PVC. Will check CT head, CTA chest, and troponins. Try Robaxin X 1 IV. Cancel D/C. I called her daughter, Debbie [name], and updated her.  Georganna Skeans, MD, MPH, FACS Trauma & General Surgery: [phone]

## 2019-01-19 ENCOUNTER — Telehealth (HOSPITAL_COMMUNITY): Payer: Self-pay

## 2019-01-19 LAB — URINALYSIS, ROUTINE W REFLEX MICROSCOPIC
Bilirubin Urine: NEGATIVE
Glucose, UA: NEGATIVE mg/dL
Hgb urine dipstick: NEGATIVE
Ketones, ur: 20 mg/dL — AB
Leukocytes,Ua: NEGATIVE
Nitrite: NEGATIVE
Protein, ur: NEGATIVE mg/dL
Specific Gravity, Urine: 1.019 (ref 1.005–1.030)
pH: 5 (ref 5.0–8.0)

## 2019-01-19 MED ORDER — MORPHINE SULFATE (PF) 2 MG/ML IV SOLN: 2.0000 mg | INTRAVENOUS | Status: DC | PRN

## 2019-01-19 MED ORDER — GUAIFENESIN 100 MG/5ML PO SOLN
5.0000 mL | Freq: Three times a day (TID) | ORAL | Status: DC
  Administered 2019-01-19 – 2019-01-20 (×5): 100 mg via ORAL
  Filled 2019-01-19 (×5): qty 10

## 2019-01-19 MED ORDER — METHOCARBAMOL 500 MG PO TABS
500.0000 mg | ORAL_TABLET | Freq: Three times a day (TID) | ORAL | Status: DC
  Administered 2019-01-19 – 2019-01-20 (×5): 500 mg via ORAL
  Filled 2019-01-19 (×5): qty 1

## 2019-01-19 MED ORDER — HYDROCODONE-ACETAMINOPHEN 5-325 MG PO TABS
1.0000 | ORAL_TABLET | ORAL | Status: DC | PRN
  Administered 2019-01-19 (×2): 1 via ORAL
  Filled 2019-01-19: qty 1
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
## 2019-01-20 LAB — URINE CULTURE

## 2019-01-20 MED ORDER — METHOCARBAMOL 500 MG PO TABS: 500.0000 mg | ORAL_TABLET | Freq: Three times a day (TID) | ORAL | 1 refills | Status: DC

## 2019-01-20 MED ORDER — ACETAMINOPHEN 325 MG PO TABS
650.0000 mg | ORAL_TABLET | Freq: Four times a day (QID) | ORAL | Status: DC
  Administered 2019-01-20: 650 mg via ORAL
  Filled 2019-01-20: qty 2

## 2019-01-20 MED ORDER — OXYCODONE HCL 5 MG PO TABS
5.0000 mg | ORAL_TABLET | ORAL | Status: DC | PRN
  Administered 2019-01-20: 5 mg via ORAL
  Filled 2019-01-20: qty 1

## 2019-01-20 MED ORDER — ACETAMINOPHEN 325 MG PO TABS: 325.0000 mg | ORAL_TABLET | Freq: Four times a day (QID) | ORAL | Status: DC | PRN

## 2019-01-20 NOTE — Progress Notes (Signed)
Pt with d/c orders. Discharge paperwork reviewed with pt and all questions answered. IVs removed. Pt escorted to vehicle via wheelchair with equipment and belongings.
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
## 2019-01-20 NOTE — Care Management Important Message (Signed)
Important Message  Patient Details  Name: Debbie [name] MRN: [id number] Date of Birth: [birth year]   Medicare Important Message Given:  Yes    Orbie Pyo 01/20/2019, 3:32 PM

## 2019-01-20 NOTE — Progress Notes (Signed)
Pt with belongings in security. RN retrieved belongings. Took to bedside. Open enveloped in front of pt and returned pts belongings of cash, wallet, checkbook, and credit card. Envelope put into pts pocketbook.

## 2019-01-20 NOTE — Discharge Instructions (Signed)
RIB FRACTURES  HOME INSTRUCTIONS   1. PAIN CONTROL:  1. Pain is best controlled by a usual combination of three different methods TOGETHER:  i. Ice/Heat ii. Over the counter pain medication iii. Prescription pain medication 2. You may experience some swelling and bruising in area of broken ribs. Ice packs or heating pads (30-60 minutes up to 6 times a day) will help. Use ice for the first few days to help decrease swelling and bruising, then switch to heat to help relax tight/sore spots and speed recovery. Some people prefer to use ice alone, heat alone, alternating between ice & heat. Experiment to what works for you. Swelling and bruising can take several weeks to resolve.  3. It is helpful to take an over-the-counter pain medication regularly for the first few weeks. Choose one of the following that works best for you:  i. Naproxen (Aleve, etc) Two 220mg tabs twice a day ii. Ibuprofen (Advil, etc) Three 200mg tabs four times a day (every meal & bedtime) iii. Acetaminophen (Tylenol, etc) 500-650mg four times a day (every meal & bedtime) 4. A prescription for pain medication (such as oxycodone, hydrocodone, etc) may be given to you upon discharge. Take your pain medication as prescribed.  i. If you are having problems/concerns with the prescription medicine (does not control pain, nausea, vomiting, rash, itching, etc), please call us [phone] to see if we need to switch you to a different pain medicine that will work better for you and/or control your side effect better. ii. If you need a refill on your pain medication, please contact your pharmacy. They will contact our office to request authorization. Prescriptions will not be filled after 5 pm or on week-ends. 1. Avoid getting constipated. When taking pain medications, it is common to experience some constipation. Increasing fluid intake and taking a fiber supplement (such as Metamucil, Citrucel, FiberCon, MiraLax, etc) 1-2 times a day  regularly will usually help prevent this problem from occurring. A mild laxative (prune juice, Milk of Magnesia, MiraLax, etc) should be taken according to package directions if there are no bowel movements after 48 hours.  2. Watch out for diarrhea. If you have many loose bowel movements, simplify your diet to bland foods & liquids for a few days. Stop any stool softeners and decrease your fiber supplement. Switching to mild anti-diarrheal medications (Kayopectate, Pepto Bismol) can help. If this worsens or does not improve, please call us. 3. FOLLOW UP  a. If a follow up appointment is needed one will be scheduled for you. If none is needed with our trauma team, please follow up with your primary care provider within 2-3 weeks from discharge. Please call [facility] at [phone] if you have any questions about follow up.  b. If you have any orthopedic or other injuries you will need to follow up as outlined in your follow up instructions.   WHEN TO CALL US [phone]:  1. Poor pain control 2. Reactions / problems with new medications (rash/itching, nausea, etc)  3. Fever over 101.5 F (38.5 C) 4. Worsening swelling or bruising 5. Worsening pain, productive cough, difficulty breathing or any other concerning symptoms  The clinic staff is available to answer your questions during regular business hours (8:30am-5pm). Please don't hesitate to call and ask to speak to one of our nurses for clinical concerns.  If you have a medical emergency, go to the nearest emergency room or call 911.  A surgeon from [facility] is always on call   at the hospitals   [facility]  [address], [address], [place], [place] [address] ?  MAIN: [phone] ? TOLL FREE: [phone] ?  FAX [phone]  [email or web address]      Information on Rib Fractures  A rib fracture is a break or crack in one of the bones of the ribs. The ribs are long, curved bones that  wrap around your chest and attach to your spine and your breastbone. The ribs protect your heart, lungs, and other organs in the chest. A broken or cracked rib is often painful but is not usually serious. Most rib fractures heal on their own over time. However, rib fractures can be more serious if multiple ribs are broken or if broken ribs move out of place and push against other structures or organs. What are the causes? This condition is caused by:  Repetitive movements with high force, such as pitching a baseball or having severe coughing spells.  A direct blow to the chest, such as a sports injury, a car accident, or a fall.  Cancer that has spread to the bones, which can weaken bones and cause them to break. What are the signs or symptoms? Symptoms of this condition include:  Pain when you breathe in or cough.  Pain when someone presses on the injured area.  Feeling short of breath. How is this diagnosed? This condition is diagnosed with a physical exam and medical history. Imaging tests may also be done, such as:  Chest X-ray.  CT scan.  MRI.  Bone scan.  Chest ultrasound. How is this treated? Treatment for this condition depends on the severity of the fracture. Most rib fractures usually heal on their own in 1-3 months. Sometimes healing takes longer if there is a cough that does not stop or if there are other activities that make the injury worse (aggravating factors). While you heal, you will be given medicines to control the pain. You will also be taught deep breathing exercises. Severe injuries may require hospitalization or surgery. Follow these instructions at home: Managing pain, stiffness, and swelling  If directed, apply ice to the injured area. ? Put ice in a plastic bag. ? Place a towel between your skin and the bag. ? Leave the ice on for 20 minutes, 2-3 times a day.  Take over-the-counter and prescription medicines only as told by your health care  provider. Activity  Avoid a lot of activity and any activities or movements that cause pain. Be careful during activities and avoid bumping the injured rib.  Slowly increase your activity as told by your health care provider. General instructions  Do deep breathing exercises as told by your health care provider. This helps prevent pneumonia, which is a common complication of a broken rib. Your health care provider may instruct you to: ? Take deep breaths several times a day. ? Try to cough several times a day, holding a pillow against the injured area. ? Use a device called incentive spirometer to practice deep breathing several times a day.  Drink enough fluid to keep your urine pale yellow.  Do not wear a rib belt or binder. These restrict breathing, which can lead to pneumonia.  Keep all follow-up visits as told by your health care provider. This is important. Contact a health care provider if:  You have a fever. Get help right away if:  You have difficulty breathing or you are short of breath.  You develop a cough that does   not stop, or you cough up thick or bloody sputum.  You have nausea, vomiting, or pain in your abdomen.  Your pain gets worse and medicine does not help. Summary  A rib fracture is a break or crack in one of the bones of the ribs.  A broken or cracked rib is often painful but is not usually serious.  Most rib fractures heal on their own over time.  Treatment for this condition depends on the severity of the fracture.  Avoid a lot of activity and any activities or movements that cause pain. This information is not intended to replace advice given to you by your health care provider. Make sure you discuss any questions you have with your health care provider. Document Released: 08/11/2005 Document Revised: 11/10/2016 Document Reviewed: 11/10/2016 Elsevier Interactive Patient Education  2019 Elsevier Inc.   Sternal Fracture  A sternal fracture is a  break in the bone in the center of your chest (sternum or breastbone). This fracture is not dangerous unless there is also an injury to your heart or lungs, which are protected by the sternum and ribs. What are the causes? This condition is usually caused by a forceful injury from:  Motor vehicle collisions. This is the most common cause.  Contact sports.  Physical assaults. You can also have a sternal fracture without having a forceful injury if the bone becomes weakened over time (stress fracture or insufficiency fracture). What increases the risk? You may be at greater risk for a sternal fracture if you:  Participate in direct contact sports, such as football or wrestling.  Work at elevated heights, such as in Architect. Other risk factors for a stress or insufficiency sternal fracture include:  Being female.  Being a postmenopausal woman.  Being age 24 or older.  Having osteoporosis.  Having severe curvature of the spine.  Being on long-term steroid treatment. What are the signs or symptoms? Symptoms of this condition include:  Pain over the sternum.  Pain when pressing on the sternum.  Pain that gets worse with deep breathing or coughing.  Shortness of breath.  Bruising.  Swelling.  A crackling sound when taking a deep breath or pressing on the sternum. How is this diagnosed? This condition is diagnosed with a medical history and physical exam. You may also have imaging tests, including:  CT scan.  Ultrasound.  Chest X-rays that are taken from a side view. Your health care provider may check your blood oxygen level with a pulse oximetry test. You may also have repeated electrocardiograms (ECGs) to make sure that your heart has not been injured. You may also have a blood test to check for damage to your heart muscle. How is this treated? Treatment depends on the severity of your injury. A sternal fracture without any other injury (isolated sternal  fracture) usually heals without treatment. You may need to limit (restrict) some activities at home and take medicine for pain relief. In rare cases, you may need surgery to repair a sternal fracture that continues to cause severe pain or a sternal fracture that involves bones that have been moved out of position considerably (displaced fracture). Follow these instructions at home:  Take over-the-counter and prescription medicines only as told by your health care provider.  Rest at home. Return to your normal activities as told by your health care provider. Ask your health care provider what activities are safe for you.  If directed, apply ice to the injured area: ? Put ice in a  plastic bag. ? Place a towel between your skin and the bag. ? Leave the ice on for 20 minutes, 2-3 times a day.  Do not lift anything that is heavier than 10 lb (4.5 kg) until your health care provider says it is safe.  Do not drive or operate heavy machinery while taking prescription pain medicine.  Do not use any tobacco products, such as cigarettes, chewing tobacco, and e-cigarettes. If you need help quitting, ask your health care provider.  Keep all follow-up visits as told by your health care provider. This is important. Contact a health care provider if:  Your pain medicine is not helping.  You continue to have pain after several weeks.  You develop a fever.  You develop a cough and you have thick or bloody mucus (sputum). Get help right away if:  You have difficulty breathing.  You have chest pain.  You have an abnormal heartbeat (palpitations).  You feel nauseous or you have pain in your abdomen. This information is not intended to replace advice given to you by your health care provider. Make sure you discuss any questions you have with your health care provider. Document Released: 03/25/2004 Document Revised: 04/08/2016 Document Reviewed: 03/07/2015 Elsevier Interactive Patient Education   2019 [employer].    How To Use an Chiropodist Use 3-4 times an hour while awake An incentive spirometer is a tool that measures how well you are filling your lungs with each breath. Learning to take long, deep breaths using this tool can help you keep your lungs clear and active. This may help to reverse or lessen your chance of developing breathing (pulmonary) problems, especially infection. You may be asked to use a spirometer:  After a surgery.  If you have a lung problem or a history of smoking.  After a long period of time when you have been unable to move or be active. If the spirometer includes an indicator to show the highest number that you have reached, your health care provider or respiratory therapist will help you set a goal. Keep a list (log) of your progress as told by your health care provider. What are the risks?  Breathing too quickly may cause dizziness or cause you to pass out. Take your time so you do not get dizzy or light-headed.  If you are in pain, you may need to take pain medicine before doing incentive spirometry. It is harder to take a deep breath if you are having pain. How to use your incentive spirometer  1. Sit up on the edge of your bed or on a chair. 2. Hold the incentive spirometer so that it is in an upright position. 3. Before you use the spirometer, breathe out normally. 4. Place the mouthpiece in your mouth. Make sure your lips are closed tightly around it. 5. Breathe in slowly and as deeply as you can through your mouth, causing the piston or the ball to rise toward the top of the chamber. 6. Hold your breath for 3-5 seconds, or for as long as possible. ? If the spirometer includes a coach indicator, use this to guide you in breathing. Slow down your breathing if the indicator goes above the marked areas. 7. Remove the mouthpiece from your mouth and breathe out normally. The piston or ball will return to the bottom of the  chamber. 8. Rest for a few seconds, then repeat the steps 10 or more times. ? Take your time and take a few normal breaths  between deep breaths so that you do not get dizzy or light-headed. ? Do this every 1-2 hours when you are awake. 9. If the spirometer includes a goal marker to show the highest number you have reached (best effort), use this as a goal to work toward during each repetition. 10. After each set of 10 deep breaths, cough a few times. This will help to make sure that your lungs are clear. ? If you have an incision on your chest or abdomen from surgery, place a pillow or a rolled-up towel firmly against the incision when you cough. This can help to reduce pain from coughing. General tips  When you become able to get out of bed, walk around often and continue to cough to help clear your lungs.  Keep using the incentive spirometer until your health care provider says it is okay to stop using it. If you have been in the hospital, you may be told to keep using the spirometer at home. Contact a health care provider if:  You are having difficulty using the spirometer.  You have trouble using the spirometer as often as instructed.  Your pain medicine is not giving enough relief for you to use the spirometer as told.  You have a fever.  You develop shortness of breath. Get help right away if:  You develop a cough with bloody mucus from the lungs (bloody sputum).  You have fluid or blood coming from an incision site after you cough. Summary  An incentive spirometer is a tool that can help you learn to take long, deep breaths to keep your lungs clear and active.  You may be asked to use a spirometer after a surgery, if you have a lung problem or a history of smoking, or if you have been inactive for a long period of time.  Use your incentive spirometer as instructed every 1-2 hours while you are awake.  If you have an incision on your chest or abdomen, place a pillow or a  rolled-up towel firmly against your incision when you cough. This will help to reduce pain. This information is not intended to replace advice given to you by your health care provider. Make sure you discuss any questions you have with your health care provider. Document Released: 12/22/2006 Document Revised: 09/03/2017 Document Reviewed: 06/24/2017 Elsevier Interactive Patient Education  2019 [employer].

## 2019-01-20 NOTE — TOC Transition Note (Signed)
Transition of Care [facility]) - CM/SW Discharge Note   Patient Details  Name: Debbie [name] MRN: [id number] Date of Birth: [birth year]  [facility] of [facility]) CM/SW Contact:  Ella Bodo, RN Phone Number: 01/20/2019, 4:04 PM   Clinical Narrative:    Pt medically stable for discharge home with daughter, [name].  Spoke with daughter, [name], and confirmed that she would be able to provide 24h care at dc for next 2 weeks.  Notified [facility] of dc today; start of care 24-48h post dc date.  Pt has needed DME in room.  Pt able to work with PT/OT prior to dc; recommendation remains home with [facility] services and 24h supervision.  Pt states she would like to go home.  [name] aware to bring portable O2 tank for transport home.    Final next level of care: [place] Barriers to Discharge: No Barriers Identified   Patient Goals and CMS Choice Patient states their goals for this hospitalization and ongoing recovery are:: to get back home CMS Medicare.gov Compare Post Acute Care list provided to:: Patient Choice offered to / list presented to : Patient                        Discharge Plan and Services   Discharge Planning Services: CM Consult Post Acute Care Choice: Home Health          DME Arranged: 3-N-1, Walker rolling DME Agency: [employer] Date DME Agency Contacted: 01/18/19 Time DME Agency Contacted: [id number] Representative spoke with at DME Agency: [place]: Nurse's Aide [place]: [place] ([place]) Date [place]: 01/18/19 Time [place]: 1311 Representative spoke with at [place]: Neoma Laming    Readmission Risk Interventions Readmission Risk Prevention Plan 01/20/2019  Post Dischage Appt Complete  Medication Screening Complete  Transportation Screening Complete  Some recent data might be hidden   Reinaldo Raddle, RN, BSN  Trauma/Neuro ICU Case Manager [phone]
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
## 2019-01-24 DIAGNOSIS — N281 Cyst of kidney, acquired: Secondary | ICD-10-CM | POA: Diagnosis not present

## 2019-01-24 DIAGNOSIS — S7001XD Contusion of right hip, subsequent encounter: Secondary | ICD-10-CM | POA: Diagnosis not present

## 2019-01-24 DIAGNOSIS — J439 Emphysema, unspecified: Secondary | ICD-10-CM | POA: Diagnosis not present

## 2019-01-24 DIAGNOSIS — R911 Solitary pulmonary nodule: Secondary | ICD-10-CM | POA: Diagnosis not present

## 2019-01-24 DIAGNOSIS — S2220XD Unspecified fracture of sternum, subsequent encounter for fracture with routine healing: Secondary | ICD-10-CM | POA: Diagnosis not present

## 2019-01-24 DIAGNOSIS — S2231XD Fracture of one rib, right side, subsequent encounter for fracture with routine healing: Secondary | ICD-10-CM | POA: Diagnosis not present

## 2019-01-24 DIAGNOSIS — Z9981 Dependence on supplemental oxygen: Secondary | ICD-10-CM | POA: Diagnosis not present

## 2019-01-24 DIAGNOSIS — I10 Essential (primary) hypertension: Secondary | ICD-10-CM | POA: Diagnosis not present

## 2019-01-25 DIAGNOSIS — S2231XD Fracture of one rib, right side, subsequent encounter for fracture with routine healing: Secondary | ICD-10-CM | POA: Diagnosis not present

## 2019-01-25 DIAGNOSIS — S2220XD Unspecified fracture of sternum, subsequent encounter for fracture with routine healing: Secondary | ICD-10-CM | POA: Diagnosis not present

## 2019-01-25 DIAGNOSIS — J439 Emphysema, unspecified: Secondary | ICD-10-CM | POA: Diagnosis not present

## 2019-01-25 DIAGNOSIS — Z9981 Dependence on supplemental oxygen: Secondary | ICD-10-CM | POA: Diagnosis not present

## 2019-01-25 DIAGNOSIS — I10 Essential (primary) hypertension: Secondary | ICD-10-CM | POA: Diagnosis not present

## 2019-01-25 DIAGNOSIS — R911 Solitary pulmonary nodule: Secondary | ICD-10-CM | POA: Diagnosis not present

## 2019-01-25 DIAGNOSIS — N281 Cyst of kidney, acquired: Secondary | ICD-10-CM | POA: Diagnosis not present

## 2019-01-25 DIAGNOSIS — S7001XD Contusion of right hip, subsequent encounter: Secondary | ICD-10-CM | POA: Diagnosis not present

## 2019-01-27 DIAGNOSIS — S2231XD Fracture of one rib, right side, subsequent encounter for fracture with routine healing: Secondary | ICD-10-CM | POA: Diagnosis not present

## 2019-01-27 DIAGNOSIS — S2220XD Unspecified fracture of sternum, subsequent encounter for fracture with routine healing: Secondary | ICD-10-CM | POA: Diagnosis not present

## 2019-01-27 DIAGNOSIS — J439 Emphysema, unspecified: Secondary | ICD-10-CM | POA: Diagnosis not present

## 2019-01-27 DIAGNOSIS — R911 Solitary pulmonary nodule: Secondary | ICD-10-CM | POA: Diagnosis not present

## 2019-01-27 DIAGNOSIS — Z9981 Dependence on supplemental oxygen: Secondary | ICD-10-CM | POA: Diagnosis not present

## 2019-01-27 DIAGNOSIS — S7001XD Contusion of right hip, subsequent encounter: Secondary | ICD-10-CM | POA: Diagnosis not present

## 2019-01-27 DIAGNOSIS — I10 Essential (primary) hypertension: Secondary | ICD-10-CM | POA: Diagnosis not present

## 2019-01-27 DIAGNOSIS — N281 Cyst of kidney, acquired: Secondary | ICD-10-CM | POA: Diagnosis not present

## 2019-01-31 ENCOUNTER — Telehealth: Payer: Self-pay | Admitting: Plastic Surgery

## 2019-01-31 DIAGNOSIS — I10 Essential (primary) hypertension: Secondary | ICD-10-CM | POA: Diagnosis not present

## 2019-01-31 DIAGNOSIS — Z9981 Dependence on supplemental oxygen: Secondary | ICD-10-CM | POA: Diagnosis not present

## 2019-01-31 DIAGNOSIS — S2231XD Fracture of one rib, right side, subsequent encounter for fracture with routine healing: Secondary | ICD-10-CM | POA: Diagnosis not present

## 2019-01-31 DIAGNOSIS — J439 Emphysema, unspecified: Secondary | ICD-10-CM | POA: Diagnosis not present

## 2019-01-31 DIAGNOSIS — R911 Solitary pulmonary nodule: Secondary | ICD-10-CM | POA: Diagnosis not present

## 2019-01-31 DIAGNOSIS — S7001XD Contusion of right hip, subsequent encounter: Secondary | ICD-10-CM | POA: Diagnosis not present

## 2019-01-31 DIAGNOSIS — N281 Cyst of kidney, acquired: Secondary | ICD-10-CM | POA: Diagnosis not present

## 2019-01-31 DIAGNOSIS — S2220XD Unspecified fracture of sternum, subsequent encounter for fracture with routine healing: Secondary | ICD-10-CM | POA: Diagnosis not present

## 2019-01-31 NOTE — Telephone Encounter (Signed)
Called patient to confirm appointment scheduled for tomorrow. Patient's daughter answered the following questions: 1. To the best of your knowledge, have you been in close contact with any one with a confirmed diagnosis of COVID-19? No 2. Have you had any one or more of the following; fever, chills, cough, shortness of breath, or any flu-like symptoms? No 3. Have you been diagnosed with or have a previous diagnosis of COVID 19? No 4. I am going to go over a few other symptoms with you. Please let me know if you are experiencing any of the following: None of the below a. Ear, nose, or throat discomfort b. A sore throat c. Headache d. Muscle pain e. Diarrhea f. Loss of taste or smell   

## 2019-02-01 ENCOUNTER — Other Ambulatory Visit: Payer: Self-pay

## 2019-02-01 ENCOUNTER — Ambulatory Visit: Payer: Medicare Other | Admitting: Plastic Surgery

## 2019-02-01 ENCOUNTER — Encounter: Payer: Self-pay | Admitting: Plastic Surgery

## 2019-02-01 DIAGNOSIS — C44609 Unspecified malignant neoplasm of skin of left upper limb, including shoulder: Secondary | ICD-10-CM | POA: Diagnosis not present

## 2019-02-01 DIAGNOSIS — S2231XD Fracture of one rib, right side, subsequent encounter for fracture with routine healing: Secondary | ICD-10-CM | POA: Diagnosis not present

## 2019-02-01 DIAGNOSIS — N281 Cyst of kidney, acquired: Secondary | ICD-10-CM | POA: Diagnosis not present

## 2019-02-01 DIAGNOSIS — S2220XD Unspecified fracture of sternum, subsequent encounter for fracture with routine healing: Secondary | ICD-10-CM | POA: Diagnosis not present

## 2019-02-01 DIAGNOSIS — I10 Essential (primary) hypertension: Secondary | ICD-10-CM | POA: Diagnosis not present

## 2019-02-01 DIAGNOSIS — Z9981 Dependence on supplemental oxygen: Secondary | ICD-10-CM | POA: Diagnosis not present

## 2019-02-01 DIAGNOSIS — J439 Emphysema, unspecified: Secondary | ICD-10-CM | POA: Diagnosis not present

## 2019-02-01 DIAGNOSIS — R911 Solitary pulmonary nodule: Secondary | ICD-10-CM | POA: Diagnosis not present

## 2019-02-01 DIAGNOSIS — S7001XD Contusion of right hip, subsequent encounter: Secondary | ICD-10-CM | POA: Diagnosis not present

## 2019-02-01 NOTE — Progress Notes (Signed)
Patient ID: Debbie [name], female    DOB: February 29, 1946, 73 y.o.   MRN: [id number]   Chief Complaint  Patient presents with  . Advice Only    for carcinoma of (L) thumb    The patient is a 73 yrs old wf here with her daughter for evaluation of her left thumb.  She was seen for a growing lesion and soreness of the nail bed of the thumb.  The biopsy showed a squamous cell carcinoma according to the daughter.  She is on home oxygen and has it on during the visit today.  She is also in a wheel chair.  She has chronic lung disease and    Review of Systems  Constitutional: Negative.   HENT: Negative.   Eyes: Negative.   Respiratory: Positive for shortness of breath. Negative for chest tightness.   Cardiovascular: Negative.   Gastrointestinal: Negative.   Endocrine: Negative.   Genitourinary: Negative.   Musculoskeletal: Positive for gait problem.  Hematological: Negative.   Psychiatric/Behavioral: Negative.     Past Medical History:  Diagnosis Date  . COPD (chronic obstructive pulmonary disease) ([place])   . Hypertension   . Rheumatic fever   . Shingles     Past Surgical History:  Procedure Laterality Date  . CHOLECYSTECTOMY        Current Outpatient Medications:  .  acetaminophen (TYLENOL) 325 MG tablet, Take 1 tablet (325 mg total) by mouth every 6 (six) hours as needed for mild pain or headache., Disp: , Rfl:  .  albuterol (PROVENTIL HFA;VENTOLIN HFA) 108 (90 Base) MCG/ACT inhaler, Inhale 1 puff into the lungs every 6 (six) hours as needed for wheezing or shortness of breath., Disp: , Rfl:  .  amLODipine (NORVASC) 5 MG tablet, Take 5 mg by mouth at bedtime., Disp: , Rfl: 5 .  Ascorbic Acid (VITAMIN C PO), Take 500 mg by mouth daily. , Disp: , Rfl:  .  docusate sodium (COLACE) 100 MG capsule, Take 1 capsule (100 mg total) by mouth 2 (two) times daily., Disp: 10 capsule, Rfl: 0 .  furosemide (LASIX) 20 MG tablet, Take 1 tablet (20 mg total) by mouth daily., Disp: 20 tablet, Rfl:  0 .  ipratropium-albuterol (DUONEB) 0.5-2.5 (3) MG/3ML SOLN, Take 3 mLs by nebulization every 6 (six) hours as needed (shortness of breath/wheezing). , Disp: , Rfl:  .  methocarbamol (ROBAXIN) 500 MG tablet, Take 1 tablet (500 mg total) by mouth every 8 (eight) hours., Disp: 30 tablet, Rfl: 1 .  metoprolol succinate (TOPROL-XL) 100 MG 24 hr tablet, Take 100 mg by mouth daily. Take with or immediately following a meal., Disp: , Rfl:  .  Propylene Glycol (SYSTANE COMPLETE) 0.6 % SOLN, Apply 1 drop to eye as needed (dry eyes)., Disp: , Rfl:  .  SYMBICORT 160-4.5 MCG/ACT inhaler, Inhale 1 puff into the lungs 2 (two) times daily., Disp: , Rfl:  .  traMADol (ULTRAM) 50 MG tablet, Take 1 tablet (50 mg total) by mouth every 6 (six) hours., Disp: 20 tablet, Rfl: 0   Objective:   Vitals:   02/01/19 1037  BP: 124/69  Pulse: 92  Temp: 98.8 F (37.1 C)  SpO2: 91%    Physical Exam Vitals signs and nursing note reviewed.  Constitutional:      Appearance: Normal appearance.  HENT:     Head: Normocephalic.  Cardiovascular:     Rate and Rhythm: Normal rate.  Pulmonary:     Effort: Pulmonary effort is normal.  Comments: On home O2 Abdominal:     General: Abdomen is flat. There is no distension.  Musculoskeletal:       Arms:  Neurological:     Mental Status: She is alert. Mental status is at baseline.  Psychiatric:        Mood and Affect: Mood normal.        Behavior: Behavior normal.     Assessment & Plan:  Cancer of skin of left thumb  Will work with [facility] for left thumb reconstruction with local flap possible Acell placement.  Pictures were obtained of the patient and placed in the chart with the patient's or guardian's permission.   [place], DO

## 2019-02-02 DIAGNOSIS — S2231XD Fracture of one rib, right side, subsequent encounter for fracture with routine healing: Secondary | ICD-10-CM | POA: Diagnosis not present

## 2019-02-02 DIAGNOSIS — N281 Cyst of kidney, acquired: Secondary | ICD-10-CM | POA: Diagnosis not present

## 2019-02-02 DIAGNOSIS — S7001XD Contusion of right hip, subsequent encounter: Secondary | ICD-10-CM | POA: Diagnosis not present

## 2019-02-02 DIAGNOSIS — Z9981 Dependence on supplemental oxygen: Secondary | ICD-10-CM | POA: Diagnosis not present

## 2019-02-02 DIAGNOSIS — J439 Emphysema, unspecified: Secondary | ICD-10-CM | POA: Diagnosis not present

## 2019-02-02 DIAGNOSIS — I10 Essential (primary) hypertension: Secondary | ICD-10-CM | POA: Diagnosis not present

## 2019-02-02 DIAGNOSIS — S2220XD Unspecified fracture of sternum, subsequent encounter for fracture with routine healing: Secondary | ICD-10-CM | POA: Diagnosis not present

## 2019-02-02 DIAGNOSIS — R911 Solitary pulmonary nodule: Secondary | ICD-10-CM | POA: Diagnosis not present

## 2019-02-03 ENCOUNTER — Telehealth (HOSPITAL_COMMUNITY): Payer: Self-pay

## 2019-02-03 DIAGNOSIS — Z9981 Dependence on supplemental oxygen: Secondary | ICD-10-CM | POA: Diagnosis not present

## 2019-02-03 DIAGNOSIS — I10 Essential (primary) hypertension: Secondary | ICD-10-CM | POA: Diagnosis not present

## 2019-02-03 DIAGNOSIS — S2220XD Unspecified fracture of sternum, subsequent encounter for fracture with routine healing: Secondary | ICD-10-CM | POA: Diagnosis not present

## 2019-02-03 DIAGNOSIS — S2231XD Fracture of one rib, right side, subsequent encounter for fracture with routine healing: Secondary | ICD-10-CM | POA: Diagnosis not present

## 2019-02-03 DIAGNOSIS — J439 Emphysema, unspecified: Secondary | ICD-10-CM | POA: Diagnosis not present

## 2019-02-03 DIAGNOSIS — S7001XD Contusion of right hip, subsequent encounter: Secondary | ICD-10-CM | POA: Diagnosis not present

## 2019-02-03 DIAGNOSIS — R911 Solitary pulmonary nodule: Secondary | ICD-10-CM | POA: Diagnosis not present

## 2019-02-03 DIAGNOSIS — N281 Cyst of kidney, acquired: Secondary | ICD-10-CM | POA: Diagnosis not present

## 2019-02-03 NOTE — Telephone Encounter (Signed)
Patient did not follow up in trauma clinic. Recommend that they contact PCP for assistance with FMLA paperwork.

## 2019-02-03 NOTE — Telephone Encounter (Signed)
Hi,  This patient's daughter Garwin Brothers [phone]) is trying to get some FMLA paperwork completed so she can stay home take care of Debbie [name]. Please advise who should complete the paper work.  Thanks, Tressia Miners  [phone]

## 2019-02-04 DIAGNOSIS — Z9981 Dependence on supplemental oxygen: Secondary | ICD-10-CM | POA: Diagnosis not present

## 2019-02-04 DIAGNOSIS — S7001XD Contusion of right hip, subsequent encounter: Secondary | ICD-10-CM | POA: Diagnosis not present

## 2019-02-04 DIAGNOSIS — N281 Cyst of kidney, acquired: Secondary | ICD-10-CM | POA: Diagnosis not present

## 2019-02-04 DIAGNOSIS — S2231XD Fracture of one rib, right side, subsequent encounter for fracture with routine healing: Secondary | ICD-10-CM | POA: Diagnosis not present

## 2019-02-04 DIAGNOSIS — R911 Solitary pulmonary nodule: Secondary | ICD-10-CM | POA: Diagnosis not present

## 2019-02-04 DIAGNOSIS — S2220XD Unspecified fracture of sternum, subsequent encounter for fracture with routine healing: Secondary | ICD-10-CM | POA: Diagnosis not present

## 2019-02-04 DIAGNOSIS — J439 Emphysema, unspecified: Secondary | ICD-10-CM | POA: Diagnosis not present

## 2019-02-04 DIAGNOSIS — I10 Essential (primary) hypertension: Secondary | ICD-10-CM | POA: Diagnosis not present

## 2019-02-07 DIAGNOSIS — S7001XD Contusion of right hip, subsequent encounter: Secondary | ICD-10-CM | POA: Diagnosis not present

## 2019-02-07 DIAGNOSIS — J439 Emphysema, unspecified: Secondary | ICD-10-CM | POA: Diagnosis not present

## 2019-02-07 DIAGNOSIS — R911 Solitary pulmonary nodule: Secondary | ICD-10-CM | POA: Diagnosis not present

## 2019-02-07 DIAGNOSIS — Z9981 Dependence on supplemental oxygen: Secondary | ICD-10-CM | POA: Diagnosis not present

## 2019-02-07 DIAGNOSIS — I10 Essential (primary) hypertension: Secondary | ICD-10-CM | POA: Diagnosis not present

## 2019-02-07 DIAGNOSIS — S2220XD Unspecified fracture of sternum, subsequent encounter for fracture with routine healing: Secondary | ICD-10-CM | POA: Diagnosis not present

## 2019-02-07 DIAGNOSIS — S2231XD Fracture of one rib, right side, subsequent encounter for fracture with routine healing: Secondary | ICD-10-CM | POA: Diagnosis not present

## 2019-02-07 DIAGNOSIS — N281 Cyst of kidney, acquired: Secondary | ICD-10-CM | POA: Diagnosis not present

## 2019-02-08 DIAGNOSIS — S2220XD Unspecified fracture of sternum, subsequent encounter for fracture with routine healing: Secondary | ICD-10-CM | POA: Diagnosis not present

## 2019-02-08 DIAGNOSIS — I2510 Atherosclerotic heart disease of native coronary artery without angina pectoris: Secondary | ICD-10-CM | POA: Diagnosis not present

## 2019-02-08 DIAGNOSIS — J441 Chronic obstructive pulmonary disease with (acute) exacerbation: Secondary | ICD-10-CM | POA: Diagnosis not present

## 2019-02-08 DIAGNOSIS — N281 Cyst of kidney, acquired: Secondary | ICD-10-CM | POA: Diagnosis not present

## 2019-02-08 DIAGNOSIS — S2231XD Fracture of one rib, right side, subsequent encounter for fracture with routine healing: Secondary | ICD-10-CM | POA: Diagnosis not present

## 2019-02-11 DIAGNOSIS — R911 Solitary pulmonary nodule: Secondary | ICD-10-CM | POA: Diagnosis not present

## 2019-02-11 DIAGNOSIS — N281 Cyst of kidney, acquired: Secondary | ICD-10-CM | POA: Diagnosis not present

## 2019-02-11 DIAGNOSIS — I10 Essential (primary) hypertension: Secondary | ICD-10-CM | POA: Diagnosis not present

## 2019-02-11 DIAGNOSIS — J439 Emphysema, unspecified: Secondary | ICD-10-CM | POA: Diagnosis not present

## 2019-02-11 DIAGNOSIS — S7001XD Contusion of right hip, subsequent encounter: Secondary | ICD-10-CM | POA: Diagnosis not present

## 2019-02-11 DIAGNOSIS — S2231XD Fracture of one rib, right side, subsequent encounter for fracture with routine healing: Secondary | ICD-10-CM | POA: Diagnosis not present

## 2019-02-11 DIAGNOSIS — J449 Chronic obstructive pulmonary disease, unspecified: Secondary | ICD-10-CM | POA: Diagnosis not present

## 2019-02-11 DIAGNOSIS — Z9981 Dependence on supplemental oxygen: Secondary | ICD-10-CM | POA: Diagnosis not present

## 2019-02-11 DIAGNOSIS — S2220XD Unspecified fracture of sternum, subsequent encounter for fracture with routine healing: Secondary | ICD-10-CM | POA: Diagnosis not present

## 2019-02-14 DIAGNOSIS — S2220XD Unspecified fracture of sternum, subsequent encounter for fracture with routine healing: Secondary | ICD-10-CM | POA: Diagnosis not present

## 2019-02-14 DIAGNOSIS — N281 Cyst of kidney, acquired: Secondary | ICD-10-CM | POA: Diagnosis not present

## 2019-02-14 DIAGNOSIS — J439 Emphysema, unspecified: Secondary | ICD-10-CM | POA: Diagnosis not present

## 2019-02-14 DIAGNOSIS — Z9981 Dependence on supplemental oxygen: Secondary | ICD-10-CM | POA: Diagnosis not present

## 2019-02-14 DIAGNOSIS — I10 Essential (primary) hypertension: Secondary | ICD-10-CM | POA: Diagnosis not present

## 2019-02-14 DIAGNOSIS — S2231XD Fracture of one rib, right side, subsequent encounter for fracture with routine healing: Secondary | ICD-10-CM | POA: Diagnosis not present

## 2019-02-14 DIAGNOSIS — R911 Solitary pulmonary nodule: Secondary | ICD-10-CM | POA: Diagnosis not present

## 2019-02-14 DIAGNOSIS — S7001XD Contusion of right hip, subsequent encounter: Secondary | ICD-10-CM | POA: Diagnosis not present

## 2019-02-15 DIAGNOSIS — S2220XA Unspecified fracture of sternum, initial encounter for closed fracture: Secondary | ICD-10-CM | POA: Diagnosis not present

## 2019-02-17 DIAGNOSIS — I10 Essential (primary) hypertension: Secondary | ICD-10-CM | POA: Diagnosis not present

## 2019-02-17 DIAGNOSIS — J441 Chronic obstructive pulmonary disease with (acute) exacerbation: Secondary | ICD-10-CM | POA: Diagnosis not present

## 2019-02-17 DIAGNOSIS — J449 Chronic obstructive pulmonary disease, unspecified: Secondary | ICD-10-CM | POA: Diagnosis not present

## 2019-02-17 DIAGNOSIS — I2510 Atherosclerotic heart disease of native coronary artery without angina pectoris: Secondary | ICD-10-CM | POA: Diagnosis not present

## 2019-02-18 DIAGNOSIS — S2220XD Unspecified fracture of sternum, subsequent encounter for fracture with routine healing: Secondary | ICD-10-CM | POA: Diagnosis not present

## 2019-02-18 DIAGNOSIS — S2231XD Fracture of one rib, right side, subsequent encounter for fracture with routine healing: Secondary | ICD-10-CM | POA: Diagnosis not present

## 2019-02-18 DIAGNOSIS — I10 Essential (primary) hypertension: Secondary | ICD-10-CM | POA: Diagnosis not present

## 2019-02-18 DIAGNOSIS — S7001XD Contusion of right hip, subsequent encounter: Secondary | ICD-10-CM | POA: Diagnosis not present

## 2019-02-18 DIAGNOSIS — R911 Solitary pulmonary nodule: Secondary | ICD-10-CM | POA: Diagnosis not present

## 2019-02-18 DIAGNOSIS — Z9981 Dependence on supplemental oxygen: Secondary | ICD-10-CM | POA: Diagnosis not present

## 2019-02-18 DIAGNOSIS — J439 Emphysema, unspecified: Secondary | ICD-10-CM | POA: Diagnosis not present

## 2019-02-18 DIAGNOSIS — N281 Cyst of kidney, acquired: Secondary | ICD-10-CM | POA: Diagnosis not present

## 2019-03-08 ENCOUNTER — Other Ambulatory Visit (HOSPITAL_COMMUNITY): Payer: Medicare Other

## 2019-03-13 DIAGNOSIS — J449 Chronic obstructive pulmonary disease, unspecified: Secondary | ICD-10-CM | POA: Diagnosis not present

## 2019-03-14 ENCOUNTER — Telehealth: Payer: Self-pay | Admitting: Plastic Surgery

## 2019-03-14 ENCOUNTER — Other Ambulatory Visit (HOSPITAL_COMMUNITY): Payer: Medicare Other

## 2019-03-14 NOTE — Progress Notes (Signed)
[place], [place] [address] [place] [address] Phone: [phone] Fax: [phone]      Your procedure is scheduled on July 23  Report to [facility] Main Entrance "A" at 0530 A.M., and check in at the Admitting office.  Call this number if you have problems the morning of surgery:  [phone]  Call [phone] if you have any questions prior to your surgery date Monday-Friday 8am-4pm    Remember:  Do not eat or drink after midnight the night before your surgery     Take these medicines the morning of surgery with A SIP OF WATER  acetaminophen (TYLENOL)  If needed albuterol (PROVENTIL HFA;VENTOLIN HFA) if needed Please bring all inhalers with you the day of surgery.  ipratropium-albuterol (DUONEB metoprolol succinate (TOPROL-XL)  SYMBICORT    7 days prior to surgery STOP taking any Aspirin (unless otherwise instructed by your surgeon), Aleve, Naproxen, Ibuprofen, Motrin, Advil, Goody's, BC's, all herbal medications, fish oil, and all vitamins.    The Morning of Surgery  Do not wear jewelry, make-up or nail polish.  Do not wear lotions, powders, or perfumes/colognes, or deodorant  Do not shave 48 hours prior to surgery.  Men may shave face and neck.  Do not bring valuables to the hospital.  [facility] is not responsible for any belongings or valuables.  If you are a smoker, DO NOT Smoke 24 hours prior to surgery IF you wear a CPAP at night please bring your mask, tubing, and machine the morning of surgery   Remember that you must have someone to transport you home after your surgery, and remain with you for 24 hours if you are discharged the same day.   Contacts, glasses, hearing aids, dentures or bridgework may not be worn into surgery.    Leave your suitcase in the car.  After surgery it may be brought to your room.  For patients admitted to the hospital, discharge time will be determined by your treatment  team.  Patients discharged the day of surgery will not be allowed to drive home.    Special instructions:   [facility]- Preparing For Surgery  Before surgery, you can play an important role. Because skin is not sterile, your skin needs to be as free of germs as possible. You can reduce the number of germs on your skin by washing with CHG (chlorahexidine gluconate) Soap before surgery.  CHG is an antiseptic cleaner which kills germs and bonds with the skin to continue killing germs even after washing.    Oral Hygiene is also important to reduce your risk of infection.  Remember - BRUSH YOUR TEETH THE MORNING OF SURGERY WITH YOUR REGULAR TOOTHPASTE  Please do not use if you have an allergy to CHG or antibacterial soaps. If your skin becomes reddened/irritated stop using the CHG.  Do not shave (including legs and underarms) for at least 48 hours prior to first CHG shower. It is OK to shave your face.  Please follow these instructions carefully.   1. Shower the NIGHT BEFORE SURGERY and the MORNING OF SURGERY with CHG Soap.   2. If you chose to wash your hair, wash your hair first as usual with your normal shampoo.  3. After you shampoo, rinse your hair and body thoroughly to remove the shampoo.  4. Use CHG as you would any other liquid soap. You can apply CHG directly to the skin and wash gently with a  scrungie or a clean washcloth.   5. Apply the CHG Soap to your body ONLY FROM THE NECK DOWN.  Do not use on open wounds or open sores. Avoid contact with your eyes, ears, mouth and genitals (private parts). Wash Face and genitals (private parts)  with your normal soap.   6. Wash thoroughly, paying special attention to the area where your surgery will be performed.  7. Thoroughly rinse your body with warm water from the neck down.  8. DO NOT shower/wash with your normal soap after using and rinsing off the CHG Soap.  9. Pat yourself dry with a CLEAN TOWEL.  10. Wear CLEAN PAJAMAS to bed  the night before surgery, wear comfortable clothes the morning of surgery  11. Place CLEAN SHEETS on your bed the night of your first shower and DO NOT SLEEP WITH PETS.    Day of Surgery:  Do not apply any deodorants/lotions. Please shower the morning of surgery with the CHG soap  Please wear clean clothes to the hospital/surgery center.   Remember to brush your teeth WITH YOUR REGULAR TOOTHPASTE.   Please read over the following fact sheets that you were given.

## 2019-03-14 NOTE — Telephone Encounter (Signed)

## 2019-03-15 ENCOUNTER — Encounter: Payer: Medicare Other | Admitting: Surgical

## 2019-03-15 ENCOUNTER — Other Ambulatory Visit (HOSPITAL_COMMUNITY)
Admission: RE | Admit: 2019-03-15 | Discharge: 2019-03-15 | Disposition: A | Discharge status: Home / Self Care | Payer: Medicare Other | Source: Ambulatory Visit | Attending: Plastic Surgery | Admitting: Plastic Surgery

## 2019-03-15 ENCOUNTER — Ambulatory Visit (INDEPENDENT_AMBULATORY_CARE_PROVIDER_SITE_OTHER): Payer: Medicare Other | Admitting: Surgical

## 2019-03-15 ENCOUNTER — Encounter (HOSPITAL_COMMUNITY): Payer: Self-pay | Admitting: *Deleted

## 2019-03-15 ENCOUNTER — Encounter (HOSPITAL_COMMUNITY)
Admission: RE | Admit: 2019-03-15 | Discharge: 2019-03-15 | Disposition: A | Discharge status: Home / Self Care | Payer: Medicare Other | Source: Ambulatory Visit | Attending: Plastic Surgery | Admitting: Plastic Surgery

## 2019-03-15 ENCOUNTER — Other Ambulatory Visit: Payer: Self-pay

## 2019-03-15 ENCOUNTER — Encounter: Payer: Self-pay | Admitting: Surgical

## 2019-03-15 VITALS — BP 175/83 | HR 101 | Temp 98.9°F | Ht 61.5 in | Wt 106.0 lb

## 2019-03-15 DIAGNOSIS — Z1159 Encounter for screening for other viral diseases: Secondary | ICD-10-CM | POA: Insufficient documentation

## 2019-03-15 DIAGNOSIS — C44609 Unspecified malignant neoplasm of skin of left upper limb, including shoulder: Secondary | ICD-10-CM

## 2019-03-15 DIAGNOSIS — Z01812 Encounter for preprocedural laboratory examination: Secondary | ICD-10-CM | POA: Insufficient documentation

## 2019-03-15 LAB — BASIC METABOLIC PANEL
Anion gap: 11 (ref 5–15)
BUN: 5 mg/dL — ABNORMAL LOW (ref 8–23)
CO2: 25 mmol/L (ref 22–32)
Calcium: 9.7 mg/dL (ref 8.9–10.3)
Chloride: 106 mmol/L (ref 98–111)
Creatinine, Ser: 0.57 mg/dL (ref 0.44–1.00)
GFR calc Af Amer: >60 mL/min (ref >60)
GFR calc non Af Amer: >60 mL/min (ref >60)
Glucose, Bld: 100 mg/dL — ABNORMAL HIGH (ref 70–99)
Potassium: 3.3 mmol/L — ABNORMAL LOW (ref 3.5–5.1)
Sodium: 142 mmol/L (ref 135–145)

## 2019-03-15 LAB — SARS CORONAVIRUS 2 (TAT 6-24 HRS): SARS Coronavirus 2: NEGATIVE

## 2019-03-15 LAB — CBC
HCT: 41.8 % (ref 36.0–46.0)
Hemoglobin: 13.6 g/dL (ref 12.0–15.0)
MCH: 31.6 pg (ref 26.0–34.0)
MCHC: 32.5 g/dL (ref 30.0–36.0)
MCV: 97 fL (ref 80.0–100.0)
Platelets: 238 10*3/uL (ref 150–400)
RBC: 4.31 MIL/uL (ref 3.87–5.11)
RDW: 12.6 % (ref 11.5–15.5)
WBC: 7.1 10*3/uL (ref 4.0–10.5)
nRBC: 0 % (ref 0.0–0.2)
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
## 2019-03-15 NOTE — Progress Notes (Signed)
Patient ID: Debbie [name], female    DOB: 1946/05/23, 73 y.o.   MRN: [id number]  Chief Complaint  Patient presents with  . Pre-op Exam    for skin tissue reconstruction of (L) thumb   History of Present Illness: Debbie [name] is a 73 y.o.  female  with a history of squamous cell carcinoma of her left thumb.  She presents for preoperative evaluation for upcoming procedure, left thumb reconstruction with local flap and possible Acell placement., scheduled for 03/17/19 with Dr. Marla Roe.  Debbie [name] is here today with her daughter. She is on home oxygen. She does have COPD and hypertension. She is scheduled for labs and EKG after today's visit for pre-op clearance. She is also going to get her covid test today.  Denies any recent illnesses, colds.  The patient has not had problems with anesthesia.    Past Medical History: Allergies: No Known Allergies  Current Medications:  Current Outpatient Medications:  .  acetaminophen (TYLENOL) 500 MG tablet, Take 500 mg by mouth every 6 (six) hours as needed for moderate pain or headache., Disp: , Rfl:  .  albuterol (PROVENTIL HFA;VENTOLIN HFA) 108 (90 Base) MCG/ACT inhaler, Inhale 1 puff into the lungs every 6 (six) hours as needed for wheezing or shortness of breath., Disp: , Rfl:  .  amLODipine (NORVASC) 5 MG tablet, Take 5 mg by mouth at bedtime., Disp: , Rfl: 5 .  docusate sodium (COLACE) 100 MG capsule, Take 1 capsule (100 mg total) by mouth 2 (two) times daily. (Patient taking differently: Take 100 mg by mouth 2 (two) times daily as needed for mild constipation. ), Disp: 10 capsule, Rfl: 0 .  ipratropium-albuterol (DUONEB) 0.5-2.5 (3) MG/3ML SOLN, Take 3 mLs by nebulization 2 (two) times a day. , Disp: , Rfl:  .  metoprolol succinate (TOPROL-XL) 100 MG 24 hr tablet, Take 100 mg by mouth daily. Take with or immediately following a meal., Disp: , Rfl:  .  Propylene Glycol (SYSTANE COMPLETE) 0.6 % SOLN, Place 2 drops into both eyes daily as  needed (for dry eyes). , Disp: , Rfl:  .  SYMBICORT 160-4.5 MCG/ACT inhaler, Inhale 2 puffs into the lungs 2 (two) times daily. , Disp: , Rfl:  .  vitamin C (ASCORBIC ACID) 500 MG tablet, Take 500 mg by mouth 2 (two) times daily., Disp: , Rfl:   Past Medical Problems: Past Medical History:  Diagnosis Date  . COPD (chronic obstructive pulmonary disease) ([place])   . Hypertension   . Rheumatic fever   . Shingles     Past Surgical History: Past Surgical History:  Procedure Laterality Date  . CHOLECYSTECTOMY      Social History: Social History   Socioeconomic History  . Marital status: Divorced    Spouse name: Not on file  . Number of children: Not on file  . Years of education: Not on file  . Highest education level: Not on file  Occupational History  . Not on file  Social Needs  . Financial resource strain: Not on file  . Food insecurity    Worry: Not on file    Inability: Not on file  . Transportation needs    Medical: Not on file    Non-medical: Not on file  Tobacco Use  . Smoking status: Former Research scientist (life sciences)  . Smokeless tobacco: Never Used  Substance and Sexual Activity  . Alcohol use: No  . Drug use: No  . Sexual activity: Not on file  Lifestyle  . Physical activity    Days per week: Not on file    Minutes per session: Not on file  . Stress: Not on file  Relationships  . Social Herbalist on phone: Not on file    Gets together: Not on file    Attends religious service: Not on file    Active member of club or organization: Not on file    Attends meetings of clubs or organizations: Not on file    Relationship status: Not on file  . Intimate partner violence    Fear of current or ex partner: Not on file    Emotionally abused: Not on file    Physically abused: Not on file    Forced sexual activity: Not on file  Other Topics Concern  . Not on file  Social History Narrative  . Not on file    Family History: History reviewed. No pertinent family  history.  Review of Systems: Review of Systems  Constitutional: Negative for chills, diaphoresis and fever.  Eyes: Negative.   Respiratory: Positive for shortness of breath. Negative for cough and sputum production.   Cardiovascular: Negative.  Negative for chest pain, palpitations and leg swelling.  Gastrointestinal: Negative.   Genitourinary: Negative.   Musculoskeletal: Negative.   Skin: Negative for itching and rash.       + mass/lesion left thumb   Neurological: Negative for dizziness, focal weakness and headaches.  Psychiatric/Behavioral: Negative.     Physical Exam: Vital Signs BP (!) 175/83 (BP Location: Left Arm, Patient Position: Sitting, Cuff Size: Normal)   Pulse (!) 101   Temp 98.9 F (37.2 C) (Temporal)   Ht 5' 1.5" (1.562 m)   Wt 106 lb (48.1 kg)   SpO2 (!) 83% Comment: With 2.50 liters of 02  BMI 19.70 kg/m   Physical Exam Exam conducted with a chaperone present.  Constitutional:      General: She is not in acute distress.    Appearance: Normal appearance. She is not ill-appearing.  HENT:     Head: Normocephalic and atraumatic.  Eyes:     Pupils: Pupils are equal, round Neck:     Musculoskeletal: Normal range of motion.  Cardiovascular:     Rate and Rhythm: Normal rate and regular rhythm.     Pulses: Normal pulses.     Heart sounds: Normal heart sounds. No murmur.  Pulmonary:     Effort: Pulmonary effort is normal. No respiratory distress.     Breath sounds: Normal breath sounds. No wheezing.  Abdominal:     General: Abdomen is flat. There is no distension.     Palpations: Abdomen is soft.     Tenderness: There is no abdominal tenderness.  Musculoskeletal: Normal range of motion.  Skin:    General: Skin is warm and dry.     Findings: No erythema or rash. Left thumb with mass within/on fingernail. Neurological:     General: No focal deficit present.     Mental Status: She is alert and oriented to person, place, and time. Mental status is at  baseline.     Motor: No weakness.  Psychiatric:        Mood and Affect: Mood normal.        Behavior: Behavior normal.    Assessment/Plan: Debbie [name] is scheduled for left thumb reconstruction with local flap and possible Acell placement with Dr. Marla Roe.  Risks, benefits, and alternatives of procedure discussed, questions answered and consent obtained.  She is scheduled this afternoon for her COVID test and pre-op clearance at the hospital.  All of her and her daughter's questions were answered. They understand to quarantine after the test.    Electronically signed by: Charlies Constable, PA-C 03/15/2019 2:10 PM

## 2019-03-15 NOTE — Progress Notes (Signed)
Ms Benda has not arrived for appointment. MT and I have called patient numerous times, I left a voice message. MT checked patient's appointments and saw that patient has a 1420 appointment at Dr Dillingham's office, and had arrived at the appointment.  I have appointments at 2 and 3 pm and office closes at 4 pm. I asked the MT to have patient come and have labs drawn and she will have to be called later for Heath history , I will have Pre Procedure Instructions  instructions and the CHG  Srgical Scrub ready for the patient.

## 2019-03-16 ENCOUNTER — Encounter (HOSPITAL_COMMUNITY): Payer: Self-pay | Admitting: Registered Nurse

## 2019-03-16 ENCOUNTER — Encounter (HOSPITAL_COMMUNITY): Payer: Self-pay | Admitting: Physician Assistant

## 2019-03-16 DIAGNOSIS — L989 Disorder of the skin and subcutaneous tissue, unspecified: Secondary | ICD-10-CM | POA: Diagnosis not present

## 2019-03-16 DIAGNOSIS — C44629 Squamous cell carcinoma of skin of left upper limb, including shoulder: Secondary | ICD-10-CM | POA: Diagnosis not present
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
## 2019-03-17 ENCOUNTER — Ambulatory Visit (HOSPITAL_COMMUNITY): Admission: RE | Admit: 2019-03-17 | Payer: Medicare Other | Source: Ambulatory Visit | Admitting: Plastic Surgery

## 2019-03-17 HISTORY — DX: Malignant (primary) neoplasm, unspecified: C80.1

## 2019-03-17 HISTORY — DX: Unspecified osteoarthritis, unspecified site: M19.90

## 2019-03-17 HISTORY — DX: Pneumonia, unspecified organism: J18.9

## 2019-03-17 HISTORY — DX: Dyspnea, unspecified: R06.00

## 2019-03-17 SURGERY — DEBRIDEMENT, SKIN, PARTIAL-THICKNESS
Anesthesia: Choice | Site: Thumb | Laterality: Left

## 2019-03-18 DIAGNOSIS — J449 Chronic obstructive pulmonary disease, unspecified: Secondary | ICD-10-CM | POA: Diagnosis not present

## 2019-03-18 DIAGNOSIS — J441 Chronic obstructive pulmonary disease with (acute) exacerbation: Secondary | ICD-10-CM | POA: Diagnosis not present

## 2019-03-18 DIAGNOSIS — I10 Essential (primary) hypertension: Secondary | ICD-10-CM | POA: Diagnosis not present

## 2019-03-18 DIAGNOSIS — I2510 Atherosclerotic heart disease of native coronary artery without angina pectoris: Secondary | ICD-10-CM | POA: Diagnosis not present

## 2019-03-24 ENCOUNTER — Encounter: Payer: Medicare Other | Admitting: Plastic Surgery

## 2019-03-24 ENCOUNTER — Telehealth: Payer: Self-pay

## 2019-03-24 NOTE — Telephone Encounter (Signed)
Patient called inquiring about surgery for her hand. Patient also asking about wound care for her hand while waiting for next appointment. Patient requesting call from clinical staff.

## 2019-03-25 NOTE — Telephone Encounter (Signed)
Called and spoke with the patient on (03/24/19) regarding the message below.  She stated that she had surgery on her thumb and she wanted to know how to redress the thumb.  She stated that they packed it with something white and she don't know if she's to take the packing out.  Asked her if Dr. Marla Roe did her surgery and she stated no that Dr. Winifred Olive did.  She said that surgery with Dr. Marla Roe had to be canceled.  I informed the patient that I was not sure of the dressing she was speaking of that was placed on her thumb, so I was not sure if she's to take the packing out of not.  She asked if she should call Dr. Winifred Olive office and ask them about the dressing, and I informed her yes she should call Dr. Winifred Olive office.  She verbalized understanding and agreed.//AB/CMA

## 2019-03-29 ENCOUNTER — Other Ambulatory Visit: Payer: Self-pay | Admitting: Orthopedic Surgery

## 2019-03-29 DIAGNOSIS — C44629 Squamous cell carcinoma of skin of left upper limb, including shoulder: Secondary | ICD-10-CM | POA: Diagnosis not present

## 2019-03-30 ENCOUNTER — Encounter (HOSPITAL_COMMUNITY): Payer: Self-pay | Admitting: *Deleted

## 2019-03-30 ENCOUNTER — Other Ambulatory Visit: Payer: Self-pay
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
## 2019-03-30 NOTE — Progress Notes (Signed)
Pt denies any acute pulmonary issues. Pt stated that she is dependent on supplemental oxygen  ( 2-2.5 liters via [place]). Pt denies being under the care of a cardiologist. Pt denies having a cardiac cath. Pt denies recent labs.  Pt stated that PCP is Dr. Casimer Leek and Pulmonologist is Dr. Luan Pulling. Pt stated that she does not take Aspirin. Pt made aware to stop taking  vitamins, fish oil and herbal medications. Do not take any NSAIDs ie: Ibuprofen, Advil, Naproxen (Aleve), Motrin, BC and Goody Powder.  Pt stated that she is scheduled for COVID-19 test on 03/31/2019; pt reminded to quarantine.   Coronavirus Screening  Have you experienced the following symptoms:  Cough yes/no: No Fever (>100.70F)  yes/no: No Runny nose yes/no: No Sore throat yes/no: No Difficulty breathing/shortness of breath  yes/no: No not acute;pt has chronic pulmonary history  Have you or a family member traveled in the last 14 days and where? yes/no: No  Pt reminded that hospital visitation restrictions are in effect and the importance of the restrictions.   Pt verbalized understanding of all pre-op instructions.  PA, Anesthesiology, asked to review pt history.

## 2019-03-31 ENCOUNTER — Other Ambulatory Visit (HOSPITAL_COMMUNITY)
Admission: RE | Admit: 2019-03-31 | Discharge: 2019-03-31 | Disposition: A | Discharge status: Home / Self Care | Payer: Medicare Other | Source: Ambulatory Visit | Attending: Orthopedic Surgery | Admitting: Orthopedic Surgery

## 2019-03-31 ENCOUNTER — Other Ambulatory Visit: Payer: Self-pay

## 2019-03-31 DIAGNOSIS — Z20828 Contact with and (suspected) exposure to other viral communicable diseases: Secondary | ICD-10-CM | POA: Insufficient documentation

## 2019-03-31 DIAGNOSIS — Z01812 Encounter for preprocedural laboratory examination: Secondary | ICD-10-CM | POA: Diagnosis not present

## 2019-03-31 LAB — SARS CORONAVIRUS 2 (TAT 6-24 HRS): SARS Coronavirus 2: NEGATIVE

## 2019-04-04 ENCOUNTER — Ambulatory Visit (HOSPITAL_COMMUNITY): Payer: Medicare Other | Admitting: Physician Assistant

## 2019-04-04 ENCOUNTER — Other Ambulatory Visit: Payer: Self-pay

## 2019-04-04 ENCOUNTER — Ambulatory Visit (HOSPITAL_COMMUNITY)
Admission: RE | Admit: 2019-04-04 | Discharge: 2019-04-04 | Disposition: A | Discharge status: Home / Self Care | Payer: Medicare Other | Attending: Orthopedic Surgery | Admitting: Orthopedic Surgery

## 2019-04-04 ENCOUNTER — Encounter (HOSPITAL_COMMUNITY): Payer: Self-pay

## 2019-04-04 ENCOUNTER — Encounter (HOSPITAL_COMMUNITY)
Admission: RE | Disposition: A | Discharge status: Home / Self Care | Payer: Self-pay | Source: Home / Self Care | Attending: Orthopedic Surgery

## 2019-04-04 DIAGNOSIS — M19042 Primary osteoarthritis, left hand: Secondary | ICD-10-CM | POA: Insufficient documentation

## 2019-04-04 DIAGNOSIS — C44609 Unspecified malignant neoplasm of skin of left upper limb, including shoulder: Secondary | ICD-10-CM | POA: Diagnosis not present

## 2019-04-04 DIAGNOSIS — Z79899 Other long term (current) drug therapy: Secondary | ICD-10-CM | POA: Insufficient documentation

## 2019-04-04 DIAGNOSIS — Z7951 Long term (current) use of inhaled steroids: Secondary | ICD-10-CM | POA: Diagnosis not present

## 2019-04-04 DIAGNOSIS — Z87891 Personal history of nicotine dependence: Secondary | ICD-10-CM | POA: Insufficient documentation

## 2019-04-04 DIAGNOSIS — Z9981 Dependence on supplemental oxygen: Secondary | ICD-10-CM | POA: Diagnosis not present

## 2019-04-04 DIAGNOSIS — Z9049 Acquired absence of other specified parts of digestive tract: Secondary | ICD-10-CM | POA: Diagnosis not present

## 2019-04-04 DIAGNOSIS — C7642 Malignant neoplasm of left upper limb: Secondary | ICD-10-CM | POA: Diagnosis not present

## 2019-04-04 DIAGNOSIS — M19041 Primary osteoarthritis, right hand: Secondary | ICD-10-CM | POA: Insufficient documentation

## 2019-04-04 DIAGNOSIS — L7682 Other postprocedural complications of skin and subcutaneous tissue: Secondary | ICD-10-CM | POA: Diagnosis not present

## 2019-04-04 DIAGNOSIS — C4912 Malignant neoplasm of connective and soft tissue of left upper limb, including shoulder: Secondary | ICD-10-CM | POA: Insufficient documentation

## 2019-04-04 DIAGNOSIS — I10 Essential (primary) hypertension: Secondary | ICD-10-CM | POA: Diagnosis not present

## 2019-04-04 DIAGNOSIS — G8918 Other acute postprocedural pain: Secondary | ICD-10-CM | POA: Diagnosis not present

## 2019-04-04 DIAGNOSIS — J449 Chronic obstructive pulmonary disease, unspecified: Secondary | ICD-10-CM | POA: Diagnosis not present

## 2019-04-04 HISTORY — PX: AMPUTATION: SHX166

## 2019-04-04 HISTORY — DX: Unspecified macular degeneration: H35.30

## 2019-04-04 HISTORY — DX: Presence of spectacles and contact lenses: Z97.3

## 2019-04-04 HISTORY — DX: Dependence on supplemental oxygen: Z99.81

## 2019-04-04 HISTORY — DX: Presence of dental prosthetic device (complete) (partial): Z97.2

## 2019-04-04 LAB — BASIC METABOLIC PANEL
Anion gap: 12 (ref 5–15)
BUN: 5 mg/dL — ABNORMAL LOW (ref 8–23)
CO2: 25 mmol/L (ref 22–32)
Calcium: 9.5 mg/dL (ref 8.9–10.3)
Chloride: 103 mmol/L (ref 98–111)
Creatinine, Ser: 0.61 mg/dL (ref 0.44–1.00)
GFR calc Af Amer: >60 mL/min (ref >60)
GFR calc non Af Amer: >60 mL/min (ref >60)
Glucose, Bld: 101 mg/dL — ABNORMAL HIGH (ref 70–99)
Potassium: 3.3 mmol/L — ABNORMAL LOW (ref 3.5–5.1)
Sodium: 140 mmol/L (ref 135–145)

## 2019-04-04 SURGERY — AMPUTATION DIGIT
Anesthesia: General | Site: Thumb | Laterality: Left

## 2019-04-04 MED ORDER — PROPOFOL 500 MG/50ML IV EMUL
INTRAVENOUS | Status: DC | PRN
  Administered 2019-04-04: 75 ug/kg/min via INTRAVENOUS

## 2019-04-04 MED ORDER — 0.9 % SODIUM CHLORIDE (POUR BTL) OPTIME
TOPICAL | Status: DC | PRN
  Administered 2019-04-04: 1000 mL

## 2019-04-04 MED ORDER — PROPOFOL 10 MG/ML IV BOLUS
INTRAVENOUS | Status: AC
  Filled 2019-04-04: qty 20

## 2019-04-04 MED ORDER — SODIUM CHLORIDE 0.9 % IV SOLN
INTRAVENOUS | Status: DC | PRN
  Administered 2019-04-04: 30 ug/min via INTRAVENOUS

## 2019-04-04 MED ORDER — BUPIVACAINE HCL (PF) 0.25 % IJ SOLN
INTRAMUSCULAR | Status: AC
  Filled 2019-04-04: qty 30

## 2019-04-04 MED ORDER — FENTANYL CITRATE (PF) 100 MCG/2ML IJ SOLN
50.0000 ug | Freq: Once | INTRAMUSCULAR | Status: AC
  Administered 2019-04-04: 50 ug via INTRAVENOUS

## 2019-04-04 MED ORDER — ROPIVACAINE HCL 5 MG/ML IJ SOLN
INTRAMUSCULAR | Status: DC | PRN
  Administered 2019-04-04: 20 mL via PERINEURAL

## 2019-04-04 MED ORDER — MIDAZOLAM HCL 2 MG/2ML IJ SOLN
1.0000 mg | Freq: Once | INTRAMUSCULAR | Status: AC
  Administered 2019-04-04: 1 mg via INTRAVENOUS

## 2019-04-04 MED ORDER — STERILE WATER FOR IRRIGATION IR SOLN
Status: DC | PRN
  Administered 2019-04-04: 1000 mL

## 2019-04-04 MED ORDER — HYDROCODONE-ACETAMINOPHEN 5-325 MG PO TABS: ORAL_TABLET | ORAL | 0 refills | Status: DC

## 2019-04-04 MED ORDER — FENTANYL CITRATE (PF) 250 MCG/5ML IJ SOLN
INTRAMUSCULAR | Status: AC
  Filled 2019-04-04: qty 5

## 2019-04-04 MED ORDER — MIDAZOLAM HCL 2 MG/2ML IJ SOLN
INTRAMUSCULAR | Status: AC
  Administered 2019-04-04: 09:00:00 1 mg via INTRAVENOUS
  Filled 2019-04-04: qty 2

## 2019-04-04 MED ORDER — CEFAZOLIN SODIUM-DEXTROSE 2-4 GM/100ML-% IV SOLN
2.0000 g | INTRAVENOUS | Status: AC
  Administered 2019-04-04: 2 g via INTRAVENOUS
  Filled 2019-04-04: qty 100

## 2019-04-04 MED ORDER — FENTANYL CITRATE (PF) 100 MCG/2ML IJ SOLN
INTRAMUSCULAR | Status: AC
  Administered 2019-04-04: 09:00:00 50 ug via INTRAVENOUS
  Filled 2019-04-04: qty 2

## 2019-04-04 MED ORDER — LACTATED RINGERS IV SOLN
INTRAVENOUS | Status: DC
  Administered 2019-04-04 (×2): via INTRAVENOUS

## 2019-04-04 MED ORDER — PHENYLEPHRINE 40 MCG/ML (10ML) SYRINGE FOR IV PUSH (FOR BLOOD PRESSURE SUPPORT)
PREFILLED_SYRINGE | INTRAVENOUS | Status: DC | PRN
  Administered 2019-04-04 (×2): 120 ug via INTRAVENOUS
  Administered 2019-04-04: 80 ug via INTRAVENOUS

## 2019-04-04 MED ORDER — PHENYLEPHRINE 40 MCG/ML (10ML) SYRINGE FOR IV PUSH (FOR BLOOD PRESSURE SUPPORT)
PREFILLED_SYRINGE | INTRAVENOUS | Status: AC
  Filled 2019-04-04: qty 10

## 2019-04-04 MED ORDER — LIDOCAINE 2% (20 MG/ML) 5 ML SYRINGE
INTRAMUSCULAR | Status: AC
  Filled 2019-04-04: qty 5

## 2019-04-04 MED ORDER — CHLORHEXIDINE GLUCONATE 4 % EX LIQD: 60.0000 mL | Freq: Once | CUTANEOUS | Status: DC

## 2019-04-04 MED ORDER — ONDANSETRON HCL 4 MG/2ML IJ SOLN
INTRAMUSCULAR | Status: AC
  Filled 2019-04-04: qty 2

## 2019-04-04 MED ORDER — ONDANSETRON HCL 4 MG/2ML IJ SOLN
INTRAMUSCULAR | Status: DC | PRN
  Administered 2019-04-04: 4 mg via INTRAVENOUS

## 2019-04-04 MED ORDER — FENTANYL CITRATE (PF) 250 MCG/5ML IJ SOLN
INTRAMUSCULAR | Status: DC | PRN
  Administered 2019-04-04: 25 ug via INTRAVENOUS

## 2019-04-04 SURGICAL SUPPLY — 43 items
BLADE LONG MED 31MMX9MM (MISCELLANEOUS)
BLADE LONG MED 31X9 (MISCELLANEOUS) IMPLANT
BNDG COHESIVE 1X5 TAN STRL LF (GAUZE/BANDAGES/DRESSINGS) ×3 IMPLANT
BNDG COHESIVE 2X5 TAN STRL LF (GAUZE/BANDAGES/DRESSINGS) ×3 IMPLANT
BNDG ESMARK 4X9 LF (GAUZE/BANDAGES/DRESSINGS) ×3 IMPLANT
BNDG GAUZE ELAST 4 BULKY (GAUZE/BANDAGES/DRESSINGS) IMPLANT
CORD BIPOLAR FORCEPS 12FT (ELECTRODE) ×3 IMPLANT
COVER SURGICAL LIGHT HANDLE (MISCELLANEOUS) ×3 IMPLANT
COVER WAND RF STERILE (DRAPES) ×3 IMPLANT
CUFF TOURN SGL QUICK 18X4 (TOURNIQUET CUFF) ×3 IMPLANT
CUFF TOURN SGL QUICK 24 (TOURNIQUET CUFF)
CUFF TRNQT CYL 24X4X16.5-23 (TOURNIQUET CUFF) IMPLANT
DRSG XEROFORM 1X8 (GAUZE/BANDAGES/DRESSINGS) ×3 IMPLANT
DURAPREP 26ML APPLICATOR (WOUND CARE) IMPLANT
GAUZE SPONGE 4X4 12PLY STRL (GAUZE/BANDAGES/DRESSINGS) IMPLANT
GAUZE SPONGE 4X4 12PLY STRL LF (GAUZE/BANDAGES/DRESSINGS) ×3 IMPLANT
GAUZE XEROFORM 1X8 LF (GAUZE/BANDAGES/DRESSINGS) IMPLANT
GLOVE BIO SURGEON STRL SZ7.5 (GLOVE) ×3 IMPLANT
GLOVE BIOGEL PI IND STRL 8 (GLOVE) ×1 IMPLANT
GLOVE BIOGEL PI INDICATOR 8 (GLOVE) ×2
GOWN STRL REUS W/ TWL LRG LVL3 (GOWN DISPOSABLE) ×1 IMPLANT
GOWN STRL REUS W/ TWL XL LVL3 (GOWN DISPOSABLE) ×1 IMPLANT
GOWN STRL REUS W/TWL LRG LVL3 (GOWN DISPOSABLE) ×2
GOWN STRL REUS W/TWL XL LVL3 (GOWN DISPOSABLE) ×2
KIT BASIN OR (CUSTOM PROCEDURE TRAY) ×3 IMPLANT
KIT TURNOVER KIT B (KITS) ×3 IMPLANT
NEEDLE HYPO 25GX1X1/2 BEV (NEEDLE) IMPLANT
NS IRRIG 1000ML POUR BTL (IV SOLUTION) ×3 IMPLANT
PACK ORTHO EXTREMITY (CUSTOM PROCEDURE TRAY) ×3 IMPLANT
PAD ARMBOARD 7.5X6 YLW CONV (MISCELLANEOUS) ×3 IMPLANT
PAD CAST 4YDX4 CTTN HI CHSV (CAST SUPPLIES) IMPLANT
PADDING CAST COTTON 4X4 STRL (CAST SUPPLIES)
SPECIMEN JAR SMALL (MISCELLANEOUS) IMPLANT
SPLINT FINGER (SOFTGOODS) ×3 IMPLANT
SUT CHROMIC 6 0 PS 4 (SUTURE) IMPLANT
SUT MON AB 5-0 P3 18 (SUTURE) ×3 IMPLANT
SUT MON AB 5-0 PS2 18 (SUTURE) IMPLANT
SUT VICRYL 4-0 PS2 18IN ABS (SUTURE) IMPLANT
SYR CONTROL 10ML LL (SYRINGE) IMPLANT
TOWEL GREEN STERILE (TOWEL DISPOSABLE) ×3 IMPLANT
TUBE CONNECTING 12'X1/4 (SUCTIONS) ×1
TUBE CONNECTING 12X1/4 (SUCTIONS) ×2 IMPLANT
UNDERPAD 30X30 (UNDERPADS AND DIAPERS) ×3 IMPLANT

## 2019-04-04 NOTE — Discharge Instructions (Addendum)

## 2019-04-04 NOTE — Anesthesia Procedure Notes (Signed)
Anesthesia Regional Block: Interscalene brachial plexus block   Pre-Anesthetic Checklist: ,, timeout performed, Correct Patient, Correct Site, Correct Laterality, Correct Procedure, Correct Position, site marked, Risks and benefits discussed,  Surgical consent,  Pre-op evaluation,  At surgeon's request and post-op pain management  Laterality: Left  Prep: chloraprep       Needles:  Injection technique: Single-shot  Needle Type: Echogenic Stimulator Needle     Needle Length: 9cm  Needle Gauge: 21     Additional Needles:   Procedures:,,,, ultrasound used (permanent image in chart),,,,  Narrative:  Start time: 04/04/2019 9:15 AM End time: 04/04/2019 9:21 AM Injection made incrementally with aspirations every 5 mL.  Performed by: Personally  Anesthesiologist: Lidia Collum, MD  Additional Notes: Monitors applied. Injection made in 5cc increments. No resistance to injection. Good needle visualization. Patient tolerated procedure well.

## 2019-04-04 NOTE — Anesthesia Postprocedure Evaluation (Signed)
Anesthesia Post Note  Patient: Debbie [name]  Procedure(s) Performed: AMPUTATION LEFT THUMB (Left Thumb)     Patient location during evaluation: PACU Anesthesia Type: Regional Level of consciousness: awake and alert and patient cooperative Pain management: pain level controlled Vital Signs Assessment: post-procedure vital signs reviewed and stable Respiratory status: spontaneous breathing and respiratory function stable Cardiovascular status: stable Anesthetic complications: no    Last Vitals:  Vitals:   04/04/19 1124 04/04/19 1125  BP: (!) 117/55 119/77  Pulse:  65  Resp:  (!) 22  Temp: 36.6 C 36.6 C  SpO2:  98%    Last Pain:  Vitals:   04/04/19 1125  TempSrc:   PainSc: 0-No pain                 Travarius Lange DAVID

## 2019-04-04 NOTE — Anesthesia Procedure Notes (Signed)
Procedure Name: MAC Date/Time: 04/04/2019 9:35 AM Performed by: Harden Mo, CRNA Pre-anesthesia Checklist: Patient identified, Emergency Drugs available, Suction available and Patient being monitored Patient Re-evaluated:Patient Re-evaluated prior to induction Oxygen Delivery Method: Simple face mask Preoxygenation: Pre-oxygenation with 100% oxygen Induction Type: IV induction Placement Confirmation: positive ETCO2 and breath sounds checked- equal and bilateral Dental Injury: Teeth and Oropharynx as per pre-operative assessment

## 2019-04-04 NOTE — H&P (Signed)
Debbie [name] is an 73 y.o. female.   Chief Complaint: left thumb squamous cell carcinoma HPI: 73 yo rhd female s/p excision of left thumb nail squamous cell carcinoma.  Deep margin positive.  She wishes to proceed with left thumb amputation for management of the carcinoma and remaining wound.  Allergies: No Known Allergies  Past Medical History:  Diagnosis Date  . Arthritis    hands  . Cancer (HCC)    chest- squamous thumb  . Complication of anesthesia 1997   Hard to awaken  . COPD (chronic obstructive pulmonary disease) ([place])   . Dyspnea   . Hypertension   . Macular degeneration    " early stage"  . Pneumonia     x 2  . Rheumatic fever   . Shingles   . Supplemental oxygen dependent   . Wears dentures    full upper  . Wears glasses     Past Surgical History:  Procedure Laterality Date  . CESAREAN SECTION    . CHOLECYSTECTOMY    . MULTIPLE TOOTH EXTRACTIONS      Family History: History reviewed. No pertinent family history.  Social History:   reports that she quit smoking about 28 years ago. Her smoking use included cigarettes. She quit after 25.00 years of use. She has never used smokeless tobacco. She reports that she does not drink alcohol or use drugs.  Medications: Medications Prior to Admission  Medication Sig Dispense Refill  . acetaminophen (TYLENOL) 500 MG tablet Take 500 mg by mouth every 6 (six) hours as needed for moderate pain or headache.    . albuterol (PROVENTIL HFA;VENTOLIN HFA) 108 (90 Base) MCG/ACT inhaler Inhale 1 puff into the lungs every 6 (six) hours as needed for wheezing or shortness of breath.    Marland Kitchen amLODipine (NORVASC) 5 MG tablet Take 5 mg by mouth at bedtime.  5  . docusate sodium (COLACE) 100 MG capsule Take 1 capsule (100 mg total) by mouth 2 (two) times daily. (Patient taking differently: Take 100 mg by mouth 2 (two) times daily as needed for mild constipation. ) 10 capsule 0  . ipratropium-albuterol (DUONEB) 0.5-2.5 (3) MG/3ML SOLN  Take 3 mLs by nebulization 2 (two) times a day.     . metoprolol succinate (TOPROL-XL) 100 MG 24 hr tablet Take 100 mg by mouth daily. Take with or immediately following a meal.    . Propylene Glycol (SYSTANE COMPLETE) 0.6 % SOLN Place 2 drops into both eyes daily as needed (for dry eyes).     . SYMBICORT 160-4.5 MCG/ACT inhaler Inhale 2 puffs into the lungs 2 (two) times daily.     . vitamin C (ASCORBIC ACID) 500 MG tablet Take 500 mg by mouth 2 (two) times daily.      Results for orders placed or performed during the hospital encounter of 04/04/19 (from the past 48 hour(s))  Basic metabolic panel     Status: Abnormal   Collection Time: 04/04/19  7:59 AM  Result Value Ref Range   Sodium 140 135 - 145 mmol/L   Potassium 3.3 (L) 3.5 - 5.1 mmol/L   Chloride 103 98 - 111 mmol/L   CO2 25 22 - 32 mmol/L   Glucose, Bld 101 (H) 70 - 99 mg/dL   BUN 5 (L) 8 - 23 mg/dL   Creatinine, Ser 0.61 0.44 - 1.00 mg/dL   Calcium 9.5 8.9 - 10.3 mg/dL   GFR calc non Af Amer >60 >60 mL/min   GFR calc Af  Amer >60 >60 mL/min   Anion gap 12 5 - 15    Comment: Performed at [facility] [address], [place], [place] [address]    No results found.   A comprehensive review of systems was negative except for: Constitutional: positive for sweats Respiratory: positive for shortness of breath  Blood pressure 134/62, pulse 85, temperature 98.4 F (36.9 C), temperature source Oral, resp. rate 18, height 5' 1.5" (1.562 m), weight 45.8 kg, SpO2 96 %.  General appearance: alert, cooperative and appears stated age Head: Normocephalic, without obvious abnormality, atraumatic Neck: supple, symmetrical, trachea midline Cardio: regular rate and rhythm Resp: clear to auscultation bilaterally Extremities: Intact sensation and capillary refill all digits.  +epl/fpl/io.  Left thumb with excised nail bed and remaining wound.   Pulses: 2+ and symmetric Skin: Skin color, texture, turgor normal. No rashes or  lesions Neurologic: Grossly normal Incision/Wound: as above  Assessment/Plan Left thumb squamous cell carcinoma and residual wound post excision with positive deep margin.  Plan left thumb amputation at IP joint.  Non operative and operative treatment options have been discussed with the patient and patient wishes to proceed with operative treatment. Risks, benefits, and alternatives of surgery have been discussed and the patient agrees with the plan of care.   Leanora Cover 04/04/2019, 9:30 AM

## 2019-04-04 NOTE — Op Note (Signed)
NAMEKENITRA [name] MEDICAL RECORD NO: [id number] DATE OF BIRTH: Jan 05, 1946 FACILITY: [facility] LOCATION: [facility] OR PHYSICIAN: Tennis Must, MD   OPERATIVE REPORT   DATE OF PROCEDURE: 04/04/19    PREOPERATIVE DIAGNOSIS:   Left thumb nailbed squamous cell carcinoma   POSTOPERATIVE DIAGNOSIS:   Left thumb nailbed squamous cell carcinoma   PROCEDURE:   Left thumb amputation through IP joint   SURGEON:  Leanora Cover, M.D.   ASSISTANT: none   ANESTHESIA:  Regional with sedation   INTRAVENOUS FLUIDS:  Per anesthesia flow sheet.   ESTIMATED BLOOD LOSS:  Minimal.   COMPLICATIONS:  None.   SPECIMENS:   Left thumb to pathology   TOURNIQUET TIME:    Total Tourniquet Time Documented: Upper Arm (Left) - 26 minutes Total: Upper Arm (Left) - 26 minutes    DISPOSITION:  Stable to PACU.   INDICATIONS: 73 year old female status post excision of left thumb nailbed squamous cell carcinoma.  Deep margins were positive.  I recommended amputation of the thumb through the IP joint. Risks, benefits and alternatives of surgery were discussed including the risks of blood loss, infection, damage to nerves, vessels, tendons, ligaments, bone for surgery, need for additional surgery, complications with wound healing, continued pain, stiffness.  She voiced understanding of these risks and elected to proceed.  OPERATIVE COURSE:  After being identified preoperatively by myself,  the patient and I agreed on the procedure and site of the procedure.  The surgical site was marked.  Surgical consent had been signed. She was given IV antibiotics as preoperative antibiotic prophylaxis. She was transferred to the operating room and placed on the operating table in supine position with the Left upper extremity on an arm board.  Sedation was induced by the anesthesiologist. A regional block had been performed by anesthesia in preoperative holding.   Left upper extremity was prepped and draped in normal sterile orthopedic  fashion.  A surgical pause was performed between the surgeons, anesthesia, and operating room staff and all were in agreement as to the patient, procedure, and site of procedure.  Tourniquet at the proximal aspect of the extremity was inflated to 250 mmHg after exsanguination of the arm with an Esmarch bandage.    Incision was made in a fishmouth type fashion.  Dorsally this was proximal to the germinal matrix.  Volarly it was approximately 7 mm distal to the IP flexion crease.  This was carried in subcutaneous tissues by careful spreading technique.  The radial and ulnar neurovascular bundles were identified.  The nerves were placed under traction bipolar and allowed to retract.  The arteries were bipolared.  Thumb was then amputated through the IP joint.  It was sent to pathology for examination.  The condyles of the proximal phalanx were taken down with a rondure to decrease the bulbousness distally.  The wound was then copiously irrigated with sterile saline.  It was closed with 5-0 Monocryl suture in interrupted fashion.  Good tension-free reapproximation of the skin edges was obtained.  The wound was dressed with sterile Xeroform 4 x 4 and wrapped with a Coban dressing lightly.  An AlumaFoam splint was placed and wrapped lightly with Coban dressing.  The tourniquet was deflated at 26 minutes.  Fingertips were pink with brisk capillary refill after deflation of tourniquet.  The operative  drapes were broken down.  The patient was awoken from anesthesia safely.  She was transferred back to the stretcher and taken to PACU in stable condition.  I will  see her back in the office in 1 week for postoperative followup.  I will give her a prescription for Norco 5/325 1-2 tabs PO q6 hours prn pain, dispense # 20.   Leanora Cover, MD Electronically signed, 04/04/19

## 2019-04-04 NOTE — Transfer of Care (Signed)
Immediate Anesthesia Transfer of Care Note  Patient: Debbie [name]  Procedure(s) Performed: AMPUTATION LEFT THUMB (Left Thumb)  Patient Location: PACU  Anesthesia Type:MAC and Regional  Level of Consciousness: awake and drowsy  Airway & Oxygen Therapy: Patient Spontanous Breathing and Patient connected to nasal cannula oxygen  Post-op Assessment: Report given to RN and Post -op Vital signs reviewed and stable  Post vital signs: Reviewed and stable  Last Vitals:  Vitals Value Taken Time  BP 103/55 04/04/19 1027  Temp    Pulse 65 04/04/19 1029  Resp 19 04/04/19 1029  SpO2 100 % 04/04/19 1029  Vitals shown include unvalidated device data.  Last Pain:  Vitals:   04/04/19 0920  TempSrc:   PainSc: 0-No pain         Complications: No apparent anesthesia complications

## 2019-04-05 ENCOUNTER — Encounter (HOSPITAL_COMMUNITY): Payer: Self-pay | Admitting: Orthopedic Surgery

## 2019-04-06 DIAGNOSIS — I2510 Atherosclerotic heart disease of native coronary artery without angina pectoris: Secondary | ICD-10-CM | POA: Diagnosis not present

## 2019-04-06 DIAGNOSIS — N281 Cyst of kidney, acquired: Secondary | ICD-10-CM | POA: Diagnosis not present

## 2019-04-06 DIAGNOSIS — S2231XD Fracture of one rib, right side, subsequent encounter for fracture with routine healing: Secondary | ICD-10-CM | POA: Diagnosis not present

## 2019-04-06 DIAGNOSIS — S2220XD Unspecified fracture of sternum, subsequent encounter for fracture with routine healing: Secondary | ICD-10-CM | POA: Diagnosis not present

## 2019-04-06 DIAGNOSIS — J441 Chronic obstructive pulmonary disease with (acute) exacerbation: Secondary | ICD-10-CM | POA: Diagnosis not present

## 2019-04-11 DIAGNOSIS — M79645 Pain in left finger(s): Secondary | ICD-10-CM | POA: Diagnosis not present

## 2019-04-11 DIAGNOSIS — C44629 Squamous cell carcinoma of skin of left upper limb, including shoulder: Secondary | ICD-10-CM | POA: Diagnosis not present

## 2019-04-13 DIAGNOSIS — J449 Chronic obstructive pulmonary disease, unspecified: Secondary | ICD-10-CM | POA: Diagnosis not present

## 2019-04-26 DIAGNOSIS — S2220XD Unspecified fracture of sternum, subsequent encounter for fracture with routine healing: Secondary | ICD-10-CM | POA: Diagnosis not present

## 2019-04-26 DIAGNOSIS — S2231XD Fracture of one rib, right side, subsequent encounter for fracture with routine healing: Secondary | ICD-10-CM | POA: Diagnosis not present

## 2019-04-26 DIAGNOSIS — N281 Cyst of kidney, acquired: Secondary | ICD-10-CM | POA: Diagnosis not present

## 2019-04-26 DIAGNOSIS — I2510 Atherosclerotic heart disease of native coronary artery without angina pectoris: Secondary | ICD-10-CM | POA: Diagnosis not present

## 2019-04-26 DIAGNOSIS — J441 Chronic obstructive pulmonary disease with (acute) exacerbation: Secondary | ICD-10-CM | POA: Diagnosis not present

## 2019-05-05 ENCOUNTER — Encounter (HOSPITAL_COMMUNITY): Payer: Self-pay

## 2019-05-05 DIAGNOSIS — I509 Heart failure, unspecified: Secondary | ICD-10-CM

## 2019-05-05 HISTORY — DX: Heart failure, unspecified: I50.9

## 2019-05-06 ENCOUNTER — Encounter (HOSPITAL_COMMUNITY): Payer: Self-pay | Admitting: Hematology

## 2019-05-06 ENCOUNTER — Other Ambulatory Visit: Payer: Self-pay

## 2019-05-06 ENCOUNTER — Inpatient Hospital Stay (HOSPITAL_COMMUNITY): Payer: Medicare Other

## 2019-05-06 ENCOUNTER — Inpatient Hospital Stay (HOSPITAL_COMMUNITY): Payer: Medicare Other | Attending: Hematology | Admitting: Hematology

## 2019-05-06 VITALS — BP 165/86 | HR 86 | Temp 98.6°F | Resp 22 | Wt 103.2 lb

## 2019-05-06 DIAGNOSIS — I10 Essential (primary) hypertension: Secondary | ICD-10-CM | POA: Insufficient documentation

## 2019-05-06 DIAGNOSIS — Z9981 Dependence on supplemental oxygen: Secondary | ICD-10-CM | POA: Insufficient documentation

## 2019-05-06 DIAGNOSIS — Z89012 Acquired absence of left thumb: Secondary | ICD-10-CM | POA: Diagnosis not present

## 2019-05-06 DIAGNOSIS — J449 Chronic obstructive pulmonary disease, unspecified: Secondary | ICD-10-CM | POA: Insufficient documentation

## 2019-05-06 DIAGNOSIS — C44609 Unspecified malignant neoplasm of skin of left upper limb, including shoulder: Secondary | ICD-10-CM | POA: Insufficient documentation

## 2019-05-06 DIAGNOSIS — Z79899 Other long term (current) drug therapy: Secondary | ICD-10-CM | POA: Insufficient documentation

## 2019-05-06 DIAGNOSIS — C44629 Squamous cell carcinoma of skin of left upper limb, including shoulder: Secondary | ICD-10-CM | POA: Diagnosis not present

## 2019-05-06 DIAGNOSIS — Z87891 Personal history of nicotine dependence: Secondary | ICD-10-CM | POA: Diagnosis not present

## 2019-05-06 DIAGNOSIS — Z801 Family history of malignant neoplasm of trachea, bronchus and lung: Secondary | ICD-10-CM | POA: Diagnosis not present

## 2019-05-06 LAB — LACTATE DEHYDROGENASE: LDH: 136 U/L (ref 98–192)
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
## 2019-05-06 NOTE — Patient Instructions (Signed)
[place] at [facility] Discharge Instructions  You were seen today by Dr. Delton Coombes. He went over your history, family history and how you've been feeling lately. He will get blood drawn today. He will also schedule you for a PET scan. He will see you back after your scan for follow up.   Thank you for choosing [place] at [facility] to provide your oncology and hematology care.  To afford each patient quality time with our provider, please arrive at least 15 minutes before your scheduled appointment time.   If you have a lab appointment with the [facility] please come in thru the  Main Entrance and check in at the main information desk  You need to re-schedule your appointment should you arrive 10 or more minutes late.  We strive to give you quality time with our providers, and arriving late affects you and other patients whose appointments are after yours.  Also, if you no show three or more times for appointments you may be dismissed from the clinic at the providers discretion.     Again, thank you for choosing [facility].  Our hope is that these requests will decrease the amount of time that you wait before being seen by our physicians.       _____________________________________________________________  Should you have questions after your visit to [facility], please contact our office at [phone] between the hours of 8:00 a.m. and 4:30 p.m.  Voicemails left after 4:00 p.m. will not be returned until the following business day.  For prescription refill requests, have your pharmacy contact our office and allow 72 hours.    Cancer Center Support Programs:   > Cancer Support Group  2nd Tuesday of the month 1pm-2pm, Journey Room

## 2019-05-09 ENCOUNTER — Encounter (HOSPITAL_COMMUNITY): Payer: Self-pay | Admitting: *Deleted

## 2019-05-09 NOTE — Progress Notes (Signed)
Per Dr. Tomie China request, original pathology report requested from Dr. Levell July office.

## 2019-05-14 DIAGNOSIS — J449 Chronic obstructive pulmonary disease, unspecified: Secondary | ICD-10-CM | POA: Diagnosis not present

## 2019-05-17 DIAGNOSIS — Z1329 Encounter for screening for other suspected endocrine disorder: Secondary | ICD-10-CM | POA: Diagnosis not present

## 2019-05-17 DIAGNOSIS — I10 Essential (primary) hypertension: Secondary | ICD-10-CM | POA: Diagnosis not present

## 2019-05-23 ENCOUNTER — Encounter (HOSPITAL_COMMUNITY)
Admission: RE | Admit: 2019-05-23 | Discharge: 2019-05-23 | Disposition: A | Discharge status: Home / Self Care | Payer: Medicare Other | Source: Ambulatory Visit | Attending: Hematology | Admitting: Hematology

## 2019-05-23 ENCOUNTER — Other Ambulatory Visit: Payer: Self-pay

## 2019-05-23 DIAGNOSIS — C44609 Unspecified malignant neoplasm of skin of left upper limb, including shoulder: Secondary | ICD-10-CM | POA: Insufficient documentation

## 2019-05-23 DIAGNOSIS — C44629 Squamous cell carcinoma of skin of left upper limb, including shoulder: Secondary | ICD-10-CM

## 2019-05-23 MED ORDER — FLUDEOXYGLUCOSE F - 18 (FDG) INJECTION
6.7100 | Freq: Once | INTRAVENOUS | Status: AC | PRN
  Administered 2019-05-23: 6.71 via INTRAVENOUS

## 2019-05-24 ENCOUNTER — Ambulatory Visit (HOSPITAL_COMMUNITY): Payer: Medicare Other | Admitting: Hematology

## 2019-05-24 DIAGNOSIS — C7989 Secondary malignant neoplasm of other specified sites: Secondary | ICD-10-CM | POA: Diagnosis not present

## 2019-05-24 DIAGNOSIS — R911 Solitary pulmonary nodule: Secondary | ICD-10-CM | POA: Diagnosis not present

## 2019-05-24 DIAGNOSIS — N281 Cyst of kidney, acquired: Secondary | ICD-10-CM | POA: Diagnosis not present

## 2019-05-24 DIAGNOSIS — C44609 Unspecified malignant neoplasm of skin of left upper limb, including shoulder: Secondary | ICD-10-CM | POA: Diagnosis not present

## 2019-05-24 DIAGNOSIS — I2510 Atherosclerotic heart disease of native coronary artery without angina pectoris: Secondary | ICD-10-CM | POA: Diagnosis not present

## 2019-05-24 DIAGNOSIS — J441 Chronic obstructive pulmonary disease with (acute) exacerbation: Secondary | ICD-10-CM | POA: Diagnosis not present

## 2019-06-02 ENCOUNTER — Other Ambulatory Visit: Payer: Self-pay

## 2019-06-02 ENCOUNTER — Encounter (HOSPITAL_COMMUNITY): Payer: Self-pay | Admitting: Hematology

## 2019-06-02 ENCOUNTER — Inpatient Hospital Stay (HOSPITAL_COMMUNITY): Payer: Medicare Other | Attending: Hematology | Admitting: Hematology

## 2019-06-02 VITALS — BP 138/65 | HR 77 | Temp 97.3°F | Resp 20 | Wt 104.4 lb

## 2019-06-02 DIAGNOSIS — Z87891 Personal history of nicotine dependence: Secondary | ICD-10-CM | POA: Insufficient documentation

## 2019-06-02 DIAGNOSIS — Z808 Family history of malignant neoplasm of other organs or systems: Secondary | ICD-10-CM | POA: Insufficient documentation

## 2019-06-02 DIAGNOSIS — Z801 Family history of malignant neoplasm of trachea, bronchus and lung: Secondary | ICD-10-CM | POA: Diagnosis not present

## 2019-06-02 DIAGNOSIS — Z23 Encounter for immunization: Secondary | ICD-10-CM | POA: Insufficient documentation

## 2019-06-02 DIAGNOSIS — Z8042 Family history of malignant neoplasm of prostate: Secondary | ICD-10-CM | POA: Insufficient documentation

## 2019-06-02 DIAGNOSIS — R918 Other nonspecific abnormal finding of lung field: Secondary | ICD-10-CM | POA: Diagnosis not present

## 2019-06-02 DIAGNOSIS — Z79899 Other long term (current) drug therapy: Secondary | ICD-10-CM | POA: Diagnosis not present

## 2019-06-02 DIAGNOSIS — Z89012 Acquired absence of left thumb: Secondary | ICD-10-CM | POA: Insufficient documentation

## 2019-06-02 DIAGNOSIS — I10 Essential (primary) hypertension: Secondary | ICD-10-CM | POA: Insufficient documentation

## 2019-06-02 DIAGNOSIS — C44629 Squamous cell carcinoma of skin of left upper limb, including shoulder: Secondary | ICD-10-CM | POA: Insufficient documentation

## 2019-06-02 DIAGNOSIS — C44609 Unspecified malignant neoplasm of skin of left upper limb, including shoulder: Secondary | ICD-10-CM | POA: Diagnosis not present

## 2019-06-02 DIAGNOSIS — N281 Cyst of kidney, acquired: Secondary | ICD-10-CM

## 2019-06-02 MED ORDER — INFLUENZA VAC A&B SA ADJ QUAD 0.5 ML IM PRSY
0.5000 mL | PREFILLED_SYRINGE | Freq: Once | INTRAMUSCULAR | Status: AC
  Administered 2019-06-02: 12:00:00 0.5 mL via INTRAMUSCULAR
  Filled 2019-06-02: qty 0.5
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
## 2019-06-02 NOTE — Patient Instructions (Signed)
[name] at [facility] Discharge Instructions  You were seen today by Dr. Delton Coombes. He went over your recent lab and scan results. He will repeat an ultrasound of your right kidney. He will see you back in 6 months for labs and follow up.   Thank you for choosing [place] at [facility] to provide your oncology and hematology care.  To afford each patient quality time with our provider, please arrive at least 15 minutes before your scheduled appointment time.   If you have a lab appointment with the [facility] please come in thru the  Main Entrance and check in at the main information desk  You need to re-schedule your appointment should you arrive 10 or more minutes late.  We strive to give you quality time with our providers, and arriving late affects you and other patients whose appointments are after yours.  Also, if you no show three or more times for appointments you may be dismissed from the clinic at the providers discretion.     Again, thank you for choosing [facility].  Our hope is that these requests will decrease the amount of time that you wait before being seen by our physicians.       _____________________________________________________________  Should you have questions after your visit to [facility], please contact our office at [phone] between the hours of 8:00 a.m. and 4:30 p.m.  Voicemails left after 4:00 p.m. will not be returned until the following business day.  For prescription refill requests, have your pharmacy contact our office and allow 72 hours.    Cancer Center Support Programs:   > Cancer Support Group  2nd Tuesday of the month 1pm-2pm, Journey Room

## 2019-06-07 DIAGNOSIS — J449 Chronic obstructive pulmonary disease, unspecified: Secondary | ICD-10-CM | POA: Diagnosis not present

## 2019-06-07 DIAGNOSIS — I2510 Atherosclerotic heart disease of native coronary artery without angina pectoris: Secondary | ICD-10-CM | POA: Diagnosis not present

## 2019-06-07 DIAGNOSIS — I509 Heart failure, unspecified: Secondary | ICD-10-CM | POA: Diagnosis not present

## 2019-06-07 DIAGNOSIS — J9611 Chronic respiratory failure with hypoxia: Secondary | ICD-10-CM | POA: Diagnosis not present

## 2019-06-13 DIAGNOSIS — J449 Chronic obstructive pulmonary disease, unspecified: Secondary | ICD-10-CM | POA: Diagnosis not present

## 2019-06-15 DIAGNOSIS — I2510 Atherosclerotic heart disease of native coronary artery without angina pectoris: Secondary | ICD-10-CM | POA: Diagnosis not present

## 2019-06-15 DIAGNOSIS — J441 Chronic obstructive pulmonary disease with (acute) exacerbation: Secondary | ICD-10-CM | POA: Diagnosis not present

## 2019-06-15 DIAGNOSIS — R911 Solitary pulmonary nodule: Secondary | ICD-10-CM | POA: Diagnosis not present

## 2019-06-15 DIAGNOSIS — C44609 Unspecified malignant neoplasm of skin of left upper limb, including shoulder: Secondary | ICD-10-CM | POA: Diagnosis not present

## 2019-06-15 DIAGNOSIS — N281 Cyst of kidney, acquired: Secondary | ICD-10-CM | POA: Diagnosis not present

## 2019-07-14 DIAGNOSIS — J449 Chronic obstructive pulmonary disease, unspecified: Secondary | ICD-10-CM | POA: Diagnosis not present

## 2019-08-13 DIAGNOSIS — J449 Chronic obstructive pulmonary disease, unspecified: Secondary | ICD-10-CM | POA: Diagnosis not present

## 2019-08-23 DIAGNOSIS — H35033 Hypertensive retinopathy, bilateral: Secondary | ICD-10-CM | POA: Diagnosis not present

## 2019-08-23 DIAGNOSIS — H524 Presbyopia: Secondary | ICD-10-CM | POA: Diagnosis not present

## 2019-08-23 DIAGNOSIS — H2589 Other age-related cataract: Secondary | ICD-10-CM | POA: Diagnosis not present

## 2019-09-13 DIAGNOSIS — J449 Chronic obstructive pulmonary disease, unspecified: Secondary | ICD-10-CM | POA: Diagnosis not present

## 2019-09-15 DIAGNOSIS — I2510 Atherosclerotic heart disease of native coronary artery without angina pectoris: Secondary | ICD-10-CM | POA: Diagnosis not present

## 2019-09-15 DIAGNOSIS — J441 Chronic obstructive pulmonary disease with (acute) exacerbation: Secondary | ICD-10-CM | POA: Diagnosis not present

## 2019-09-15 DIAGNOSIS — N281 Cyst of kidney, acquired: Secondary | ICD-10-CM | POA: Diagnosis not present

## 2019-10-12 DIAGNOSIS — N281 Cyst of kidney, acquired: Secondary | ICD-10-CM | POA: Diagnosis not present

## 2019-10-12 DIAGNOSIS — I2510 Atherosclerotic heart disease of native coronary artery without angina pectoris: Secondary | ICD-10-CM | POA: Diagnosis not present

## 2019-10-12 DIAGNOSIS — J441 Chronic obstructive pulmonary disease with (acute) exacerbation: Secondary | ICD-10-CM | POA: Diagnosis not present

## 2019-10-14 DIAGNOSIS — J449 Chronic obstructive pulmonary disease, unspecified: Secondary | ICD-10-CM | POA: Diagnosis not present

## 2019-11-11 DIAGNOSIS — N281 Cyst of kidney, acquired: Secondary | ICD-10-CM | POA: Diagnosis not present

## 2019-11-11 DIAGNOSIS — I2510 Atherosclerotic heart disease of native coronary artery without angina pectoris: Secondary | ICD-10-CM | POA: Diagnosis not present

## 2019-11-11 DIAGNOSIS — J441 Chronic obstructive pulmonary disease with (acute) exacerbation: Secondary | ICD-10-CM | POA: Diagnosis not present

## 2019-11-11 DIAGNOSIS — J449 Chronic obstructive pulmonary disease, unspecified: Secondary | ICD-10-CM | POA: Diagnosis not present

## 2019-11-18 DIAGNOSIS — E43 Unspecified severe protein-calorie malnutrition: Secondary | ICD-10-CM | POA: Diagnosis not present

## 2019-11-18 DIAGNOSIS — C44609 Unspecified malignant neoplasm of skin of left upper limb, including shoulder: Secondary | ICD-10-CM | POA: Diagnosis not present

## 2019-11-18 DIAGNOSIS — C4362 Malignant melanoma of left upper limb, including shoulder: Secondary | ICD-10-CM | POA: Diagnosis not present

## 2019-11-18 DIAGNOSIS — C44621 Squamous cell carcinoma of skin of unspecified upper limb, including shoulder: Secondary | ICD-10-CM | POA: Diagnosis not present

## 2019-11-18 DIAGNOSIS — I10 Essential (primary) hypertension: Secondary | ICD-10-CM | POA: Diagnosis not present

## 2019-11-22 DIAGNOSIS — I10 Essential (primary) hypertension: Secondary | ICD-10-CM | POA: Diagnosis not present

## 2019-11-22 DIAGNOSIS — F5101 Primary insomnia: Secondary | ICD-10-CM | POA: Diagnosis not present

## 2019-11-22 DIAGNOSIS — R911 Solitary pulmonary nodule: Secondary | ICD-10-CM | POA: Diagnosis not present

## 2019-11-22 DIAGNOSIS — C44609 Unspecified malignant neoplasm of skin of left upper limb, including shoulder: Secondary | ICD-10-CM | POA: Diagnosis not present

## 2019-11-22 DIAGNOSIS — E1165 Type 2 diabetes mellitus with hyperglycemia: Secondary | ICD-10-CM | POA: Diagnosis not present

## 2019-11-22 DIAGNOSIS — R079 Chest pain, unspecified: Secondary | ICD-10-CM | POA: Diagnosis not present

## 2019-11-30 ENCOUNTER — Other Ambulatory Visit (HOSPITAL_COMMUNITY): Payer: Self-pay | Admitting: *Deleted

## 2019-11-30 DIAGNOSIS — R911 Solitary pulmonary nodule: Secondary | ICD-10-CM | POA: Diagnosis not present

## 2019-11-30 DIAGNOSIS — N281 Cyst of kidney, acquired: Secondary | ICD-10-CM | POA: Diagnosis not present

## 2019-11-30 DIAGNOSIS — I2510 Atherosclerotic heart disease of native coronary artery without angina pectoris: Secondary | ICD-10-CM | POA: Diagnosis not present

## 2019-11-30 DIAGNOSIS — C44609 Unspecified malignant neoplasm of skin of left upper limb, including shoulder: Secondary | ICD-10-CM

## 2019-11-30 DIAGNOSIS — J441 Chronic obstructive pulmonary disease with (acute) exacerbation: Secondary | ICD-10-CM | POA: Diagnosis not present

## 2019-12-01 ENCOUNTER — Other Ambulatory Visit: Payer: Self-pay

## 2019-12-01 ENCOUNTER — Ambulatory Visit (HOSPITAL_COMMUNITY)
Admission: RE | Admit: 2019-12-01 | Discharge: 2019-12-01 | Disposition: A | Discharge status: Home / Self Care | Payer: Medicare Other | Source: Ambulatory Visit | Attending: Hematology | Admitting: Hematology

## 2019-12-01 ENCOUNTER — Inpatient Hospital Stay (HOSPITAL_COMMUNITY): Payer: Medicare Other | Attending: Hematology

## 2019-12-01 DIAGNOSIS — Z87891 Personal history of nicotine dependence: Secondary | ICD-10-CM | POA: Diagnosis not present

## 2019-12-01 DIAGNOSIS — Z85828 Personal history of other malignant neoplasm of skin: Secondary | ICD-10-CM | POA: Diagnosis not present

## 2019-12-01 DIAGNOSIS — C44609 Unspecified malignant neoplasm of skin of left upper limb, including shoulder: Secondary | ICD-10-CM

## 2019-12-01 DIAGNOSIS — N281 Cyst of kidney, acquired: Secondary | ICD-10-CM | POA: Diagnosis not present

## 2019-12-01 DIAGNOSIS — N200 Calculus of kidney: Secondary | ICD-10-CM | POA: Diagnosis not present

## 2019-12-01 DIAGNOSIS — Z7951 Long term (current) use of inhaled steroids: Secondary | ICD-10-CM | POA: Insufficient documentation

## 2019-12-01 DIAGNOSIS — R918 Other nonspecific abnormal finding of lung field: Secondary | ICD-10-CM | POA: Insufficient documentation

## 2019-12-01 DIAGNOSIS — Z79899 Other long term (current) drug therapy: Secondary | ICD-10-CM | POA: Diagnosis not present

## 2019-12-01 DIAGNOSIS — Z89012 Acquired absence of left thumb: Secondary | ICD-10-CM | POA: Diagnosis not present

## 2019-12-01 DIAGNOSIS — J449 Chronic obstructive pulmonary disease, unspecified: Secondary | ICD-10-CM | POA: Diagnosis not present

## 2019-12-01 LAB — COMPREHENSIVE METABOLIC PANEL
ALT: 14 U/L (ref 0–44)
AST: 17 U/L (ref 15–41)
Albumin: 4.3 g/dL (ref 3.5–5.0)
Alkaline Phosphatase: 74 U/L (ref 38–126)
Anion gap: 11 (ref 5–15)
BUN: 9 mg/dL (ref 8–23)
CO2: 25 mmol/L (ref 22–32)
Calcium: 9.4 mg/dL (ref 8.9–10.3)
Chloride: 101 mmol/L (ref 98–111)
Creatinine, Ser: 0.51 mg/dL (ref 0.44–1.00)
GFR calc Af Amer: >60 mL/min (ref >60)
GFR calc non Af Amer: >60 mL/min (ref >60)
Glucose, Bld: 104 mg/dL — ABNORMAL HIGH (ref 70–99)
Potassium: 3.4 mmol/L — ABNORMAL LOW (ref 3.5–5.1)
Sodium: 137 mmol/L (ref 135–145)
Total Bilirubin: 0.8 mg/dL (ref 0.3–1.2)
Total Protein: 7.3 g/dL (ref 6.5–8.1)

## 2019-12-01 LAB — URINALYSIS, ROUTINE W REFLEX MICROSCOPIC
Bilirubin Urine: NEGATIVE
Glucose, UA: NEGATIVE mg/dL
Hgb urine dipstick: NEGATIVE
Ketones, ur: NEGATIVE mg/dL
Leukocytes,Ua: NEGATIVE
Nitrite: NEGATIVE
Protein, ur: NEGATIVE mg/dL
Specific Gravity, Urine: 1.006 (ref 1.005–1.030)
pH: 5 (ref 5.0–8.0)

## 2019-12-01 LAB — CBC WITH DIFFERENTIAL/PLATELET
Abs Immature Granulocytes: 0.02 10*3/uL (ref 0.00–0.07)
Basophils Absolute: 0.1 10*3/uL (ref 0.0–0.1)
Basophils Relative: 1 %
Eosinophils Absolute: 0.1 10*3/uL (ref 0.0–0.5)
Eosinophils Relative: 1 %
HCT: 42.8 % (ref 36.0–46.0)
Hemoglobin: 13.9 g/dL (ref 12.0–15.0)
Immature Granulocytes: 0 %
Lymphocytes Relative: 36 %
Lymphs Abs: 2.8 10*3/uL (ref 0.7–4.0)
MCH: 30.8 pg (ref 26.0–34.0)
MCHC: 32.5 g/dL (ref 30.0–36.0)
MCV: 94.9 fL (ref 80.0–100.0)
Monocytes Absolute: 0.5 10*3/uL (ref 0.1–1.0)
Monocytes Relative: 7 %
Neutro Abs: 4.3 10*3/uL (ref 1.7–7.7)
Neutrophils Relative %: 55 %
Platelets: 226 10*3/uL (ref 150–400)
RBC: 4.51 MIL/uL (ref 3.87–5.11)
RDW: 13.4 % (ref 11.5–15.5)
WBC: 7.8 10*3/uL (ref 4.0–10.5)
nRBC: 0 % (ref 0.0–0.2)

## 2019-12-05 ENCOUNTER — Encounter (HOSPITAL_COMMUNITY): Payer: Self-pay | Admitting: Hematology

## 2019-12-05 ENCOUNTER — Inpatient Hospital Stay (HOSPITAL_COMMUNITY): Payer: Medicare Other | Admitting: Hematology

## 2019-12-05 ENCOUNTER — Other Ambulatory Visit: Payer: Self-pay

## 2019-12-05 VITALS — BP 115/62 | HR 77 | Temp 97.3°F | Resp 18 | Wt 111.7 lb

## 2019-12-05 DIAGNOSIS — Z89012 Acquired absence of left thumb: Secondary | ICD-10-CM | POA: Diagnosis not present

## 2019-12-05 DIAGNOSIS — Z87891 Personal history of nicotine dependence: Secondary | ICD-10-CM | POA: Diagnosis not present

## 2019-12-05 DIAGNOSIS — N281 Cyst of kidney, acquired: Secondary | ICD-10-CM | POA: Diagnosis not present

## 2019-12-05 DIAGNOSIS — J449 Chronic obstructive pulmonary disease, unspecified: Secondary | ICD-10-CM | POA: Diagnosis not present

## 2019-12-05 DIAGNOSIS — Z79899 Other long term (current) drug therapy: Secondary | ICD-10-CM | POA: Diagnosis not present

## 2019-12-05 DIAGNOSIS — C44609 Unspecified malignant neoplasm of skin of left upper limb, including shoulder: Secondary | ICD-10-CM | POA: Diagnosis not present

## 2019-12-05 DIAGNOSIS — Z7951 Long term (current) use of inhaled steroids: Secondary | ICD-10-CM | POA: Diagnosis not present

## 2019-12-05 DIAGNOSIS — R918 Other nonspecific abnormal finding of lung field: Secondary | ICD-10-CM | POA: Diagnosis not present

## 2019-12-05 DIAGNOSIS — Z85828 Personal history of other malignant neoplasm of skin: Secondary | ICD-10-CM | POA: Diagnosis not present

## 2019-12-05 NOTE — Assessment & Plan Note (Addendum)
1.  Left thumb nailbed squamous cell carcinoma: -Lesion at the tip of the left thumb, waxing and waning over 10 years. -Excision of the left thumb nailbed consistent with squamous cell carcinoma, positive margins.  Left thumb amputation through IP joint on 04/04/2019 by Dr. Fredna Dow. -Pathology showed no residual squamous cell carcinoma, margins are free. 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  No other malignancies reported. -PET scan on 05/24/2019 showed no evidence of metastatic disease.  9 mm right lower lobe pulmonary nodule with no FDG uptake.  4.3 cm complex cystic lesion in the right kidney cannot be characterized.  No hypermetabolic activity associated with it. -Physical exam today did not reveal any evidence of recurrence.  Labs reviewed by me were normal. -Labs from 11/18/2019 from Dr. Juel Burrow office showed slightly elevated absolute lymphocyte count.  This was normal on the labs from 12/01/2019. -I will see her back in 6 months for follow-up.  2.  Right kidney cyst: -UA was negative for hematuria. -I reviewed results of ultrasound of the kidneys on 12/01/2019 shows exophytic cluster of cysts or septated single cyst in the interpolar region of the right kidney measuring 4.3 x 2.5 x 3.5 cm.  No hydronephrosis. -We will likely follow-up on it in a year.

## 2019-12-05 NOTE — Patient Instructions (Signed)
[place] at [facility] Discharge Instructions  You were seen today by Dr. Delton Coombes. He went over your recent test results. He will see you back in 6 months for labs and follow up.   Thank you for choosing [place] at [facility] to provide your oncology and hematology care.  To afford each patient quality time with our provider, please arrive at least 15 minutes before your scheduled appointment time.   If you have a lab appointment with the [facility] please come in thru the  Main Entrance and check in at the main information desk  You need to re-schedule your appointment should you arrive 10 or more minutes late.  We strive to give you quality time with our providers, and arriving late affects you and other patients whose appointments are after yours.  Also, if you no show three or more times for appointments you may be dismissed from the clinic at the providers discretion.     Again, thank you for choosing [facility].  Our hope is that these requests will decrease the amount of time that you wait before being seen by our physicians.       _____________________________________________________________  Should you have questions after your visit to [facility], please contact our office at [phone] between the hours of 8:00 a.m. and 4:30 p.m.  Voicemails left after 4:00 p.m. will not be returned until the following business day.  For prescription refill requests, have your pharmacy contact our office and allow 72 hours.    Cancer Center Support Programs:   > Cancer Support Group  2nd Tuesday of the month 1pm-2pm, Journey Room

## 2019-12-05 NOTE — Progress Notes (Signed)
[place] [address], [place] [address]   CLINIC:  Medical Oncology/Hematology  PCP:  Celene Squibb, MD [place] [address] [phone]   REASON FOR VISIT:  Follow-up for squamous cell cancer.  CURRENT THERAPY: Observation.    INTERVAL HISTORY:  Debbie [name] 74 y.o. female seen for follow-up of nailbed squamous cell carcinoma and a right kidney cyst.  Denies any hematuria.  Denies any fevers, night sweats or weight loss in the last 6 months.  Appetite is 100%.  Energy levels are 75%.  Chronic cough and shortness of breath from COPD is stable.  She is oxygen dependent.Denies any new onset pains.    REVIEW OF SYSTEMS:  Review of Systems  Respiratory: Positive for cough and shortness of breath.   Psychiatric/Behavioral: Positive for sleep disturbance.  All other systems reviewed and are negative.    PAST MEDICAL/SURGICAL HISTORY:  Past Medical History:  Diagnosis Date  . Arthritis    hands  . Cancer (HCC)    chest- squamous thumb  . Complication of anesthesia 1997   Hard to awaken  . COPD (chronic obstructive pulmonary disease) ([place])   . Dyspnea   . Heart failure ([place]) 05/05/2019  . Hypertension   . Macular degeneration    " early stage"  . Pneumonia     x 2  . Rheumatic fever   . Shingles   . Supplemental oxygen dependent   . Wears dentures    full upper  . Wears glasses    Past Surgical History:  Procedure Laterality Date  . AMPUTATION Left 04/04/2019   Procedure: AMPUTATION LEFT THUMB;  Surgeon: Leanora Cover, MD;  Location: [facility];  Service: Orthopedics;  Laterality: Left;  . CESAREAN SECTION    . CHOLECYSTECTOMY    . MULTIPLE TOOTH EXTRACTIONS       SOCIAL HISTORY:  Social History   Socioeconomic History  . Marital status: Divorced    Spouse name: Not on file  . Number of children: 5  . Years of education: Not on file  . Highest education level: Not on file  Occupational History  . Not on file    Tobacco Use  . Smoking status: Former Smoker    Years: 25.00    Types: Cigarettes    Quit date: 1992    Years since quitting: 29.2  . Smokeless tobacco: Never Used  Substance and Sexual Activity  . Alcohol use: No  . Drug use: No  . Sexual activity: Not on file  Other Topics Concern  . Not on file  Social History Narrative  . Not on file   Social Determinants of Health   Financial Resource Strain: Medium Risk  . Difficulty of Paying Living Expenses: Somewhat hard  Food Insecurity: No Food Insecurity  . Worried About Charity fundraiser in the Last Year: Never true  . Ran Out of Food in the Last Year: Never true  Transportation Needs: No Transportation Needs  . Lack of Transportation (Medical): No  . Lack of Transportation (Non-Medical): No  Physical Activity: Inactive  . Days of Exercise per Week: 0 days  . Minutes of Exercise per Session: 0 min  Stress: No Stress Concern Present  . Feeling of Stress : Not at all  Social Connections: Somewhat Isolated  . Frequency of Communication with Friends and Family: More than three times a week  . Frequency of Social Gatherings with Friends and Family: Never  . Attends  Religious Services: More than 4 times per year  . Active Member of Clubs or Organizations: No  . Attends Archivist Meetings: Never  . Marital Status: Divorced  Human resources officer Violence: Not At Risk  . Fear of Current or Ex-Partner: No  . Emotionally Abused: No  . Physically Abused: No  . Sexually Abused: No    FAMILY HISTORY:  Family History  Problem Relation Age of Onset  . Cancer Mother   . Cancer Father   . Heart disease Father   . Cancer Brother     CURRENT MEDICATIONS:  Outpatient Encounter Medications as of 12/05/2019  Medication Sig  . amLODipine (NORVASC) 5 MG tablet Take 5 mg by mouth at bedtime.  Marland Kitchen ipratropium-albuterol (DUONEB) 0.5-2.5 (3) MG/3ML SOLN Take 3 mLs by nebulization 2 (two) times a day.   . metoprolol succinate  (TOPROL-XL) 100 MG 24 hr tablet Take 100 mg by mouth daily. Take with or immediately following a meal.  . SYMBICORT 160-4.5 MCG/ACT inhaler Inhale 2 puffs into the lungs 2 (two) times daily.   . vitamin C (ASCORBIC ACID) 500 MG tablet Take 500 mg by mouth 2 (two) times daily.  Marland Kitchen albuterol (PROVENTIL HFA;VENTOLIN HFA) 108 (90 Base) MCG/ACT inhaler Inhale 1 puff into the lungs every 6 (six) hours as needed for wheezing or shortness of breath.  . docusate sodium (COLACE) 100 MG capsule Take 1 capsule (100 mg total) by mouth 2 (two) times daily. (Patient not taking: Reported on 05/05/2019)  . LORazepam (ATIVAN) 0.5 MG tablet Take 0.25-0.5 mg by mouth at bedtime as needed.  Marland Kitchen Propylene Glycol (SYSTANE COMPLETE) 0.6 % SOLN Place 2 drops into both eyes daily as needed (for dry eyes).    No facility-administered encounter medications on file as of 12/05/2019.    ALLERGIES:  No Known Allergies   PHYSICAL EXAM:  ECOG Performance status: 1  Vitals:   12/05/19 1108  BP: 115/62  Pulse: 77  Resp: 18  Temp: (!) 97.3 F (36.3 C)  SpO2: 96%   Filed Weights   12/05/19 1108  Weight: 111 lb 11.2 oz (50.7 kg)    Physical Exam Vitals reviewed.  Constitutional:      Appearance: Normal appearance.  Cardiovascular:     Rate and Rhythm: Normal rate and regular rhythm.     Heart sounds: Normal heart sounds.  Pulmonary:     Effort: Pulmonary effort is normal.     Breath sounds: Normal breath sounds.  Abdominal:     General: There is no distension.     Palpations: Abdomen is soft. There is no mass.  Musculoskeletal:        General: No swelling.  Lymphadenopathy:     Cervical: No cervical adenopathy.  Skin:    General: Skin is warm.  Neurological:     General: No focal deficit present.     Mental Status: She is alert and oriented to person, place, and time.  Psychiatric:        Mood and Affect: Mood normal.        Behavior: Behavior normal.    No palpable axillary or supraclavicular  adenopathy.  LABORATORY DATA:  I have reviewed the labs as listed.  CBC    Component Value Date/Time   WBC 7.8 12/01/2019 1120   RBC 4.51 12/01/2019 1120   HGB 13.9 12/01/2019 1120   HCT 42.8 12/01/2019 1120   PLT 226 12/01/2019 1120   MCV 94.9 12/01/2019 1120   MCH 30.8 12/01/2019 1120  MCHC 32.5 12/01/2019 1120   RDW 13.4 12/01/2019 1120   LYMPHSABS 2.8 12/01/2019 1120   MONOABS 0.5 12/01/2019 1120   EOSABS 0.1 12/01/2019 1120   BASOSABS 0.1 12/01/2019 1120   CMP Latest Ref Rng & Units 12/01/2019 04/04/2019 03/15/2019  Glucose 70 - 99 mg/dL 104(H) 101(H) 100(H)  BUN 8 - 23 mg/dL 9 5(L) 5(L)  Creatinine 0.44 - 1.00 mg/dL 0.51 0.61 0.57  Sodium 135 - 145 mmol/L 137 140 142  Potassium 3.5 - 5.1 mmol/L 3.4(L) 3.3(L) 3.3(L)  Chloride 98 - 111 mmol/L 101 103 106  CO2 22 - 32 mmol/L 25 25 25   Calcium 8.9 - 10.3 mg/dL 9.4 9.5 9.7  Total Protein 6.5 - 8.1 g/dL 7.3 - -  Total Bilirubin 0.3 - 1.2 mg/dL 0.8 - -  Alkaline Phos 38 - 126 U/L 74 - -  AST 15 - 41 U/L 17 - -  ALT 0 - 44 U/L 14 - -       DIAGNOSTIC IMAGING:  I have reviewed her scans.    ASSESSMENT & PLAN:   Carcinoma of nail bed of digit of left hand 1.  Left thumb nailbed squamous cell carcinoma: -Lesion at the tip of the left thumb, waxing and waning over 10 years. -Excision of the left thumb nailbed consistent with squamous cell carcinoma, positive margins.  Left thumb amputation through IP joint on 04/04/2019 by Dr. Fredna Dow. -Pathology showed no residual squamous cell carcinoma, margins are free. 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  No other malignancies reported. -PET scan on 05/24/2019 showed no evidence of metastatic disease.  9 mm right lower lobe pulmonary nodule with no FDG uptake.  4.3 cm complex cystic lesion in the right kidney cannot be characterized.  No hypermetabolic activity associated with it. -Physical exam today did not reveal any evidence of  recurrence.  Labs reviewed by me were normal. -Labs from 11/18/2019 from Dr. Juel Burrow office showed slightly elevated absolute lymphocyte count.  This was normal on the labs from 12/01/2019. -I will see her back in 6 months for follow-up.  2.  Right kidney cyst: -UA was negative for hematuria. -I reviewed results of ultrasound of the kidneys on 12/01/2019 shows exophytic cluster of cysts or septated single cyst in the interpolar region of the right kidney measuring 4.3 x 2.5 x 3.5 cm.  No hydronephrosis. -We will likely follow-up on it in a year.      Orders placed this encounter:  Orders Placed This Encounter  Procedures  . CBC with Differential/Platelet  . Comprehensive metabolic panel  . Lactate dehydrogenase      Derek Jack, MD [place] [phone]

## 2019-12-06 ENCOUNTER — Telehealth: Payer: Self-pay

## 2019-12-06 NOTE — Telephone Encounter (Signed)
NOTES ON FILE FROM DR Edwyna Ready [facility] , SENT REFERRAL TO SCHEDULING

## 2019-12-12 DIAGNOSIS — J449 Chronic obstructive pulmonary disease, unspecified: Secondary | ICD-10-CM | POA: Diagnosis not present

## 2019-12-13 DIAGNOSIS — I2510 Atherosclerotic heart disease of native coronary artery without angina pectoris: Secondary | ICD-10-CM | POA: Diagnosis not present

## 2019-12-13 DIAGNOSIS — J441 Chronic obstructive pulmonary disease with (acute) exacerbation: Secondary | ICD-10-CM | POA: Diagnosis not present

## 2019-12-13 DIAGNOSIS — N281 Cyst of kidney, acquired: Secondary | ICD-10-CM | POA: Diagnosis not present

## 2020-01-04 ENCOUNTER — Other Ambulatory Visit: Payer: Self-pay

## 2020-01-04 ENCOUNTER — Encounter: Payer: Self-pay | Admitting: Pulmonary Disease

## 2020-01-04 ENCOUNTER — Ambulatory Visit: Payer: Medicare Other | Admitting: Pulmonary Disease

## 2020-01-04 VITALS — BP 130/88 | HR 82 | Temp 97.0°F | Ht 62.0 in | Wt 110.0 lb

## 2020-01-04 DIAGNOSIS — J449 Chronic obstructive pulmonary disease, unspecified: Secondary | ICD-10-CM

## 2020-01-04 DIAGNOSIS — J9611 Chronic respiratory failure with hypoxia: Secondary | ICD-10-CM

## 2020-01-04 NOTE — Progress Notes (Signed)
[facility]  Chief Complaint  Patient presents with  . Consult    Patient wears 2 liters oxygen only as needed. Patient has shortness of breath with exertion. Patient takes Symbicort and nebulizer. Patient has occasional dry cough.    Constitutional:  BP 130/88 (BP Location: Left Arm, Patient Position: Sitting, Cuff Size: Normal)   Pulse 82   Temp (!) 97 F (36.1 C) (Temporal)   Ht 5\' 2"  (1.575 m)   Wt 110 lb (49.9 kg)   SpO2 96%   BMI 20.12 kg/m   Past Medical History:  Squamous cell carcinoma nailbed, Renal cyst, OA, HTN, Macular degeneration, Pneumonia, Rheumatic fever, Shingles  Summary:  Debbie [name] is a 74 y.o. female former smoker with COPD/emphysema and chronic hypoxic respiratory failure.  Subjective:   Previously seen by Dr. Luan Pulling.  She has been maintained on symbicort two puffs in the morning, and duoneb bid.  She hasn't need to use albuterol much.  She has noticed more chest congest over the past few weeks, and thinks this is from allergies.  Not having fever, sore throat, wheezing, hemoptysis, chest pain, or leg swelling.  Occasionally gets cramps in her feet.  Uses 2 liters oxygen when she gets short of breath.  Doesn't have a pulse oximeter.  Gets winded easily and limits her activity.  Does okay at rest.   Physical Exam:   Appearance - well kempt  ENMT - no sinus tenderness, no nasal discharge, no oral exudate, Mallampati 2  Respiratory - prolonged exhalation, decreased breath sounds, no wheeze, or rales  CV - regular rate and rhythm, no murmurs  GI - soft, non tender  Lymph - no adenopathy noted in neck  Ext - no edema  Skin - no rashes  Neuro - normal strength, oriented x 3  Psych - normal mood and affect   Assessment/Plan:   COPD with emphysema. - wasn't able to tolerate breztri - continue symbicort two puffs in the morning, and will arrange for spacer device - advised her to try mucinex -  duoneb bid - albuterol prn - will need to discuss pneumococcal vaccine at next visit  Chronic respiratory failure with hypoxia. - will arrange for portable pulse oximeter - goal SpO2 > 90% - continue 2 liters O2 with exertion  COVID advice. - discussed various options for COVID vaccine - she will set up getting Pfizer or Moderna vaccine - she would not want J&J vaccine  A total of 39 minutes addressing patient care on the day of the visit.  Follow up:  Patient Instructions  Try using mucinex to help with cough and chest congestion  Will arrange for a spacer device to use with Symbicort  Follow up in 6 months   Signature:  Chesley Mires, MD [place] Pager: [phone] 01/04/2020, 11:48 AM  Flow Sheet     Pulmonary tests:  PFT 01/13/13 >> FEV1 0.63 (28%), FEV1% 28, TLC 6.12 (130%), DLCO 28%, +BD  Chest imaging:  CT angio chest 01/18/19 >> atherosclerosis, centrilobular emphysema, 8 mm RLL nodule  Cardiac tests:  Echo 11/23/15 >> mild LVH, EF 65 to 70%, grade 1 DD  Medications:   Allergies as of 01/04/2020   No Known Allergies     Medication List       Accurate as of Jan 04, 2020 11:48 AM. If you have any questions, ask your nurse or doctor.        albuterol 108 (90 Base) MCG/ACT  inhaler Commonly known as: VENTOLIN HFA Inhale 1 puff into the lungs every 6 (six) hours as needed for wheezing or shortness of breath.   amLODipine 5 MG tablet Commonly known as: NORVASC Take 5 mg by mouth at bedtime.   docusate sodium 100 MG capsule Commonly known as: COLACE Take 1 capsule (100 mg total) by mouth 2 (two) times daily.   ipratropium-albuterol 0.5-2.5 (3) MG/3ML Soln Commonly known as: DUONEB Take 3 mLs by nebulization 2 (two) times a day.   LORazepam 0.5 MG tablet Commonly known as: ATIVAN Take 0.25-0.5 mg by mouth at bedtime as needed.   metoprolol succinate 100 MG 24 hr tablet Commonly known as: TOPROL-XL Take 100 mg by mouth  daily. Take with or immediately following a meal.   Symbicort 160-4.5 MCG/ACT inhaler Generic drug: budesonide-formoterol Inhale 2 puffs into the lungs 2 (two) times daily.   Systane Complete 0.6 % Soln Generic drug: Propylene Glycol Place 2 drops into both eyes daily as needed (for dry eyes).   vitamin C 500 MG tablet Commonly known as: ASCORBIC ACID Take 500 mg by mouth 2 (two) times daily.       Past Surgical History:  She  has a past surgical history that includes Cholecystectomy; Cesarean section; Multiple tooth extractions; and Amputation (Left, 04/04/2019).  Family History:  Her family history includes Cancer in her brother, father, and mother; Heart disease in her father.  Social History:  She  reports that she quit smoking about 29 years ago. Her smoking use included cigarettes. She quit after 25.00 years of use. She has never used smokeless tobacco. She reports that she does not drink alcohol or use drugs.

## 2020-01-04 NOTE — Patient Instructions (Signed)
Try using mucinex to help with cough and chest congestion  Will arrange for a spacer device to use with Symbicort  Follow up in 6 months

## 2020-01-09 DIAGNOSIS — I2510 Atherosclerotic heart disease of native coronary artery without angina pectoris: Secondary | ICD-10-CM | POA: Diagnosis not present

## 2020-01-09 DIAGNOSIS — J441 Chronic obstructive pulmonary disease with (acute) exacerbation: Secondary | ICD-10-CM | POA: Diagnosis not present

## 2020-01-09 DIAGNOSIS — N281 Cyst of kidney, acquired: Secondary | ICD-10-CM | POA: Diagnosis not present

## 2020-01-11 DIAGNOSIS — J449 Chronic obstructive pulmonary disease, unspecified: Secondary | ICD-10-CM | POA: Diagnosis not present

## 2020-01-24 DIAGNOSIS — R1010 Upper abdominal pain, unspecified: Secondary | ICD-10-CM | POA: Diagnosis not present

## 2020-01-26 ENCOUNTER — Other Ambulatory Visit: Payer: Self-pay

## 2020-01-26 ENCOUNTER — Ambulatory Visit: Payer: Medicare Other | Admitting: Internal Medicine

## 2020-01-26 ENCOUNTER — Encounter: Payer: Self-pay | Admitting: Internal Medicine

## 2020-01-26 VITALS — BP 122/68 | HR 88 | Ht 61.5 in | Wt 110.8 lb

## 2020-01-26 DIAGNOSIS — I10 Essential (primary) hypertension: Secondary | ICD-10-CM | POA: Diagnosis not present
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
## 2020-01-26 NOTE — Patient Instructions (Signed)
Medication Instructions:  Your physician recommends that you continue on your current medications as directed. Please refer to the Current Medication list given to you today.  *If you need a refill on your cardiac medications before your next appointment, please call your pharmacy*   Lab Work: NONE   If you have labs (blood work) drawn today and your tests are completely normal, you will receive your results only by: Marland Kitchen MyChart Message (if you have MyChart) OR . A paper copy in the mail If you have any lab test that is abnormal or we need to change your treatment, we will call you to review the results.   Testing/Procedures: NONE   Follow-Up: At [facility], you and your health needs are our priority.  As part of our continuing mission to provide you with exceptional heart care, we have created designated Provider Care Teams.  These Care Teams include your primary Cardiologist (physician) and Advanced Practice Providers (APPs -  Physician Assistants and Nurse Practitioners) who all work together to provide you with the care you need, when you need it.  We recommend signing up for the patient portal called "MyChart".  Sign up information is provided on this After Visit Summary.  MyChart is used to connect with patients for Virtual Visits (Telemedicine).  Patients are able to view lab/test results, encounter notes, upcoming appointments, etc.  Non-urgent messages can be sent to your provider as well.   To learn more about what you can do with MyChart, go to [email or web address].    Your next appointment:    As needed   The format for your next appointment:   Either In Person or Virtual  Provider:   Dorris Carnes, MD   Other Instructions Thank you for choosing [facility]!

## 2020-02-03 ENCOUNTER — Other Ambulatory Visit: Payer: Self-pay

## 2020-02-03 ENCOUNTER — Encounter (HOSPITAL_COMMUNITY): Payer: Self-pay

## 2020-02-03 ENCOUNTER — Emergency Department (HOSPITAL_COMMUNITY)
Admission: EM | Admit: 2020-02-03 | Discharge: 2020-02-04 | Disposition: A | Discharge status: Home / Self Care | Payer: Medicare Other | Attending: Emergency Medicine | Admitting: Emergency Medicine

## 2020-02-03 DIAGNOSIS — S299XXA Unspecified injury of thorax, initial encounter: Secondary | ICD-10-CM | POA: Diagnosis not present

## 2020-02-03 DIAGNOSIS — J441 Chronic obstructive pulmonary disease with (acute) exacerbation: Secondary | ICD-10-CM | POA: Diagnosis not present

## 2020-02-03 DIAGNOSIS — I110 Hypertensive heart disease with heart failure: Secondary | ICD-10-CM | POA: Insufficient documentation

## 2020-02-03 DIAGNOSIS — R10813 Right lower quadrant abdominal tenderness: Secondary | ICD-10-CM | POA: Insufficient documentation

## 2020-02-03 DIAGNOSIS — I509 Heart failure, unspecified: Secondary | ICD-10-CM | POA: Insufficient documentation

## 2020-02-03 DIAGNOSIS — Z87891 Personal history of nicotine dependence: Secondary | ICD-10-CM | POA: Diagnosis not present

## 2020-02-03 DIAGNOSIS — S3991XA Unspecified injury of abdomen, initial encounter: Secondary | ICD-10-CM | POA: Diagnosis not present

## 2020-02-03 DIAGNOSIS — Z79899 Other long term (current) drug therapy: Secondary | ICD-10-CM | POA: Diagnosis not present

## 2020-02-03 DIAGNOSIS — I10 Essential (primary) hypertension: Secondary | ICD-10-CM | POA: Diagnosis not present

## 2020-02-03 DIAGNOSIS — R109 Unspecified abdominal pain: Secondary | ICD-10-CM | POA: Diagnosis not present

## 2020-02-03 DIAGNOSIS — R10811 Right upper quadrant abdominal tenderness: Secondary | ICD-10-CM | POA: Diagnosis not present

## 2020-02-03 DIAGNOSIS — R519 Headache, unspecified: Secondary | ICD-10-CM | POA: Diagnosis not present

## 2020-02-03 LAB — URINALYSIS, ROUTINE W REFLEX MICROSCOPIC
Bilirubin Urine: NEGATIVE
Glucose, UA: NEGATIVE mg/dL
Hgb urine dipstick: NEGATIVE
Ketones, ur: NEGATIVE mg/dL
Leukocytes,Ua: NEGATIVE
Nitrite: NEGATIVE
Protein, ur: NEGATIVE mg/dL
Specific Gravity, Urine: 1.012 (ref 1.005–1.030)
pH: 5 (ref 5.0–8.0)

## 2020-02-03 LAB — CBC
HCT: 43.7 % (ref 36.0–46.0)
Hemoglobin: 14.2 g/dL (ref 12.0–15.0)
MCH: 30.7 pg (ref 26.0–34.0)
MCHC: 32.5 g/dL (ref 30.0–36.0)
MCV: 94.4 fL (ref 80.0–100.0)
Platelets: 231 10*3/uL (ref 150–400)
RBC: 4.63 MIL/uL (ref 3.87–5.11)
RDW: 13.2 % (ref 11.5–15.5)
WBC: 9.8 10*3/uL (ref 4.0–10.5)
nRBC: 0 % (ref 0.0–0.2)

## 2020-02-03 LAB — COMPREHENSIVE METABOLIC PANEL
ALT: 19 U/L (ref 0–44)
AST: 22 U/L (ref 15–41)
Albumin: 4.8 g/dL (ref 3.5–5.0)
Alkaline Phosphatase: 75 U/L (ref 38–126)
Anion gap: 11 (ref 5–15)
BUN: 9 mg/dL (ref 8–23)
CO2: 27 mmol/L (ref 22–32)
Calcium: 9.4 mg/dL (ref 8.9–10.3)
Chloride: 100 mmol/L (ref 98–111)
Creatinine, Ser: 0.57 mg/dL (ref 0.44–1.00)
GFR calc Af Amer: >60 mL/min (ref >60)
GFR calc non Af Amer: >60 mL/min (ref >60)
Glucose, Bld: 102 mg/dL — ABNORMAL HIGH (ref 70–99)
Potassium: 3.5 mmol/L (ref 3.5–5.1)
Sodium: 138 mmol/L (ref 135–145)
Total Bilirubin: 0.6 mg/dL (ref 0.3–1.2)
Total Protein: 7.6 g/dL (ref 6.5–8.1)

## 2020-02-03 LAB — LIPASE, BLOOD: Lipase: 50 U/L (ref 11–51)

## 2020-02-03 NOTE — ED Triage Notes (Signed)
Pt complains of "her side getting hot" and then getting sick. pcp on 6/2 thinks its acid reflux but she doesn't think so. Also states yesterday she had a bad headache that went away.

## 2020-02-04 ENCOUNTER — Emergency Department (HOSPITAL_COMMUNITY): Payer: Medicare Other

## 2020-02-04 DIAGNOSIS — S3991XA Unspecified injury of abdomen, initial encounter: Secondary | ICD-10-CM | POA: Diagnosis not present

## 2020-02-04 DIAGNOSIS — R519 Headache, unspecified: Secondary | ICD-10-CM | POA: Diagnosis not present

## 2020-02-04 DIAGNOSIS — S299XXA Unspecified injury of thorax, initial encounter: Secondary | ICD-10-CM | POA: Diagnosis not present

## 2020-02-04 LAB — CBC WITH DIFFERENTIAL/PLATELET
Abs Immature Granulocytes: 0.02 10*3/uL (ref 0.00–0.07)
Basophils Absolute: 0.1 10*3/uL (ref 0.0–0.1)
Basophils Relative: 1 %
Eosinophils Absolute: 0.2 10*3/uL (ref 0.0–0.5)
Eosinophils Relative: 2 %
HCT: 44.2 % (ref 36.0–46.0)
Hemoglobin: 14.4 g/dL (ref 12.0–15.0)
Immature Granulocytes: 0 %
Lymphocytes Relative: 45 %
Lymphs Abs: 5.2 10*3/uL — ABNORMAL HIGH (ref 0.7–4.0)
MCH: 30.9 pg (ref 26.0–34.0)
MCHC: 32.6 g/dL (ref 30.0–36.0)
MCV: 94.8 fL (ref 80.0–100.0)
Monocytes Absolute: 0.8 10*3/uL (ref 0.1–1.0)
Monocytes Relative: 7 %
Neutro Abs: 5 10*3/uL (ref 1.7–7.7)
Neutrophils Relative %: 45 %
Platelets: 245 10*3/uL (ref 150–400)
RBC: 4.66 MIL/uL (ref 3.87–5.11)
RDW: 13.2 % (ref 11.5–15.5)
WBC: 11.2 10*3/uL — ABNORMAL HIGH (ref 4.0–10.5)
nRBC: 0 % (ref 0.0–0.2)

## 2020-02-04 LAB — TROPONIN I (HIGH SENSITIVITY)
Troponin I (High Sensitivity): 27 ng/L — ABNORMAL HIGH (ref <18)
Troponin I (High Sensitivity): 25 ng/L — ABNORMAL HIGH (ref <18)

## 2020-02-04 MED ORDER — IOHEXOL 350 MG/ML SOLN
100.0000 mL | Freq: Once | INTRAVENOUS | Status: AC | PRN
  Administered 2020-02-04: 100 mL via INTRAVENOUS

## 2020-02-04 MED ORDER — IOHEXOL 9 MG/ML PO SOLN
ORAL | Status: AC
  Filled 2020-02-04: qty 1000

## 2020-02-04 MED ORDER — ALBUTEROL SULFATE HFA 108 (90 BASE) MCG/ACT IN AERS
1.0000 | INHALATION_SPRAY | Freq: Four times a day (QID) | RESPIRATORY_TRACT | Status: AC | PRN
  Administered 2020-02-04: 1 via RESPIRATORY_TRACT
  Filled 2020-02-04: qty 6.7

## 2020-02-04 MED ORDER — AMOXICILLIN-POT CLAVULANATE 250-125 MG PO TABS
1.0000 | ORAL_TABLET | Freq: Once | ORAL | Status: AC
  Administered 2020-02-04: 1 via ORAL
  Filled 2020-02-04: qty 1

## 2020-02-04 MED ORDER — MOMETASONE FURO-FORMOTEROL FUM 100-5 MCG/ACT IN AERO
2.0000 | INHALATION_SPRAY | Freq: Two times a day (BID) | RESPIRATORY_TRACT | Status: DC
  Filled 2020-02-04: qty 8.8

## 2020-02-04 MED ORDER — AMOXICILLIN-POT CLAVULANATE 875-125 MG PO TABS
1.0000 | ORAL_TABLET | Freq: Two times a day (BID) | ORAL | 0 refills | Status: DC
Start: 2020-02-04 — End: 2020-06-05

## 2020-02-04 NOTE — ED Notes (Signed)
Pt able to tolerate oral intake of fluids.

## 2020-02-04 NOTE — Discharge Instructions (Signed)
Your testing is reassuring.  As we discussed your pain may be muscular you may have passed a kidney stone.  There is some inflammation of the left side of your colon from taking antibiotics as prescribed.  Follow-up with your doctor.  You should have a CT scan of your chest in 1 year to further evaluate your lung nodules.  Return to the ED if you develop new or worsening symptoms.

## 2020-02-04 NOTE — ED Notes (Addendum)
Patient asked for inhaler symbicort  A  dulera was obtained as hospital substitutes for symbicort.   patient refused.  Patient has been given albuterol inhaler by nurse.  Patient refused Covid test. Ruthe Mannan has been returned to pyxis.
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
## 2020-02-06 DIAGNOSIS — N281 Cyst of kidney, acquired: Secondary | ICD-10-CM | POA: Diagnosis not present

## 2020-02-06 DIAGNOSIS — J441 Chronic obstructive pulmonary disease with (acute) exacerbation: Secondary | ICD-10-CM | POA: Diagnosis not present

## 2020-02-06 DIAGNOSIS — I2510 Atherosclerotic heart disease of native coronary artery without angina pectoris: Secondary | ICD-10-CM | POA: Diagnosis not present

## 2020-02-08 DIAGNOSIS — C44609 Unspecified malignant neoplasm of skin of left upper limb, including shoulder: Secondary | ICD-10-CM | POA: Diagnosis not present

## 2020-02-08 DIAGNOSIS — F5101 Primary insomnia: Secondary | ICD-10-CM | POA: Diagnosis not present

## 2020-02-08 DIAGNOSIS — C4362 Malignant melanoma of left upper limb, including shoulder: Secondary | ICD-10-CM | POA: Diagnosis not present

## 2020-02-08 DIAGNOSIS — E43 Unspecified severe protein-calorie malnutrition: Secondary | ICD-10-CM | POA: Diagnosis not present

## 2020-02-08 DIAGNOSIS — C44621 Squamous cell carcinoma of skin of unspecified upper limb, including shoulder: Secondary | ICD-10-CM | POA: Diagnosis not present

## 2020-02-08 DIAGNOSIS — R109 Unspecified abdominal pain: Secondary | ICD-10-CM | POA: Diagnosis not present

## 2020-02-11 DIAGNOSIS — J449 Chronic obstructive pulmonary disease, unspecified: Secondary | ICD-10-CM | POA: Diagnosis not present

## 2020-02-24 ENCOUNTER — Ambulatory Visit: Payer: Medicare Other | Admitting: Internal Medicine

## 2020-03-07 DIAGNOSIS — N281 Cyst of kidney, acquired: Secondary | ICD-10-CM | POA: Diagnosis not present

## 2020-03-07 DIAGNOSIS — I2510 Atherosclerotic heart disease of native coronary artery without angina pectoris: Secondary | ICD-10-CM | POA: Diagnosis not present

## 2020-03-07 DIAGNOSIS — J441 Chronic obstructive pulmonary disease with (acute) exacerbation: Secondary | ICD-10-CM | POA: Diagnosis not present

## 2020-03-12 DIAGNOSIS — J449 Chronic obstructive pulmonary disease, unspecified: Secondary | ICD-10-CM | POA: Diagnosis not present

## 2020-04-12 DIAGNOSIS — J449 Chronic obstructive pulmonary disease, unspecified: Secondary | ICD-10-CM | POA: Diagnosis not present

## 2020-04-17 DIAGNOSIS — I10 Essential (primary) hypertension: Secondary | ICD-10-CM | POA: Diagnosis not present

## 2020-04-17 DIAGNOSIS — J441 Chronic obstructive pulmonary disease with (acute) exacerbation: Secondary | ICD-10-CM | POA: Diagnosis not present

## 2020-04-17 DIAGNOSIS — S2231XD Fracture of one rib, right side, subsequent encounter for fracture with routine healing: Secondary | ICD-10-CM | POA: Diagnosis not present

## 2020-04-19 DIAGNOSIS — R109 Unspecified abdominal pain: Secondary | ICD-10-CM | POA: Diagnosis not present

## 2020-04-24 ENCOUNTER — Encounter: Payer: Self-pay | Admitting: Internal Medicine

## 2020-05-08 DIAGNOSIS — J441 Chronic obstructive pulmonary disease with (acute) exacerbation: Secondary | ICD-10-CM | POA: Diagnosis not present

## 2020-05-08 DIAGNOSIS — N281 Cyst of kidney, acquired: Secondary | ICD-10-CM | POA: Diagnosis not present

## 2020-05-08 DIAGNOSIS — I2510 Atherosclerotic heart disease of native coronary artery without angina pectoris: Secondary | ICD-10-CM | POA: Diagnosis not present

## 2020-05-13 DIAGNOSIS — J449 Chronic obstructive pulmonary disease, unspecified: Secondary | ICD-10-CM | POA: Diagnosis not present

## 2020-06-01 DIAGNOSIS — Z712 Person consulting for explanation of examination or test findings: Secondary | ICD-10-CM | POA: Diagnosis not present

## 2020-06-01 DIAGNOSIS — E43 Unspecified severe protein-calorie malnutrition: Secondary | ICD-10-CM | POA: Diagnosis not present

## 2020-06-01 DIAGNOSIS — F5101 Primary insomnia: Secondary | ICD-10-CM | POA: Diagnosis not present

## 2020-06-01 DIAGNOSIS — S2220XD Unspecified fracture of sternum, subsequent encounter for fracture with routine healing: Secondary | ICD-10-CM | POA: Diagnosis not present

## 2020-06-01 DIAGNOSIS — R109 Unspecified abdominal pain: Secondary | ICD-10-CM | POA: Diagnosis not present

## 2020-06-05 ENCOUNTER — Other Ambulatory Visit: Payer: Self-pay

## 2020-06-05 ENCOUNTER — Inpatient Hospital Stay (HOSPITAL_COMMUNITY): Payer: Medicare Other | Attending: Hematology | Admitting: Hematology

## 2020-06-05 ENCOUNTER — Encounter (HOSPITAL_COMMUNITY): Payer: Self-pay | Admitting: Hematology

## 2020-06-05 ENCOUNTER — Inpatient Hospital Stay (HOSPITAL_COMMUNITY): Payer: Medicare Other

## 2020-06-05 VITALS — BP 129/86 | HR 72 | Temp 96.9°F | Resp 18 | Wt 110.1 lb

## 2020-06-05 DIAGNOSIS — Z79899 Other long term (current) drug therapy: Secondary | ICD-10-CM | POA: Diagnosis not present

## 2020-06-05 DIAGNOSIS — Z7951 Long term (current) use of inhaled steroids: Secondary | ICD-10-CM | POA: Diagnosis not present

## 2020-06-05 DIAGNOSIS — Z23 Encounter for immunization: Secondary | ICD-10-CM | POA: Diagnosis not present

## 2020-06-05 DIAGNOSIS — I10 Essential (primary) hypertension: Secondary | ICD-10-CM | POA: Diagnosis not present

## 2020-06-05 DIAGNOSIS — Z8249 Family history of ischemic heart disease and other diseases of the circulatory system: Secondary | ICD-10-CM | POA: Insufficient documentation

## 2020-06-05 DIAGNOSIS — Z87891 Personal history of nicotine dependence: Secondary | ICD-10-CM | POA: Diagnosis not present

## 2020-06-05 DIAGNOSIS — Z85828 Personal history of other malignant neoplasm of skin: Secondary | ICD-10-CM | POA: Insufficient documentation

## 2020-06-05 DIAGNOSIS — C44609 Unspecified malignant neoplasm of skin of left upper limb, including shoulder: Secondary | ICD-10-CM

## 2020-06-05 DIAGNOSIS — Z809 Family history of malignant neoplasm, unspecified: Secondary | ICD-10-CM | POA: Insufficient documentation

## 2020-06-05 DIAGNOSIS — J449 Chronic obstructive pulmonary disease, unspecified: Secondary | ICD-10-CM | POA: Diagnosis not present

## 2020-06-05 DIAGNOSIS — N281 Cyst of kidney, acquired: Secondary | ICD-10-CM

## 2020-06-05 LAB — CBC WITH DIFFERENTIAL/PLATELET
Abs Immature Granulocytes: 0.04 10*3/uL (ref 0.00–0.07)
Basophils Absolute: 0.1 10*3/uL (ref 0.0–0.1)
Basophils Relative: 1 %
Eosinophils Absolute: 0.1 10*3/uL (ref 0.0–0.5)
Eosinophils Relative: 1 %
HCT: 44.6 % (ref 36.0–46.0)
Hemoglobin: 14.6 g/dL (ref 12.0–15.0)
Immature Granulocytes: 0 %
Lymphocytes Relative: 27 %
Lymphs Abs: 2.9 10*3/uL (ref 0.7–4.0)
MCH: 30.9 pg (ref 26.0–34.0)
MCHC: 32.7 g/dL (ref 30.0–36.0)
MCV: 94.5 fL (ref 80.0–100.0)
Monocytes Absolute: 0.8 10*3/uL (ref 0.1–1.0)
Monocytes Relative: 7 %
Neutro Abs: 7.1 10*3/uL (ref 1.7–7.7)
Neutrophils Relative %: 64 %
Platelets: 251 10*3/uL (ref 150–400)
RBC: 4.72 MIL/uL (ref 3.87–5.11)
RDW: 13.2 % (ref 11.5–15.5)
WBC: 11 10*3/uL — ABNORMAL HIGH (ref 4.0–10.5)
nRBC: 0 % (ref 0.0–0.2)

## 2020-06-05 LAB — COMPREHENSIVE METABOLIC PANEL
ALT: 14 U/L (ref 0–44)
AST: 18 U/L (ref 15–41)
Albumin: 4.4 g/dL (ref 3.5–5.0)
Alkaline Phosphatase: 65 U/L (ref 38–126)
Anion gap: 11 (ref 5–15)
BUN: 12 mg/dL (ref 8–23)
CO2: 28 mmol/L (ref 22–32)
Calcium: 9.6 mg/dL (ref 8.9–10.3)
Chloride: 101 mmol/L (ref 98–111)
Creatinine, Ser: 0.59 mg/dL (ref 0.44–1.00)
GFR, Estimated: >60 mL/min (ref >60)
Glucose, Bld: 100 mg/dL — ABNORMAL HIGH (ref 70–99)
Potassium: 4.4 mmol/L (ref 3.5–5.1)
Sodium: 140 mmol/L (ref 135–145)
Total Bilirubin: 0.8 mg/dL (ref 0.3–1.2)
Total Protein: 7.6 g/dL (ref 6.5–8.1)

## 2020-06-05 LAB — LACTATE DEHYDROGENASE: LDH: 135 U/L (ref 98–192)

## 2020-06-05 MED ORDER — INFLUENZA VAC A&B SA ADJ QUAD 0.5 ML IM PRSY
0.5000 mL | PREFILLED_SYRINGE | Freq: Once | INTRAMUSCULAR | Status: AC
  Administered 2020-06-05: 0.5 mL via INTRAMUSCULAR
  Filled 2020-06-05: qty 0.5

## 2020-06-05 NOTE — Patient Instructions (Signed)
[facility] at [facility] Discharge Instructions  You were seen today by Dr. Katragadda. He went over your recent results. Dr. Katragadda will see you back in 6 months for labs and follow up.   Thank you for choosing [facility] at [facility] to provide your oncology and hematology care.  To afford each patient quality time with our provider, please arrive at least 15 minutes before your scheduled appointment time.   If you have a lab appointment with the Cancer Center please come in thru the Main Entrance and check in at the main information desk  You need to re-schedule your appointment should you arrive 10 or more minutes late.  We strive to give you quality time with our providers, and arriving late affects you and other patients whose appointments are after yours.  Also, if you no show three or more times for appointments you may be dismissed from the clinic at the providers discretion.     Again, thank you for choosing [facility].  Our hope is that these requests will decrease the amount of time that you wait before being seen by our physicians.       _____________________________________________________________  Should you have questions after your visit to [facility], please contact our office at [phone] between the hours of 8:00 a.m. and 4:30 p.m.  Voicemails left after 4:00 p.m. will not be returned until the following business day.  For prescription refill requests, have your pharmacy contact our office and allow 72 hours.    Cancer Center Support Programs:   > Cancer Support Group  2nd Tuesday of the month 1pm-2pm, Journey Room    

## 2020-06-05 NOTE — Progress Notes (Signed)
Patient tolerated flu vaccine with no complaints voiced.  Site clean and dry with no bruising or swelling noted at site.  Band aid applied.  VSS. 

## 2020-06-05 NOTE — Progress Notes (Signed)
Debbie [name], Debbie [name] [id number]   CLINIC:  Medical Oncology/Hematology  PCP:  Debbie Squibb, MD [address] 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    CANCER STAGING: Cancer Staging No matching staging information was found for the patient.  INTERVAL HISTORY:  [name], a 74 y.o. female, returns for routine follow-up of her left thumb nailbed squamous cell cancer and right renal cyst. Debbie [name] was last seen on 12/05/2019.  Today she is accompanied by her daughter. She reports feeling well. She denies having any new pains. Her breathing is stable with a [place].  She received her flu shot recently.   REVIEW OF SYSTEMS:  Review of Systems  Constitutional: Positive for fatigue (25%). Negative for appetite change.  Respiratory: Positive for shortness of breath.   Cardiovascular: Positive for chest pain (intermittent) and palpitations.  Genitourinary: Positive for bladder incontinence.   Musculoskeletal: Negative for arthralgias.  Neurological: Positive for dizziness (at night d/t inner ear issues) and headaches.  All other systems reviewed and are negative.   PAST MEDICAL/SURGICAL HISTORY:  Past Medical History:  Diagnosis Date  . Arthritis    hands  . Cancer (HCC)    chest- squamous thumb  . Complication of anesthesia 1997   Hard to awaken  . COPD (chronic obstructive pulmonary disease) ([place])   . Dyspnea   . Heart failure ([place]) 05/05/2019  . Hypertension   . Macular degeneration    " early stage"  . Pneumonia     x 2  . Rheumatic fever   . Shingles   . Supplemental oxygen dependent   . Wears dentures    full upper  . Wears glasses     Past Surgical History:  Procedure Laterality Date  . AMPUTATION Left 04/04/2019   Procedure: AMPUTATION LEFT THUMB;  Surgeon: Leanora Cover, MD;  Location: [facility];  Service: Orthopedics;  Laterality: Left;  . CESAREAN SECTION    . CHOLECYSTECTOMY    . MULTIPLE TOOTH EXTRACTIONS      SOCIAL HISTORY:  Social History   Socioeconomic History  . Marital status: Divorced    Spouse name: Not on file  . Number of children: 5  . Years of education: Not on file  . Highest education level: Not on file  Occupational History  . Not on file  Tobacco Use  . Smoking status: Former Smoker    Years: 25.00    Types: Cigarettes    Quit date: 1992    Years since quitting: 29.8  . Smokeless tobacco: Never Used  Vaping Use  . Vaping Use: Former  Substance and Sexual Activity  . Alcohol use: No  . Drug use: No  . Sexual activity: Not on file  Other Topics Concern  . Not on file  Social History Narrative  . Not on file   Social Determinants of Health   Financial Resource Strain:   . Difficulty of Paying Living Expenses: Not on file  Food Insecurity:   . Worried About Charity fundraiser in the Last Year: Not on file  . Ran Out of Food in the Last Year: Not on file  Transportation Needs:   .  Lack of Transportation (Medical): Not on file  . Lack of Transportation (Non-Medical): Not on file  Physical Activity:   . Days of Exercise per Week: Not on file  . Minutes of Exercise per Session: Not on file  Stress:   . Feeling of Stress : Not on file  Social Connections:   . Frequency of Communication with Friends and Family: Not on file  . Frequency of Social Gatherings with Friends and Family: Not on file  . Attends Religious Services: Not on file  . Active Member of Clubs or Organizations: Not on file  . Attends Archivist Meetings: Not on file  . Marital Status: Not on file  Intimate Partner Violence:   . Fear of Current or Ex-Partner: Not on file  . Emotionally Abused: Not  on file  . Physically Abused: Not on file  . Sexually Abused: Not on file    FAMILY HISTORY:  Family History  Problem Relation Age of Onset  . Cancer Mother   . Cancer Father   . Heart disease Father   . Cancer Brother     CURRENT MEDICATIONS:  Current Outpatient Medications  Medication Sig Dispense Refill  . albuterol (PROVENTIL HFA;VENTOLIN HFA) 108 (90 Base) MCG/ACT inhaler Inhale 1 puff into the lungs every 6 (six) hours as needed for wheezing or shortness of breath.    Marland Kitchen amLODipine (NORVASC) 5 MG tablet Take 5 mg by mouth at bedtime.  5  . docusate sodium (COLACE) 100 MG capsule Take 1 capsule (100 mg total) by mouth 2 (two) times daily. 10 capsule 0  . ipratropium-albuterol (DUONEB) 0.5-2.5 (3) MG/3ML SOLN Take 3 mLs by nebulization 2 (two) times a day.     Marland Kitchen LORazepam (ATIVAN) 0.5 MG tablet Take 0.25-0.5 mg by mouth at bedtime as needed.    . metoprolol succinate (TOPROL-XL) 100 MG 24 hr tablet Take 100 mg by mouth daily. Take with or immediately following a meal.    . Multiple Vitamins-Minerals (VISION-VITE PRESERVE PO) Take by mouth.    . pantoprazole (PROTONIX) 40 MG tablet Take 40 mg by mouth daily.    Marland Kitchen Propylene Glycol (SYSTANE COMPLETE) 0.6 % SOLN Place 2 drops into both eyes daily as needed (for dry eyes).     . SYMBICORT 160-4.5 MCG/ACT inhaler Inhale 2 puffs into the lungs 2 (two) times daily.     . vitamin C (ASCORBIC ACID) 500 MG tablet Take 500 mg by mouth 2 (two) times daily.     No current facility-administered medications for this visit.    ALLERGIES:  No Known Allergies  PHYSICAL EXAM:  Performance status (ECOG): 1 - Symptomatic but completely ambulatory  Vitals:   06/05/20 1149  BP: 129/86  Pulse: 72  Resp: 18  Temp: (!) 96.9 F (36.1 C)  SpO2: 96%   Wt Readings from Last 3 Encounters:  06/05/20 110 lb 1.6 oz (49.9 kg)  02/03/20 110 lb 12.8 oz (50.3 kg)  01/26/20 110 lb 12.8 oz (50.3 kg)   Physical Exam Vitals reviewed.  Constitutional:       Appearance: Normal appearance.     Interventions: Nasal cannula in place.  Cardiovascular:     Rate and Rhythm: Normal rate and regular rhythm.     Pulses: Normal pulses.     Heart sounds: Normal heart sounds.  Pulmonary:     Effort: Pulmonary effort is normal.     Breath sounds: Normal breath sounds.  Abdominal:     Palpations: Abdomen is soft.  There is no hepatomegaly, splenomegaly or mass.     Tenderness: There is no abdominal tenderness.     Hernia: No hernia is present.  Musculoskeletal:     Left forearm: No swelling, deformity, tenderness or bony tenderness.     Comments: Amputated distal phalange of L thumb  Lymphadenopathy:     Upper Body:     Left upper body: No supraclavicular, axillary or pectoral adenopathy.     Lower Body: No right inguinal adenopathy. No left inguinal adenopathy.  Neurological:     General: No focal deficit present.     Mental Status: She is alert and oriented to person, place, and time.  Psychiatric:        Mood and Affect: Mood normal.        Behavior: Behavior normal.      LABORATORY DATA:  I have reviewed the labs as listed.  CBC Latest Ref Rng & Units 06/05/2020 02/04/2020 02/03/2020  WBC 4.0 - 10.5 K/uL 11.0(H) 11.2(H) 9.8  Hemoglobin 12.0 - 15.0 g/dL 14.6 14.4 14.2  Hematocrit 36 - 46 % 44.6 44.2 43.7  Platelets 150 - 400 K/uL 251 245 231   CMP Latest Ref Rng & Units 06/05/2020 02/03/2020 12/01/2019  Glucose 70 - 99 mg/dL 100(H) 102(H) 104(H)  BUN 8 - 23 mg/dL 12 9 9   Creatinine 0.44 - 1.00 mg/dL 0.59 0.57 0.51  Sodium 135 - 145 mmol/L 140 138 137  Potassium 3.5 - 5.1 mmol/L 4.4 3.5 3.4(L)  Chloride 98 - 111 mmol/L 101 100 101  CO2 22 - 32 mmol/L 28 27 25   Calcium 8.9 - 10.3 mg/dL 9.6 9.4 9.4  Total Protein 6.5 - 8.1 g/dL 7.6 7.6 7.3  Total Bilirubin 0.3 - 1.2 mg/dL 0.8 0.6 0.8  Alkaline Phos 38 - 126 U/L 65 75 74  AST 15 - 41 U/L 18 22 17   ALT 0 - 44 U/L 14 19 14    Lab Results  Component Value Date   LDH 135 06/05/2020    LDH 136 05/06/2019    DIAGNOSTIC IMAGING:  I have independently reviewed the scans and discussed with the patient. No results found.   ASSESSMENT:  1.  Left thumb nailbed squamous cell carcinoma: -Lesion at the tip of the left thumb, waxing and waning over 10 years. -Excision of the left thumb nailbed consistent with squamous cell carcinoma, positive margins.  Left thumb amputation through IP joint on 04/04/2019 by Dr. Fredna Dow. -Pathology showed no residual squamous cell carcinoma, margins are free. 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  No other malignancies reported. -PET scan on 05/24/2019 showed no evidence of metastatic disease.  9 mm right lower lobe pulmonary nodule with no FDG uptake.  4.3 cm complex cystic lesion in the right kidney cannot be characterized.  No hypermetabolic activity associated with it. -CT CAP from 02/04/2020 done in the ER showed no acute cardiopulmonary disease.  1 cm right lower lobe pulmonary nodule unchanged compared to 12/2018 exam.  Colonic diverticulosis in the left lower abdomen/pelvis.  Right-sided renal cyst unchanged, consistent with benign finding.  4 mm nonobstructing right-sided renal stone.   2.  Right kidney cyst: -UA was negative for hematuria. -I reviewed results of ultrasound of the kidneys on 12/01/2019 shows exophytic cluster of cysts or septated single cyst in the interpolar region of the right kidney measuring 4.3 x 2.5 x 3.5 cm.  No hydronephrosis. -We will likely follow-up on it in a year.  PLAN:  1.  Left thumb nailbed squamous cell carcinoma: -No clinical signs or symptoms of recurrence.  Physical exam was within normal limits. -Reviewed labs from today which showed normal LFTs and creatinine and calcium. -White count is slightly elevated at 11 with normal differential.  Hemoglobin and platelets are normal.  LDH is normal. -Reviewed CT CAP from 02/04/2020 from ER visit for right lower quadrant  pain.  No evidence of metastatic disease. -RTC 6 months with labs.  2.  Right kidney cyst: -We will likely follow-up with renal ultrasound in a year.    Orders placed this encounter:  Orders Placed This Encounter  Procedures  . CBC with Differential/Platelet  . Comprehensive metabolic panel  . Lactate dehydrogenase     Derek Jack, MD [place] [phone]   I, Milinda Antis, am acting as a scribe for Dr. Sanda Linger.  I, Derek Jack MD, have reviewed the above documentation for accuracy and completeness, and I agree with the above.

## 2020-06-06 DIAGNOSIS — C44609 Unspecified malignant neoplasm of skin of left upper limb, including shoulder: Secondary | ICD-10-CM | POA: Diagnosis not present

## 2020-06-06 DIAGNOSIS — N281 Cyst of kidney, acquired: Secondary | ICD-10-CM | POA: Diagnosis not present

## 2020-06-06 DIAGNOSIS — I10 Essential (primary) hypertension: Secondary | ICD-10-CM | POA: Diagnosis not present

## 2020-06-06 DIAGNOSIS — J441 Chronic obstructive pulmonary disease with (acute) exacerbation: Secondary | ICD-10-CM | POA: Diagnosis not present

## 2020-06-06 DIAGNOSIS — R911 Solitary pulmonary nodule: Secondary | ICD-10-CM | POA: Diagnosis not present

## 2020-06-06 DIAGNOSIS — I2510 Atherosclerotic heart disease of native coronary artery without angina pectoris: Secondary | ICD-10-CM | POA: Diagnosis not present

## 2020-06-06 DIAGNOSIS — Z0001 Encounter for general adult medical examination with abnormal findings: Secondary | ICD-10-CM | POA: Diagnosis not present

## 2020-06-12 DIAGNOSIS — J449 Chronic obstructive pulmonary disease, unspecified: Secondary | ICD-10-CM | POA: Diagnosis not present

## 2020-06-18 ENCOUNTER — Other Ambulatory Visit: Payer: Self-pay

## 2020-06-18 ENCOUNTER — Encounter: Payer: Self-pay | Admitting: Gastroenterology

## 2020-06-18 ENCOUNTER — Ambulatory Visit (INDEPENDENT_AMBULATORY_CARE_PROVIDER_SITE_OTHER): Payer: Medicare Other | Admitting: Gastroenterology

## 2020-06-18 DIAGNOSIS — R933 Abnormal findings on diagnostic imaging of other parts of digestive tract: Secondary | ICD-10-CM

## 2020-06-18 DIAGNOSIS — R1031 Right lower quadrant pain: Secondary | ICD-10-CM | POA: Diagnosis not present

## 2020-06-18 DIAGNOSIS — K219 Gastro-esophageal reflux disease without esophagitis: Secondary | ICD-10-CM
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
## 2020-06-18 NOTE — Patient Instructions (Signed)
1. As discussed today, your last CT scan showed some possible sigmoid colon wall thickening but this could also have been from the colon not being expanded enough with contrast to get the best pictures possible. Colonoscopy is usually advised to make sure colon is normal but as discussed today, we will hold off on colonoscopy as you don't feel like you could complete at this time.  2. Continue pantoprazole daily. Call if you have any nausea, vomiting, heartburn that is not controlled with medication. 3. Return to the office in six weeks to see Dr. Abbey Chatters. Call sooner if your pain returns or you decide to pursue colonoscopy/

## 2020-06-19 NOTE — Progress Notes (Signed)
CC'ED TO PCP 

## 2020-07-09 DIAGNOSIS — I10 Essential (primary) hypertension: Secondary | ICD-10-CM | POA: Diagnosis not present

## 2020-07-09 DIAGNOSIS — R911 Solitary pulmonary nodule: Secondary | ICD-10-CM | POA: Diagnosis not present

## 2020-07-09 DIAGNOSIS — J441 Chronic obstructive pulmonary disease with (acute) exacerbation: Secondary | ICD-10-CM | POA: Diagnosis not present

## 2020-07-13 DIAGNOSIS — J449 Chronic obstructive pulmonary disease, unspecified: Secondary | ICD-10-CM | POA: Diagnosis not present

## 2020-08-01 ENCOUNTER — Other Ambulatory Visit: Payer: Self-pay

## 2020-08-01 ENCOUNTER — Encounter: Payer: Self-pay | Admitting: Internal Medicine

## 2020-08-01 ENCOUNTER — Ambulatory Visit: Payer: Medicare Other | Admitting: Internal Medicine

## 2020-08-01 VITALS — BP 136/69 | HR 88 | Temp 97.1°F | Ht 61.0 in | Wt 114.6 lb

## 2020-08-01 DIAGNOSIS — K219 Gastro-esophageal reflux disease without esophagitis: Secondary | ICD-10-CM

## 2020-08-01 DIAGNOSIS — R1031 Right lower quadrant pain: Secondary | ICD-10-CM | POA: Diagnosis not present

## 2020-08-01 DIAGNOSIS — D175 Benign lipomatous neoplasm of intra-abdominal organs: Secondary | ICD-10-CM | POA: Diagnosis not present

## 2020-08-01 DIAGNOSIS — D1779 Benign lipomatous neoplasm of other sites: Secondary | ICD-10-CM

## 2020-08-01 MED ORDER — DICYCLOMINE HCL 10 MG PO CAPS
10.0000 mg | ORAL_CAPSULE | Freq: Three times a day (TID) | ORAL | 1 refills | Status: DC | PRN
Start: 2020-08-01 — End: 2020-12-05

## 2020-08-01 NOTE — Progress Notes (Signed)
Referring Provider: Celene Squibb, MD Primary Care Physician:  Celene Squibb, MD Primary GI:  Dr. Abbey Chatters  Chief Complaint  Patient presents with  . Abdominal Pain    HPI:   Debbie [name] is a 74 y.o. female who presents today for follow-up visit.  Overall, patient is doing well.  She has chronic reflux which is well controlled on pantoprazole.  Denies any dysphagia odynophagia.  No previous EGD in our system.  She has a history of diverticulitis seen on CT imaging in June 2021.  She was treated with 2 rounds of antibiotics and her symptoms improved.  Patient notes that she will have sharp abdominal pain every few weeks.  States it is right lower quadrant in nature.  She was offered colonoscopy after previous visit and patient would like to hold off for now.  She states her abdominal pain is much improved.  Past Medical History:  Diagnosis Date  . Arthritis    hands  . Cancer (HCC)    chest- squamous thumb  . Complication of anesthesia 1997   Hard to awaken  . COPD (chronic obstructive pulmonary disease) ([place])   . Dyspnea   . Heart failure ([place]) 05/05/2019  . Hypertension   . Macular degeneration    " early stage"  . Pneumonia     x 2  . Rheumatic fever   . Shingles   . Supplemental oxygen dependent   . Wears dentures    full upper  . Wears glasses     Past Surgical History:  Procedure Laterality Date  . AMPUTATION Left 04/04/2019   Procedure: AMPUTATION LEFT THUMB;  Surgeon: Leanora Cover, MD;  Location: [facility];  Service: Orthopedics;  Laterality: Left;  . CESAREAN SECTION    . CHOLECYSTECTOMY    . MULTIPLE TOOTH EXTRACTIONS      Current Outpatient Medications  Medication Sig Dispense Refill  . albuterol (PROVENTIL HFA;VENTOLIN HFA) 108 (90 Base) MCG/ACT inhaler Inhale 1 puff into the lungs every 6 (six) hours as needed for wheezing or shortness of breath.    Marland Kitchen amLODipine (NORVASC) 5 MG tablet Take 5 mg by mouth at bedtime.  5  . gabapentin (NEURONTIN) 100 MG capsule  Take 100 mg by mouth 3 (three) times daily as needed.    Marland Kitchen ipratropium-albuterol (DUONEB) 0.5-2.5 (3) MG/3ML SOLN Take 3 mLs by nebulization 2 (two) times a day.     Marland Kitchen LORazepam (ATIVAN) 0.5 MG tablet Take 0.25-0.5 mg by mouth at bedtime as needed.    . metoprolol succinate (TOPROL-XL) 100 MG 24 hr tablet Take 100 mg by mouth daily. Take with or immediately following a meal.    . Multiple Vitamins-Minerals (VISION-VITE PRESERVE PO) Take by mouth daily.     . pantoprazole (PROTONIX) 40 MG tablet Take 40 mg by mouth daily.    Marland Kitchen Propylene Glycol (SYSTANE COMPLETE) 0.6 % SOLN Place 2 drops into both eyes daily as needed (for dry eyes).     . SYMBICORT 160-4.5 MCG/ACT inhaler Inhale 2 puffs into the lungs 2 (two) times daily.     Marland Kitchen dicyclomine (BENTYL) 10 MG capsule Take 1 capsule (10 mg total) by mouth 3 (three) times daily as needed for spasms (pain). 90 capsule 1   No current facility-administered medications for this visit.    Allergies as of 08/01/2020  . (No Known Allergies)    Family History  Problem Relation Age of Onset  . Cancer Mother   . Cancer Father   .  Heart disease Father   . Cancer Brother     Social History   Socioeconomic History  . Marital status: Divorced    Spouse name: Not on file  . Number of children: 5  . Years of education: Not on file  . Highest education level: Not on file  Occupational History  . Not on file  Tobacco Use  . Smoking status: Former Smoker    Years: 25.00    Types: Cigarettes    Quit date: 1992    Years since quitting: 29.9  . Smokeless tobacco: Never Used  Vaping Use  . Vaping Use: Former  Substance and Sexual Activity  . Alcohol use: No  . Drug use: No  . Sexual activity: Not on file  Other Topics Concern  . Not on file  Social History Narrative  . Not on file   Social Determinants of Health   Financial Resource Strain:   . Difficulty of Paying Living Expenses: Not on file  Food Insecurity:   . Worried About Paediatric nurse in the Last Year: Not on file  . Ran Out of Food in the Last Year: Not on file  Transportation Needs:   . Lack of Transportation (Medical): Not on file  . Lack of Transportation (Non-Medical): Not on file  Physical Activity:   . Days of Exercise per Week: Not on file  . Minutes of Exercise per Session: Not on file  Stress:   . Feeling of Stress : Not on file  Social Connections:   . Frequency of Communication with Friends and Family: Not on file  . Frequency of Social Gatherings with Friends and Family: Not on file  . Attends Religious Services: Not on file  . Active Member of Clubs or Organizations: Not on file  . Attends Archivist Meetings: Not on file  . Marital Status: Not on file    Subjective: Review of Systems  Constitutional: Negative for chills and fever.  HENT: Negative for congestion and hearing loss.   Eyes: Negative for blurred vision and double vision.  Respiratory: Negative for cough and shortness of breath.   Cardiovascular: Negative for chest pain and palpitations.  Gastrointestinal: Negative for abdominal pain, blood in stool, constipation, diarrhea, heartburn, melena and vomiting.  Genitourinary: Negative for dysuria and urgency.  Musculoskeletal: Negative for joint pain and myalgias.  Skin: Negative for itching and rash.  Neurological: Negative for dizziness and headaches.  Psychiatric/Behavioral: Negative for depression. The patient is not nervous/anxious.      Objective: BP 136/69   Pulse 88   Temp (!) 97.1 F (36.2 C)   Ht 5\' 1"  (1.549 m)   Wt 114 lb 9.6 oz (52 kg)   BMI 21.65 kg/m  Physical Exam Constitutional:      Appearance: Normal appearance.  HENT:     Head: Normocephalic and atraumatic.  Eyes:     Extraocular Movements: Extraocular movements intact.     Conjunctiva/sclera: Conjunctivae normal.  Cardiovascular:     Rate and Rhythm: Normal rate and regular rhythm.  Pulmonary:     Effort: Pulmonary effort is  normal.     Breath sounds: Normal breath sounds.  Abdominal:     General: Bowel sounds are normal.     Palpations: Abdomen is soft.  Musculoskeletal:        General: No swelling. Normal range of motion.     Cervical back: Normal range of motion and neck supple.  Skin:    General: Skin  is warm and dry.     Coloration: Skin is not jaundiced.  Neurological:     General: No focal deficit present.     Mental Status: She is alert and oriented to person, place, and time.  Psychiatric:        Mood and Affect: Mood normal.        Behavior: Behavior normal.      Assessment: *Chronic GERD-well-controlled on Protonix *Abdominal pain-intermittent *History of diverticulitis *Lipomatous IC valve  Plan: Patient's GERD well-controlled on Protonix.  We will continue.   For her intermittent abdominal pain I will send her in prescription for dicyclomine to take as needed.  States these episodes are infrequent.  We will continue to hold off on colonoscopy at this time per patient's wishes.  We may need to reevaluate this in the future.  Patient follow-up in 6 months or sooner if needed.  08/01/2020 1:45 PM   Disclaimer: This note was dictated with voice recognition software. Similar sounding words can inadvertently be transcribed and may not be corrected upon review.

## 2020-08-01 NOTE — Patient Instructions (Signed)
Continue on pantoprazole for chronic reflux.  Continue on gabapentin which appears to be helping your pain.  I am going to add a new medication called dicyclomine to take as needed versus for abdominal pain.  You can take this medication up to 3 times a day as needed.  Follow-up with GI in 3 months or sooner if needed.  At [facility] we value your feedback. You may receive a survey about your visit today. Please share your experience as we strive to create trusting relationships with our patients to provide genuine, compassionate, quality care.  We appreciate your understanding and patience as we review any laboratory studies, imaging, and other diagnostic tests that are ordered as we care for you. Our office policy is 5 business days for review of these results, and any emergent or urgent results are addressed in a timely manner for your best interest. If you do not hear from our office in 1 week, please contact us.   We also encourage the use of MyChart, which contains your medical information for your review as well. If you are not enrolled in this feature, an access code is on this after visit summary for your convenience. Thank you for allowing [place] to be involved in your care.  It was great to see you today!  I hope you have a great rest of your winter!!    Elon Alas. Abbey Chatters, D.O. Gastroenterology and Hepatology [facility]

## 2020-08-12 DIAGNOSIS — J449 Chronic obstructive pulmonary disease, unspecified: Secondary | ICD-10-CM | POA: Diagnosis not present

## 2020-08-24 DIAGNOSIS — J439 Emphysema, unspecified: Secondary | ICD-10-CM | POA: Diagnosis not present

## 2020-08-24 DIAGNOSIS — I10 Essential (primary) hypertension: Secondary | ICD-10-CM | POA: Diagnosis not present

## 2020-09-03 ENCOUNTER — Ambulatory Visit: Payer: Medicare Other | Admitting: Pulmonary Disease

## 2020-09-05 DIAGNOSIS — I2510 Atherosclerotic heart disease of native coronary artery without angina pectoris: Secondary | ICD-10-CM | POA: Diagnosis not present

## 2020-09-05 DIAGNOSIS — C44609 Unspecified malignant neoplasm of skin of left upper limb, including shoulder: Secondary | ICD-10-CM | POA: Diagnosis not present

## 2020-09-05 DIAGNOSIS — N281 Cyst of kidney, acquired: Secondary | ICD-10-CM | POA: Diagnosis not present

## 2020-09-05 DIAGNOSIS — J441 Chronic obstructive pulmonary disease with (acute) exacerbation: Secondary | ICD-10-CM | POA: Diagnosis not present

## 2020-09-05 DIAGNOSIS — R079 Chest pain, unspecified: Secondary | ICD-10-CM | POA: Diagnosis not present

## 2020-09-05 DIAGNOSIS — F5101 Primary insomnia: Secondary | ICD-10-CM | POA: Diagnosis not present

## 2020-09-05 DIAGNOSIS — R911 Solitary pulmonary nodule: Secondary | ICD-10-CM | POA: Diagnosis not present

## 2020-09-12 DIAGNOSIS — J449 Chronic obstructive pulmonary disease, unspecified: Secondary | ICD-10-CM | POA: Diagnosis not present

## 2020-09-22 DIAGNOSIS — I10 Essential (primary) hypertension: Secondary | ICD-10-CM | POA: Diagnosis not present

## 2020-09-22 DIAGNOSIS — I2510 Atherosclerotic heart disease of native coronary artery without angina pectoris: Secondary | ICD-10-CM | POA: Diagnosis not present

## 2020-09-22 DIAGNOSIS — J441 Chronic obstructive pulmonary disease with (acute) exacerbation: Secondary | ICD-10-CM | POA: Diagnosis not present

## 2020-09-22 DIAGNOSIS — J9601 Acute respiratory failure with hypoxia: Secondary | ICD-10-CM | POA: Diagnosis not present

## 2020-09-27 ENCOUNTER — Ambulatory Visit: Payer: Medicare Other | Admitting: Pulmonary Disease

## 2020-10-06 ENCOUNTER — Emergency Department (HOSPITAL_COMMUNITY): Payer: Medicare Other

## 2020-10-06 ENCOUNTER — Inpatient Hospital Stay (HOSPITAL_COMMUNITY)
Admission: EM | Admit: 2020-10-06 | Discharge: 2020-10-09 | DRG: 177 | Disposition: A | Discharge status: Home / Self Care | Payer: Medicare Other | Attending: Internal Medicine | Admitting: Internal Medicine

## 2020-10-06 ENCOUNTER — Encounter (HOSPITAL_COMMUNITY): Payer: Self-pay | Admitting: Emergency Medicine

## 2020-10-06 ENCOUNTER — Other Ambulatory Visit: Payer: Self-pay

## 2020-10-06 DIAGNOSIS — Z87891 Personal history of nicotine dependence: Secondary | ICD-10-CM

## 2020-10-06 DIAGNOSIS — R627 Adult failure to thrive: Secondary | ICD-10-CM | POA: Diagnosis not present

## 2020-10-06 DIAGNOSIS — I471 Supraventricular tachycardia, unspecified: Secondary | ICD-10-CM

## 2020-10-06 DIAGNOSIS — J9811 Atelectasis: Secondary | ICD-10-CM | POA: Diagnosis not present

## 2020-10-06 DIAGNOSIS — Z79899 Other long term (current) drug therapy: Secondary | ICD-10-CM | POA: Diagnosis not present

## 2020-10-06 DIAGNOSIS — R0602 Shortness of breath: Secondary | ICD-10-CM | POA: Diagnosis not present

## 2020-10-06 DIAGNOSIS — Z743 Need for continuous supervision: Secondary | ICD-10-CM | POA: Diagnosis not present

## 2020-10-06 DIAGNOSIS — U071 COVID-19: Principal | ICD-10-CM | POA: Diagnosis present

## 2020-10-06 DIAGNOSIS — E0590 Thyrotoxicosis, unspecified without thyrotoxic crisis or storm: Secondary | ICD-10-CM | POA: Diagnosis not present

## 2020-10-06 DIAGNOSIS — I700 Atherosclerosis of aorta: Secondary | ICD-10-CM | POA: Diagnosis not present

## 2020-10-06 DIAGNOSIS — Z9981 Dependence on supplemental oxygen: Secondary | ICD-10-CM | POA: Diagnosis not present

## 2020-10-06 DIAGNOSIS — A0839 Other viral enteritis: Secondary | ICD-10-CM | POA: Diagnosis present

## 2020-10-06 DIAGNOSIS — R197 Diarrhea, unspecified: Secondary | ICD-10-CM | POA: Diagnosis not present

## 2020-10-06 DIAGNOSIS — J440 Chronic obstructive pulmonary disease with acute lower respiratory infection: Secondary | ICD-10-CM | POA: Diagnosis not present

## 2020-10-06 DIAGNOSIS — Z794 Long term (current) use of insulin: Secondary | ICD-10-CM | POA: Diagnosis not present

## 2020-10-06 DIAGNOSIS — I10 Essential (primary) hypertension: Secondary | ICD-10-CM | POA: Diagnosis not present

## 2020-10-06 DIAGNOSIS — E876 Hypokalemia: Secondary | ICD-10-CM | POA: Diagnosis not present

## 2020-10-06 DIAGNOSIS — E875 Hyperkalemia: Secondary | ICD-10-CM | POA: Diagnosis not present

## 2020-10-06 DIAGNOSIS — R911 Solitary pulmonary nodule: Secondary | ICD-10-CM | POA: Diagnosis not present

## 2020-10-06 DIAGNOSIS — R7989 Other specified abnormal findings of blood chemistry: Secondary | ICD-10-CM | POA: Diagnosis not present

## 2020-10-06 DIAGNOSIS — R0689 Other abnormalities of breathing: Secondary | ICD-10-CM | POA: Diagnosis not present

## 2020-10-06 DIAGNOSIS — J449 Chronic obstructive pulmonary disease, unspecified: Secondary | ICD-10-CM | POA: Diagnosis not present

## 2020-10-06 DIAGNOSIS — R109 Unspecified abdominal pain: Secondary | ICD-10-CM | POA: Diagnosis not present

## 2020-10-06 DIAGNOSIS — K219 Gastro-esophageal reflux disease without esophagitis: Secondary | ICD-10-CM | POA: Diagnosis not present

## 2020-10-06 DIAGNOSIS — K5730 Diverticulosis of large intestine without perforation or abscess without bleeding: Secondary | ICD-10-CM | POA: Diagnosis not present

## 2020-10-06 DIAGNOSIS — J1282 Pneumonia due to coronavirus disease 2019: Secondary | ICD-10-CM | POA: Diagnosis not present

## 2020-10-06 DIAGNOSIS — E860 Dehydration: Secondary | ICD-10-CM | POA: Diagnosis not present

## 2020-10-06 DIAGNOSIS — R000 Tachycardia, unspecified: Secondary | ICD-10-CM | POA: Diagnosis not present

## 2020-10-06 DIAGNOSIS — J439 Emphysema, unspecified: Secondary | ICD-10-CM | POA: Diagnosis not present

## 2020-10-06 DIAGNOSIS — J9621 Acute and chronic respiratory failure with hypoxia: Secondary | ICD-10-CM | POA: Diagnosis present

## 2020-10-06 DIAGNOSIS — I2510 Atherosclerotic heart disease of native coronary artery without angina pectoris: Secondary | ICD-10-CM | POA: Diagnosis not present

## 2020-10-06 DIAGNOSIS — I499 Cardiac arrhythmia, unspecified: Secondary | ICD-10-CM | POA: Diagnosis not present

## 2020-10-06 LAB — D-DIMER, QUANTITATIVE: D-Dimer, Quant: 0.41 ug/mL-FEU (ref 0.00–0.50)

## 2020-10-06 LAB — URINALYSIS, ROUTINE W REFLEX MICROSCOPIC
Bilirubin Urine: NEGATIVE
Glucose, UA: NEGATIVE mg/dL
Hgb urine dipstick: NEGATIVE
Ketones, ur: 5 mg/dL — AB
Leukocytes,Ua: NEGATIVE
Nitrite: NEGATIVE
Protein, ur: NEGATIVE mg/dL
Specific Gravity, Urine: 1.005 (ref 1.005–1.030)
pH: 6 (ref 5.0–8.0)

## 2020-10-06 LAB — COMPREHENSIVE METABOLIC PANEL
ALT: 16 U/L (ref 0–44)
AST: 21 U/L (ref 15–41)
Albumin: 3.8 g/dL (ref 3.5–5.0)
Alkaline Phosphatase: 57 U/L (ref 38–126)
Anion gap: 11 (ref 5–15)
BUN: 8 mg/dL (ref 8–23)
CO2: 28 mmol/L (ref 22–32)
Calcium: 8.6 mg/dL — ABNORMAL LOW (ref 8.9–10.3)
Chloride: 97 mmol/L — ABNORMAL LOW (ref 98–111)
Creatinine, Ser: 0.52 mg/dL (ref 0.44–1.00)
GFR, Estimated: >60 mL/min (ref >60)
Glucose, Bld: 117 mg/dL — ABNORMAL HIGH (ref 70–99)
Potassium: 2.8 mmol/L — ABNORMAL LOW (ref 3.5–5.1)
Sodium: 136 mmol/L (ref 135–145)
Total Bilirubin: 0.7 mg/dL (ref 0.3–1.2)
Total Protein: 7.1 g/dL (ref 6.5–8.1)

## 2020-10-06 LAB — CBC WITH DIFFERENTIAL/PLATELET
Abs Immature Granulocytes: 0.02 10*3/uL (ref 0.00–0.07)
Basophils Absolute: 0 10*3/uL (ref 0.0–0.1)
Basophils Relative: 0 %
Eosinophils Absolute: 0 10*3/uL (ref 0.0–0.5)
Eosinophils Relative: 0 %
HCT: 43.4 % (ref 36.0–46.0)
Hemoglobin: 14.9 g/dL (ref 12.0–15.0)
Immature Granulocytes: 0 %
Lymphocytes Relative: 32 %
Lymphs Abs: 1.7 10*3/uL (ref 0.7–4.0)
MCH: 31.2 pg (ref 26.0–34.0)
MCHC: 34.3 g/dL (ref 30.0–36.0)
MCV: 90.8 fL (ref 80.0–100.0)
Monocytes Absolute: 0.7 10*3/uL (ref 0.1–1.0)
Monocytes Relative: 13 %
Neutro Abs: 2.9 10*3/uL (ref 1.7–7.7)
Neutrophils Relative %: 55 %
Platelets: 226 10*3/uL (ref 150–400)
RBC: 4.78 MIL/uL (ref 3.87–5.11)
RDW: 12.8 % (ref 11.5–15.5)
WBC: 5.2 10*3/uL (ref 4.0–10.5)
nRBC: 0 % (ref 0.0–0.2)

## 2020-10-06 LAB — C DIFFICILE QUICK SCREEN W PCR REFLEX
C Diff antigen: NEGATIVE
C Diff interpretation: NOT DETECTED
C Diff toxin: NEGATIVE

## 2020-10-06 LAB — LIPASE, BLOOD: Lipase: 64 U/L — ABNORMAL HIGH (ref 11–51)

## 2020-10-06 LAB — RESP PANEL BY RT-PCR (FLU A&B, COVID) ARPGX2
Influenza A by PCR: NEGATIVE
Influenza B by PCR: NEGATIVE
SARS Coronavirus 2 by RT PCR: POSITIVE — AB

## 2020-10-06 LAB — LACTATE DEHYDROGENASE: LDH: 165 U/L (ref 98–192)

## 2020-10-06 LAB — FIBRINOGEN: Fibrinogen: 494 mg/dL — ABNORMAL HIGH (ref 210–475)

## 2020-10-06 LAB — LACTIC ACID, PLASMA: Lactic Acid, Venous: 1.2 mmol/L (ref 0.5–1.9)

## 2020-10-06 LAB — FERRITIN: Ferritin: 314 ng/mL — ABNORMAL HIGH (ref 11–307)

## 2020-10-06 LAB — PROCALCITONIN: Procalcitonin: <0.1 ng/mL

## 2020-10-06 LAB — MAGNESIUM: Magnesium: 2 mg/dL (ref 1.7–2.4)

## 2020-10-06 LAB — TRIGLYCERIDES: Triglycerides: 69 mg/dL (ref <150)

## 2020-10-06 LAB — C-REACTIVE PROTEIN: CRP: 0.9 mg/dL (ref <1.0)

## 2020-10-06 LAB — BRAIN NATRIURETIC PEPTIDE: B Natriuretic Peptide: 23 pg/mL (ref 0.0–100.0)

## 2020-10-06 MED ORDER — PROPYLENE GLYCOL 0.6 % OP SOLN: 2.0000 [drp] | Freq: Every day | OPHTHALMIC | Status: DC | PRN

## 2020-10-06 MED ORDER — SODIUM CHLORIDE 0.9 % IV BOLUS
500.0000 mL | Freq: Once | INTRAVENOUS | Status: AC
Start: 2020-10-06 — End: 2020-10-06
  Administered 2020-10-06: 500 mL via INTRAVENOUS

## 2020-10-06 MED ORDER — LORAZEPAM 0.5 MG PO TABS
0.5000 mg | ORAL_TABLET | Freq: Every evening | ORAL | Status: DC | PRN
  Administered 2020-10-08: 0.5 mg via ORAL
  Filled 2020-10-06: qty 1

## 2020-10-06 MED ORDER — METOPROLOL TARTRATE 25 MG PO TABS
12.5000 mg | ORAL_TABLET | Freq: Two times a day (BID) | ORAL | Status: DC
  Administered 2020-10-06 – 2020-10-07 (×2): 12.5 mg via ORAL
  Filled 2020-10-06 (×2): qty 1

## 2020-10-06 MED ORDER — POTASSIUM CHLORIDE CRYS ER 20 MEQ PO TBCR
40.0000 meq | EXTENDED_RELEASE_TABLET | Freq: Once | ORAL | Status: DC
  Filled 2020-10-06: qty 2

## 2020-10-06 MED ORDER — HYPROMELLOSE (GONIOSCOPIC) 2.5 % OP SOLN
2.0000 [drp] | Freq: Every day | OPHTHALMIC | Status: DC | PRN
  Filled 2020-10-06: qty 15

## 2020-10-06 MED ORDER — IOHEXOL 300 MG/ML  SOLN
80.0000 mL | Freq: Once | INTRAMUSCULAR | Status: AC | PRN
  Administered 2020-10-06: 80 mL via INTRAVENOUS

## 2020-10-06 MED ORDER — SODIUM CHLORIDE 0.9 % IV BOLUS
500.0000 mL | Freq: Once | INTRAVENOUS | Status: AC
  Administered 2020-10-06: 500 mL via INTRAVENOUS

## 2020-10-06 MED ORDER — ONDANSETRON HCL 4 MG PO TABS: 4.0000 mg | ORAL_TABLET | Freq: Four times a day (QID) | ORAL | Status: DC | PRN

## 2020-10-06 MED ORDER — SODIUM CHLORIDE 0.9 % IV BOLUS: 1000.0000 mL | Freq: Once | INTRAVENOUS | Status: DC

## 2020-10-06 MED ORDER — IPRATROPIUM-ALBUTEROL 20-100 MCG/ACT IN AERS
1.0000 | INHALATION_SPRAY | Freq: Four times a day (QID) | RESPIRATORY_TRACT | Status: DC
  Administered 2020-10-06 – 2020-10-07 (×5): 1 via RESPIRATORY_TRACT
  Filled 2020-10-06: qty 4

## 2020-10-06 MED ORDER — POTASSIUM CHLORIDE 20 MEQ PO PACK
40.0000 meq | PACK | Freq: Two times a day (BID) | ORAL | Status: DC
  Administered 2020-10-06: 40 meq via ORAL
  Filled 2020-10-06: qty 2

## 2020-10-06 MED ORDER — POTASSIUM CHLORIDE CRYS ER 20 MEQ PO TBCR: 40.0000 meq | EXTENDED_RELEASE_TABLET | Freq: Once | ORAL | Status: DC

## 2020-10-06 MED ORDER — ENOXAPARIN SODIUM 40 MG/0.4ML ~~LOC~~ SOLN
40.0000 mg | SUBCUTANEOUS | Status: DC
  Administered 2020-10-06 – 2020-10-08 (×3): 40 mg via SUBCUTANEOUS
  Filled 2020-10-06 (×3): qty 0.4

## 2020-10-06 MED ORDER — PANTOPRAZOLE SODIUM 40 MG PO TBEC
40.0000 mg | DELAYED_RELEASE_TABLET | Freq: Every day | ORAL | Status: DC
  Administered 2020-10-06 – 2020-10-09 (×4): 40 mg via ORAL
  Filled 2020-10-06 (×4): qty 1

## 2020-10-06 MED ORDER — POTASSIUM CHLORIDE 10 MEQ/100ML IV SOLN
10.0000 meq | INTRAVENOUS | Status: AC
  Administered 2020-10-06 (×2): 10 meq via INTRAVENOUS
  Filled 2020-10-06 (×2): qty 100

## 2020-10-06 MED ORDER — POTASSIUM CHLORIDE 20 MEQ PO PACK
40.0000 meq | PACK | Freq: Once | ORAL | Status: AC
  Administered 2020-10-06: 40 meq via ORAL
  Filled 2020-10-06: qty 2

## 2020-10-06 MED ORDER — PREDNISONE 20 MG PO TABS
50.0000 mg | ORAL_TABLET | Freq: Every day | ORAL | Status: DC
  Administered 2020-10-09: 50 mg via ORAL
  Filled 2020-10-06: qty 1

## 2020-10-06 MED ORDER — ONDANSETRON HCL 4 MG/2ML IJ SOLN: 4.0000 mg | Freq: Four times a day (QID) | INTRAMUSCULAR | Status: DC | PRN

## 2020-10-06 MED ORDER — DICYCLOMINE HCL 10 MG PO CAPS: 10.0000 mg | ORAL_CAPSULE | Freq: Three times a day (TID) | ORAL | Status: DC | PRN

## 2020-10-06 MED ORDER — METHYLPREDNISOLONE SODIUM SUCC 40 MG IJ SOLR
0.5000 mg/kg | Freq: Two times a day (BID) | INTRAMUSCULAR | Status: AC
  Administered 2020-10-06 – 2020-10-09 (×6): 24.8 mg via INTRAVENOUS
  Filled 2020-10-06 (×6): qty 1

## 2020-10-06 MED ORDER — FLUTICASONE FUROATE-VILANTEROL 200-25 MCG/INH IN AEPB
1.0000 | INHALATION_SPRAY | Freq: Every day | RESPIRATORY_TRACT | Status: DC
  Administered 2020-10-06 – 2020-10-08 (×3): 1 via RESPIRATORY_TRACT
  Filled 2020-10-06: qty 28

## 2020-10-06 MED ORDER — POTASSIUM CHLORIDE IN NACL 20-0.9 MEQ/L-% IV SOLN
INTRAVENOUS | Status: DC
  Filled 2020-10-06: qty 1000

## 2020-10-06 NOTE — ED Notes (Signed)
Pt returned from CT °
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
## 2020-10-06 NOTE — Plan of Care (Signed)

## 2020-10-06 NOTE — ED Provider Notes (Signed)
  Face-to-face evaluation   History: Patient presents for evaluation of right-sided abdominal pain that comes and goes, is been present for at least a year.  She describes the pain is "burning."  She also feels like her throat is "broke."  She describes this as ongoing for about a week, and worsened when she sleeps with her mouth open.  She denies problems chewing or swallowing.  According to the daughter who is with her she has decreased oral intake, chronically.  Patient reports that food makes her abdomen hurt more.  She is currently been having loose bowel movements, twice a day for about a week.  She is not seen any blood in stool and is not vomiting.  She is taking her usual prescribed medicines including dicyclomine, without cessation of her discomfort.  She is being actively, evaluated and treated by gastroenterology.  Physical exam: Elderly female alert and cooperative who is conversant.  Heart regular rate and rhythm without murmur.  Lungs clear to auscultation anteriorly.  Abdomen is soft and mildly tender right upper and lower.  No rebound tenderness.  Medical screening examination/treatment/procedure(s) were conducted as a shared visit with non-physician practitioner(s) and myself.  I personally evaluated the patient during the encounter   Daleen Bo, MD 10/06/20 [phone]

## 2020-10-06 NOTE — ED Triage Notes (Signed)
Pt arrived by [facility] for abd pain, not being able to eat x3days. Oxygen on ems was 91% on 2L, ems gave neb tx oxygen 97% on 2L. Pt has hx of copd uses oxygen as needed.

## 2020-10-06 NOTE — ED Notes (Signed)
Date and time results received: 10/06/20 1309 (use smartphrase ".now" to insert current time)  Test: COVID Critical Value: COVID positive   Name of Provider Notified: Krista Blue  Orders Received? Or Actions Taken?: See new orders

## 2020-10-06 NOTE — ED Notes (Signed)
Pt transported to CT ?

## 2020-10-06 NOTE — H&P (Addendum)
History and Physical  Debbie [name] JJO:[id number] DOB: 1945/09/06 DOA: 10/06/2020   PCP: Celene Squibb, MD   Patient coming from: Home  Chief Complaint: generalized weakness  HPI:  Debbie [name] is a 75 y.o. female with medical history of COPD, hypertension, diverticulitis, GERD, coronary artery disease, left thumb nailbe SCC presenting with 1 week history of worsening generalized weakness.  Unfortunately, the patient is a difficult historian.  History is supplemented by the patient's daughter and review of medical record.  The patient's daughter who is also a little bit difficult historian.  Nevertheless, it appears that the patient has had decreased oral intake and increasing generalized weakness over the past week.  The patient normally lives by herself and is able to perform all activities of daily living.  However, the patient called her daughter to come over because she was having difficulty getting out of bed and preparing her breakfast on the morning of 10/06/2020 because of generalized weakness.  The patient normally is on 2 L nasal cannula which she only uses intermittently.  The patient has had to use her oxygen more frequently this past week.  She thinks she may be more short of breath but cannot clarify.  She has had nonproductive cough but denies any chest pain, vomiting, headache, neck pain.  She feels that her throat is "broke".  She denies any dysuria or hematuria. She states that she has had abdominal pain for the better part of at least a year.  After by multiple rounds of questioning, it appears that the patient has had loose stools this past week with increasing abdominal pain.  There is no hematochezia or melena. In the emergency department, patient had low-grade temperature 99.2 F.  She was tachycardic up to 124.  Blood pressures were soft with systolic blood pressure in the 110-120.  Oxygen saturation was 99% on 2 L.  BMP showed a potassium 2.8, sodium 136, serum creatinine  0.52.  LFTs were unremarkable.  Lipase 64.  WBC 5.2, hemoglobin 14.9, platelets 226,000.  EKG shows sinus rhythm with no ST ST wave changes.  Because the patient's generalized weakness and inability to care for herself with failure to thrive, admission was requested.  Assessment/Plan: Acute on chronic respiratory failure with hypoxia secondary COVID-19 -Although the patient has not had worsening oxygen desaturation, she has had some increase shortness of breath and had to use her oxygen more frequently this past week -Personally reviewed chest x-ray--chronic interstitial changes without consolidation; bibasilar scarring -Currently stable 2 L nasal cannula -Start IV Solu-Medrol  COVID-19 gastroenteritis -Manifested by nausea, abdominal pain, and loose stools -Check stool pathogen panel -Check C. difficile  Generalized weakness/failure to Thrive -PT evaluation -Primarily being driven by the patient's COVID-19 infection -Start IV fluids -UA neg for pyuria  Essential hypertension -Holding amlodipine secondary to soft blood pressure -Restart metoprolol at lower dose secondary to rebound tachycardia  COPD -Patient quit smoking 19 years ago after 25 pk year hx -Continue bronchodilators  Diverticulosis -10/06/2020 CT abdomen--sigmoid diverticula with mild wall thickening in the area likely representing hypertrophy from chronic diverticulosis -Dr. Patient has diverticulitis as the patient has normal white blood cell count and no abdominal pain on examination -Monitor clinically -Stool studies as discussed above  Hypokalemia -replete -add Kcl to IVF -check mag       Past Medical History:  Diagnosis Date  . Arthritis    hands  . Cancer (HCC)    chest- squamous thumb  .  Complication of anesthesia 1997   Hard to awaken  . COPD (chronic obstructive pulmonary disease) ([place])   . Dyspnea   . Heart failure ([place]) 05/05/2019  . Hypertension   . Macular degeneration    " early  stage"  . Pneumonia     x 2  . Rheumatic fever   . Shingles   . Supplemental oxygen dependent   . Wears dentures    full upper  . Wears glasses    Past Surgical History:  Procedure Laterality Date  . AMPUTATION Left 04/04/2019   Procedure: AMPUTATION LEFT THUMB;  Surgeon: Leanora Cover, MD;  Location: [facility];  Service: Orthopedics;  Laterality: Left;  . CESAREAN SECTION    . CHOLECYSTECTOMY    . MULTIPLE TOOTH EXTRACTIONS     Social History:  reports that she quit smoking about 30 years ago. Her smoking use included cigarettes. She quit after 25.00 years of use. She has never used smokeless tobacco. She reports that she does not drink alcohol and does not use drugs.   Family History  Problem Relation Age of Onset  . Cancer Mother   . Cancer Father   . Heart disease Father   . Cancer Brother      No Known Allergies   Prior to Admission medications   Medication Sig Start Date End Date Taking? Authorizing Provider  albuterol (PROVENTIL HFA;VENTOLIN HFA) 108 (90 Base) MCG/ACT inhaler Inhale 1 puff into the lungs every 6 (six) hours as needed for wheezing or shortness of breath.    [provider]  amLODipine (NORVASC) 5 MG tablet Take 5 mg by mouth at bedtime. 01/26/16   [provider]  dicyclomine (BENTYL) 10 MG capsule Take 1 capsule (10 mg total) by mouth 3 (three) times daily as needed for spasms (pain). 08/01/20 01/28/21  Eloise Harman, DO  gabapentin (NEURONTIN) 100 MG capsule Take 100 mg by mouth 3 (three) times daily as needed. 06/07/20   [provider]  ipratropium-albuterol (DUONEB) 0.5-2.5 (3) MG/3ML SOLN Take 3 mLs by nebulization 2 (two) times a day.     [provider]  LORazepam (ATIVAN) 0.5 MG tablet Take 0.25-0.5 mg by mouth at bedtime as needed. 12/01/19   [provider]  metoprolol succinate (TOPROL-XL) 100 MG 24 hr tablet Take 100 mg by mouth daily. Take with or immediately following a meal.    [provider]   Multiple Vitamins-Minerals (VISION-VITE PRESERVE PO) Take by mouth daily.     [provider]  pantoprazole (PROTONIX) 40 MG tablet Take 40 mg by mouth daily. 01/24/20   [provider]  Propylene Glycol (SYSTANE COMPLETE) 0.6 % SOLN Place 2 drops into both eyes daily as needed (for dry eyes).     [provider]  SYMBICORT 160-4.5 MCG/ACT inhaler Inhale 2 puffs into the lungs 2 (two) times daily.  09/28/15   [provider]    Review of Systems:  Constitutional:  No weight loss, night sweats, Fevers, chills Head&Eyes: No headache.  No vision loss.  No eye pain or scotoma ENT:  No Difficulty swallowing,Tooth/dental problems,Sore throat,  No ear ache, post nasal drip,  Cardio-vascular:  No chest pain, Orthopnea, PND, swelling in lower extremities,  dizziness, palpitations  GI:  No   vomiting, d lo hematochezia, melena, heartburn, indigestion, Resp:  . No coughing up olood .No wheezing.No chest wall deformity  Skin:  no rash or lesions.  GU:  no dysuria, change in color of urine, no urgency  or frequency. No flank pain.  Musculoskeletal:  No joint pain or swelling. No decreased range of motion. No back pain.  Psych:  No change in mood or affect. No depression or anxiety. Neurologic: No headache, no dysesthesia, no focal weakness, no vision loss. No syncope  Physical Exam: Vitals:   10/06/20 1330 10/06/20 1345 10/06/20 1400 10/06/20 1415  BP: (!) 122/92  110/80   Pulse: (!) 111 (!) 107 (!) 113 (!) 103  Resp: (!) 21 (!) 23 (!) 28 (!) 21  Temp:      TempSrc:      SpO2: 100% 100% 99% 99%  Weight:      Height:       General:  A&O x 3, NAD, nontoxic, pleasant/cooperative Head/Eye: No conjunctival hemorrhage, no icterus, [place]/AT, No nystagmus ENT:  No icterus,  No thrush, good dentition, no pharyngeal exudate Neck:  No masses, no lymphadenpathy, no bruits CV:  RRR, no rub, no gallop, no S3 Lung: Diminished breath sounds bilateral.  Bibasilar  rales.  No wheezing. Abdomen: soft/NT, +BS, nondistended, no peritoneal signs Ext: No cyanosis, No rashes, No petechiae, No lymphangitis, No edema Neuro: CNII-XII intact, strength 4/5 in bilateral upper and lower extremities, no dysmetria  Labs on Admission:  Basic Metabolic Panel: Recent Labs  Lab 10/06/20 1056  NA 136  K 2.8*  CL 97*  CO2 28  GLUCOSE 117*  BUN 8  CREATININE 0.52  CALCIUM 8.6*  MG 2.0   Liver Function Tests: Recent Labs  Lab 10/06/20 1056  AST 21  ALT 16  ALKPHOS 57  BILITOT 0.7  PROT 7.1  ALBUMIN 3.8   Recent Labs  Lab 10/06/20 1056  LIPASE 64*   No results for input(s): AMMONIA in the last 168 hours. CBC: Recent Labs  Lab 10/06/20 1056  WBC 5.2  NEUTROABS 2.9  HGB 14.9  HCT 43.4  MCV 90.8  PLT 226   Coagulation Profile: No results for input(s): INR, PROTIME in the last 168 hours. Cardiac Enzymes: No results for input(s): CKTOTAL, CKMB, CKMBINDEX, TROPONINI in the last 168 hours. BNP: Invalid input(s): POCBNP CBG: No results for input(s): GLUCAP in the last 168 hours. Urine analysis:    Component Value Date/Time   COLORURINE YELLOW 10/06/2020 1111   APPEARANCEUR CLEAR 10/06/2020 1111   LABSPEC 1.005 10/06/2020 1111   PHURINE 6.0 10/06/2020 1111   GLUCOSEU NEGATIVE 10/06/2020 1111   HGBUR NEGATIVE 10/06/2020 1111   BILIRUBINUR NEGATIVE 10/06/2020 1111   KETONESUR 5 (A) 10/06/2020 1111   PROTEINUR NEGATIVE 10/06/2020 1111   NITRITE NEGATIVE 10/06/2020 1111   LEUKOCYTESUR NEGATIVE 10/06/2020 1111   Sepsis Labs: @LABRCNTIP (procalcitonin:4,lacticidven:4) ) Recent Results (from the past 240 hour(s))  Blood culture (routine x 2)     Status: None (Preliminary result)   Collection Time: 10/06/20 10:56 AM   Specimen: BLOOD  Result Value Ref Range Status   Specimen Description BLOOD BLOOD RIGHT HAND  Final   Special Requests   Final    BOTTLES DRAWN AEROBIC AND ANAEROBIC Blood Culture adequate volume   Culture   Final    NO  GROWTH < 12 HOURS Performed at [facility], [address], [place], [place] [address]    Report Status PENDING  Incomplete  Blood culture (routine x 2)     Status: None (Preliminary result)   Collection Time: 10/06/20 10:56 AM   Specimen: BLOOD  Result Value Ref Range Status   Specimen Description BLOOD BLOOD LEFT HAND  Final   Special Requests  Final    BOTTLES DRAWN AEROBIC AND ANAEROBIC Blood Culture adequate volume   Culture   Final    NO GROWTH < 12 HOURS Performed at [facility], [address] [place], [place] [address]    Report Status PENDING  Incomplete  Resp Panel by RT-PCR (Flu A&B, Covid) Nasopharyngeal Swab     Status: Abnormal   Collection Time: 10/06/20 10:56 AM   Specimen: Nasopharyngeal Swab; Nasopharyngeal(NP) swabs in vial transport medium  Result Value Ref Range Status   SARS Coronavirus 2 by RT PCR POSITIVE (A) NEGATIVE Final    Comment: RESULT CALLED TO, READ BACK BY AND VERIFIED WITH: MCMILLIAN B. @ 1307 ON [id number] BY HENDERSON L. (NOTE) SARS-CoV-2 target nucleic acids are DETECTED.  The SARS-CoV-2 RNA is generally detectable in upper respiratory specimens during the acute phase of infection. Positive results are indicative of the presence of the identified virus, but do not rule out bacterial infection or co-infection with other pathogens not detected by the test. Clinical correlation with patient history and other diagnostic information is necessary to determine patient infection status. The expected result is Negative.  Fact Sheet for Patients: EntrepreneurPulse.com.au  Fact Sheet for Healthcare Providers: IncredibleEmployment.be  This test is not yet approved or cleared by the [place] FDA and  has been authorized for detection and/or diagnosis of SARS-CoV-2 by FDA under an Emergency Use Authorization (EUA).  This EUA will remain in effect (meaning this  test can be used) for the duration of  the  COVID-19 declaration under Section 564(b)(1) of the Act, 21 U.S.C. section 360bbb-3(b)(1), unless the authorization is terminated or revoked sooner.     Influenza A by PCR NEGATIVE NEGATIVE Final   Influenza B by PCR NEGATIVE NEGATIVE Final    Comment: (NOTE) The Xpert Xpress SARS-CoV-2/FLU/RSV plus assay is intended as an aid in the diagnosis of influenza from Nasopharyngeal swab specimens and should not be used as a sole basis for treatment. Nasal washings and aspirates are unacceptable for Xpert Xpress SARS-CoV-2/FLU/RSV testing.  Fact Sheet for Patients: EntrepreneurPulse.com.au  Fact Sheet for Healthcare Providers: IncredibleEmployment.be  This test is not yet approved or cleared by the [place] FDA and has been authorized for detection and/or diagnosis of SARS-CoV-2 by FDA under an Emergency Use Authorization (EUA). This EUA will remain in effect (meaning this test can be used) for the duration of the COVID-19 declaration under Section 564(b)(1) of the Act, 21 U.S.C. section 360bbb-3(b)(1), unless the authorization is terminated or revoked.  Performed at [facility], [address], [place], [place] [address]      Radiological Exams on Admission: CT ABDOMEN PELVIS W CONTRAST  Result Date: 10/06/2020 CLINICAL DATA:  Lower abdominal pain EXAM: CT ABDOMEN AND PELVIS WITH CONTRAST TECHNIQUE: Multidetector CT imaging of the abdomen and pelvis was performed using the standard protocol following bolus administration of intravenous contrast. CONTRAST:  41mL OMNIPAQUE IOHEXOL 300 MG/ML  SOLN COMPARISON:  February 04, 2020 CT abdomen and pelvis; May 10/14/2018 CT abdomen and pelvis. FINDINGS: Lower chest: There is scarring in the lung bases. There is a stable nodular opacity in the periphery of the lateral segment of the right lower lobe measuring 9 x 6 mm. This nodular opacity has been stable compared to prior studies. Hepatobiliary: There is a  stable subcentimeter focus of decreased attenuation in the right lobe of the liver inferiorly, a likely tiny cyst. There is evidence of fatty infiltration near the fissure for the ligamentum teres, stable. No new liver lesions  are evident. Gallbladder is absent. There is no appreciable biliary duct dilatation. Pancreas: There is no pancreatic mass or inflammatory focus. Spleen: No splenic lesions are evident. Adrenals/Urinary Tract: Adrenals bilaterally appear normal. There is a stable lobulated predominantly cystic mass containing foci of calcification in the lateral aspect of the right kidney measuring 3.8 x 3.5 cm. No new renal mass evident. There is an extrarenal pelvis on each side, an anatomic variant. There is no evident renal or ureteral calculus on either side. Urinary bladder is midline with wall thickness within normal limits. Stomach/Bowel: There are mid to distal sigmoid diverticula without diverticular inflammation. Mild wall thickening in this area is stable and likely reflects muscular hypertrophy from chronic diverticulosis. There is no surrounding mesenteric thickening or fluid in this area. No bowel wall thickening is seen elsewhere. The terminal ileum appears normal. There is stable fatty infiltration in the ileocecal valve. There is no evident bowel obstruction. There is no appreciable free air or portal venous air. Vascular/Lymphatic: There is no abdominal aortic aneurysm. There is aortic and iliac artery atherosclerosis. Major venous structures appear patent. There is no evident adenopathy in the abdomen or pelvis. Reproductive: The uterus is anteverted. There is no evident adnexal masses. Other: The appendix appears normal. There is no evident abscess or ascites in the abdomen or pelvis. There is fat in the right inguinal ring, stable. Musculoskeletal: There is stable anterior wedging of the L5 vertebral body. There are no blastic or lytic bone lesions. There is a degree of spinal stenosis at  L4-5 due to bony hypertrophy and disc protrusion. No intramuscular lesions are demonstrable. IMPRESSION: 1. There are stable sigmoid diverticula with mild wall thickening in this area. This wall thickening likely represents muscular hypertrophy from chronic diverticulosis. Earliest changes of diverticulitis could present in this manner, although there are no irregular diverticula or mesenteric thickening in this area. No surrounding fluid in this area. 2. No bowel obstruction. No abscess in the abdomen or pelvis. Appendix appears normal. 3. Stable lobulated predominantly cystic mass arising from the lateral right kidney containing stable foci of calcification. Stability of this lesion over time suggests benign etiology. No renal or ureteral calculus evident. No hydronephrosis. Urinary bladder wall thickness normal. 4. Nodular opacity arising from the lateral segment of the right lower lobe measuring 9 x 6 mm appears stable compared to prior studies. No new parenchymal lung lesion in the bases. 5. There is a degree of spinal stenosis at L4-5 due to bony hypertrophy and disc protrusion. Stable anterior wedging of the L5 vertebral body. 6.  Aortic Atherosclerosis (ICD10-I70.0). 7.  Gallbladder absent. Electronically Signed   By: Lowella Grip III M.D.   On: 10/06/2020 13:29   DG Chest Portable 1 View  Result Date: 10/06/2020 CLINICAL DATA:  Shortness of breath.  Abdominal pain.  COPD. EXAM: PORTABLE CHEST 1 VIEW COMPARISON:  Chest CT 02/04/2020 and chest radiograph 01/18/2019 FINDINGS: Severe emphysema. Interstitial accentuation at the lung bases. 1.8 by 1.0 cm density at the left lung base probably from atelectasis or scarring given the similar appearance on prior chest CT. Peripheral pulmonary nodule in the right lower lobe measuring about 0.9 cm in diameter, similar to prior CT of 02/04/2020. Atherosclerotic calcification of the aortic arch. IMPRESSION: 1. Severe emphysema with chronic interstitial  accentuation at the lung bases. 2. Stable pulmonary nodule in the right lower lobe. 3. Stable left basilar scarring. Electronically Signed   By: Van Clines M.D.   On: 10/06/2020 11:51  EKG: Independently reviewed. Sinus tachycardia, no STT changes    Time spent:60 minutes Code Status:   FULL Family Communication:  Daughter updated 2/12 Disposition Plan: expect 1-2 day hospitalization Consults called: none DVT Prophylaxis: [place] Lovenox  Orson Eva, DO  [facility] Pager [phone]  If 7PM-7AM, please contact night-coverage [email or web address] Password [facility] 10/06/2020, 3:13 PM

## 2020-10-06 NOTE — ED Notes (Signed)
Report called to [phone] 

## 2020-10-07 DIAGNOSIS — A0839 Other viral enteritis: Secondary | ICD-10-CM | POA: Diagnosis present

## 2020-10-07 DIAGNOSIS — Z9981 Dependence on supplemental oxygen: Secondary | ICD-10-CM | POA: Diagnosis not present

## 2020-10-07 DIAGNOSIS — E875 Hyperkalemia: Secondary | ICD-10-CM | POA: Diagnosis present

## 2020-10-07 DIAGNOSIS — J9621 Acute and chronic respiratory failure with hypoxia: Secondary | ICD-10-CM

## 2020-10-07 DIAGNOSIS — J440 Chronic obstructive pulmonary disease with acute lower respiratory infection: Secondary | ICD-10-CM | POA: Diagnosis present

## 2020-10-07 DIAGNOSIS — Z87891 Personal history of nicotine dependence: Secondary | ICD-10-CM | POA: Diagnosis not present

## 2020-10-07 DIAGNOSIS — I471 Supraventricular tachycardia: Secondary | ICD-10-CM | POA: Diagnosis not present

## 2020-10-07 DIAGNOSIS — I10 Essential (primary) hypertension: Secondary | ICD-10-CM | POA: Diagnosis present

## 2020-10-07 DIAGNOSIS — Z794 Long term (current) use of insulin: Secondary | ICD-10-CM | POA: Diagnosis not present

## 2020-10-07 DIAGNOSIS — K5730 Diverticulosis of large intestine without perforation or abscess without bleeding: Secondary | ICD-10-CM | POA: Diagnosis present

## 2020-10-07 DIAGNOSIS — J1282 Pneumonia due to coronavirus disease 2019: Secondary | ICD-10-CM | POA: Diagnosis present

## 2020-10-07 DIAGNOSIS — E876 Hypokalemia: Secondary | ICD-10-CM | POA: Diagnosis present

## 2020-10-07 DIAGNOSIS — I2510 Atherosclerotic heart disease of native coronary artery without angina pectoris: Secondary | ICD-10-CM | POA: Diagnosis present

## 2020-10-07 DIAGNOSIS — U071 COVID-19: Secondary | ICD-10-CM | POA: Diagnosis present

## 2020-10-07 DIAGNOSIS — R627 Adult failure to thrive: Secondary | ICD-10-CM | POA: Diagnosis present

## 2020-10-07 DIAGNOSIS — K219 Gastro-esophageal reflux disease without esophagitis: Secondary | ICD-10-CM | POA: Diagnosis present

## 2020-10-07 DIAGNOSIS — Z79899 Other long term (current) drug therapy: Secondary | ICD-10-CM | POA: Diagnosis not present

## 2020-10-07 DIAGNOSIS — E0590 Thyrotoxicosis, unspecified without thyrotoxic crisis or storm: Secondary | ICD-10-CM | POA: Diagnosis present

## 2020-10-07 LAB — CBC WITH DIFFERENTIAL/PLATELET
Abs Immature Granulocytes: 0.01 10*3/uL (ref 0.00–0.07)
Basophils Absolute: 0 10*3/uL (ref 0.0–0.1)
Basophils Relative: 0 %
Eosinophils Absolute: 0 10*3/uL (ref 0.0–0.5)
Eosinophils Relative: 0 %
HCT: 41.7 % (ref 36.0–46.0)
Hemoglobin: 13.8 g/dL (ref 12.0–15.0)
Immature Granulocytes: 0 %
Lymphocytes Relative: 35 %
Lymphs Abs: 1.1 10*3/uL (ref 0.7–4.0)
MCH: 30.7 pg (ref 26.0–34.0)
MCHC: 33.1 g/dL (ref 30.0–36.0)
MCV: 92.9 fL (ref 80.0–100.0)
Monocytes Absolute: 0.1 10*3/uL (ref 0.1–1.0)
Monocytes Relative: 4 %
Neutro Abs: 1.9 10*3/uL (ref 1.7–7.7)
Neutrophils Relative %: 61 %
Platelets: 213 10*3/uL (ref 150–400)
RBC: 4.49 MIL/uL (ref 3.87–5.11)
RDW: 13 % (ref 11.5–15.5)
WBC: 3.2 10*3/uL — ABNORMAL LOW (ref 4.0–10.5)
nRBC: 0 % (ref 0.0–0.2)

## 2020-10-07 LAB — COMPREHENSIVE METABOLIC PANEL
ALT: 14 U/L (ref 0–44)
AST: 17 U/L (ref 15–41)
Albumin: 3.2 g/dL — ABNORMAL LOW (ref 3.5–5.0)
Alkaline Phosphatase: 50 U/L (ref 38–126)
Anion gap: 10 (ref 5–15)
BUN: 5 mg/dL — ABNORMAL LOW (ref 8–23)
CO2: 26 mmol/L (ref 22–32)
Calcium: 8.8 mg/dL — ABNORMAL LOW (ref 8.9–10.3)
Chloride: 103 mmol/L (ref 98–111)
Creatinine, Ser: 0.42 mg/dL — ABNORMAL LOW (ref 0.44–1.00)
GFR, Estimated: >60 mL/min (ref >60)
Glucose, Bld: 126 mg/dL — ABNORMAL HIGH (ref 70–99)
Potassium: 4.2 mmol/L (ref 3.5–5.1)
Sodium: 139 mmol/L (ref 135–145)
Total Bilirubin: 0.6 mg/dL (ref 0.3–1.2)
Total Protein: 6.1 g/dL — ABNORMAL LOW (ref 6.5–8.1)

## 2020-10-07 LAB — C-REACTIVE PROTEIN: CRP: 0.8 mg/dL (ref <1.0)

## 2020-10-07 LAB — FERRITIN: Ferritin: 277 ng/mL (ref 11–307)

## 2020-10-07 LAB — MAGNESIUM: Magnesium: 1.8 mg/dL (ref 1.7–2.4)

## 2020-10-07 LAB — FOLATE: Folate: 22.4 ng/mL (ref >5.9)

## 2020-10-07 LAB — D-DIMER, QUANTITATIVE: D-Dimer, Quant: 0.41 ug/mL-FEU (ref 0.00–0.50)

## 2020-10-07 LAB — TSH: TSH: 0.231 u[IU]/mL — ABNORMAL LOW (ref 0.350–4.500)

## 2020-10-07 LAB — VITAMIN B12: Vitamin B-12: 309 pg/mL (ref 180–914)

## 2020-10-07 LAB — PHOSPHORUS: Phosphorus: 3.5 mg/dL (ref 2.5–4.6)

## 2020-10-07 MED ORDER — POLYVINYL ALCOHOL 1.4 % OP SOLN
2.0000 [drp] | Freq: Every day | OPHTHALMIC | Status: DC | PRN
  Administered 2020-10-07: 2 [drp] via OPHTHALMIC
  Filled 2020-10-07: qty 15

## 2020-10-07 MED ORDER — METOPROLOL TARTRATE 25 MG PO TABS
25.0000 mg | ORAL_TABLET | Freq: Two times a day (BID) | ORAL | Status: DC
  Administered 2020-10-07: 25 mg via ORAL
  Filled 2020-10-07 (×2): qty 1

## 2020-10-07 NOTE — Progress Notes (Signed)
Assisted to bedside commode earlier, voided and had bm.

## 2020-10-07 NOTE — Progress Notes (Signed)
Unsustained SVT HR 157 now 105. Pt asymptomatic in bed.

## 2020-10-07 NOTE — Discharge Summary (Signed)
Physician Discharge Summary  Debbie [name] QPY:[id number] DOB: 1945-11-07 DOA: 10/06/2020  PCP: Celene Squibb, MD  Admit date: 10/06/2020 Discharge date: 10/09/2020  Admitted From: Home Disposition:  Home   Recommendations for Outpatient Follow-up:  1. Follow up with PCP in 1-2 weeks 2. Please obtain BMP/CBC in one week 3. Outpatient referral made to endocrinology, Dr. Jonny Ruiz with patient   Home Health: patient refuses Equipment/Devices: 2L  Discharge Condition: Stable CODE STATUS: FULL Diet recommendation: Heart Healthy    Brief/Interim Summary: 75 y.o.femalewith medical history ofCOPD, hypertension, diverticulitis, GERD, coronary artery disease, left thumb nailbeSCCpresenting with 1 week history of worsening generalized weakness. Unfortunately, the patient is a difficult historian. History is supplemented by the patient's daughter and review of medical record. The patient's daughter who is also a little bit difficult historian. Nevertheless, it appears that the patient has had decreased oral intake and increasing generalized weakness over the past week. The patient normally lives by herself and is able to perform all activities of daily living. However, the patient called her daughter to come over because she was having difficulty getting out of bed and preparing her breakfast on the morning of 10/06/2020 because of generalized weakness. The patient normally is on 2 L nasal cannula which she only uses intermittently. The patient has had touse her oxygen more frequently this past week. She thinks she may be more short of breath but cannot clarify. She has had nonproductive cough but denies any chest pain, vomiting, headache, neck pain. She feels that her throat is "broke". She denies any dysuria or hematuria. She states that she has had abdominal pain for the better part of at least a year. After by multiple rounds of questioning, it appears that the patient  has had loose stools this past week with increasing abdominal pain. There is no hematochezia or melena. In the emergency department, patient had low-grade temperature 99.2 F. She was tachycardic up to 124. Blood pressures were soft with systolic blood pressure in the 110-120. Oxygen saturation was 99% on 2 L. BMP showed a potassium 2.8, sodium 136, serum creatinine 0.52. LFTs were unremarkable. Lipase 64. WBC 5.2, hemoglobin 14.9, platelets 226,000. EKG shows sinus rhythm with no ST ST wave changes. Because the patient's generalized weakness and inability to care for herself with failure to thrive, admission was requested.  Discharge Diagnoses:   Acute on chronic respiratory failure with hypoxia secondary COVID-19 -pt has 2L at home--suppose to use it 24/7, but she does not -Personally reviewed chest x-ray--chronic interstitial changes without consolidation;bibasilar scarring -Currently stable 2 L nasal cannula -ContinueIV Solu-Medrol>>home with prednisone x 6 more days -CRP 0.9>>0.8>>0.5>>0.5 -Ferritin 314>>277>>271>>288 -D-dimer--0.41>>0.41>>0.35>>5.33 -PCT <0.10 -10/09/20--CTA chest--neg for PE; stable RLL nodules  COVID-19 gastroenteritis -Manifested by nausea, abdominal pain, and loose stools -only 2 stools since admission -Check stool pathogen panel--neg -Check C. Difficile--neg  SVT -patient having  frequent episodes of SVT, partly due to acute medical condition and hyperthyroidism -increase metoprolol succinate to 125 mg daily -Echo EF 55-60%, no WMA, normal RV  Generalized weakness/failure toThrive -PT evaluation-->HHPT which pt refuses -Primarily being driven by the patient's COVID-19 infection -continueIV fluids -UA neg for pyuria -B12--309 -folate--22.4 -TSH 0.231  Hyperthyroidism -TSH--0.231 -Free T4--1.30 -check thyroid stimulating immunoglobulin -check Thyroperoxidase--neg -anti-Tg--pending at time of d/c -outpatient referral to endocrine;   Holding off on methimazole for now  Essential hypertension -Holding amlodipine secondary to soft blood pressure -Restart metoprolol   COPD -Patient quit smoking 19 years ago after25 pk year hx -Continue  bronchodilators  Diverticulosis -10/06/2020 CT abdomen--sigmoid diverticula with mild wall thickening in the area likely representing hypertrophy from chronic diverticulosis -DoubtPatient has diverticulitis as the patient has normal white blood cell count and no abdominal pain on examination -Monitor clinically -Stool studies as discussed above  Hypokalemia -repleted -add Kcl to IVF -check mag--1.8    Discharge Instructions   Allergies as of 10/09/2020   No Known Allergies     Medication List    STOP taking these medications   amLODipine 5 MG tablet Commonly known as: NORVASC     TAKE these medications   albuterol 108 (90 Base) MCG/ACT inhaler Commonly known as: VENTOLIN HFA Inhale 1 puff into the lungs every 6 (six) hours as needed for wheezing or shortness of breath.   dicyclomine 10 MG capsule Commonly known as: BENTYL Take 1 capsule (10 mg total) by mouth 3 (three) times daily as needed for spasms (pain).   gabapentin 100 MG capsule Commonly known as: NEURONTIN Take 100 mg by mouth 3 (three) times daily as needed.   ipratropium-albuterol 0.5-2.5 (3) MG/3ML Soln Commonly known as: DUONEB Take 3 mLs by nebulization 2 (two) times a day.   LORazepam 0.5 MG tablet Commonly known as: ATIVAN Take 0.25-0.5 mg by mouth at bedtime as needed.   metoprolol succinate 100 MG 24 hr tablet Commonly known as: TOPROL-XL Take 100 mg by mouth daily. Take with or immediately following a meal. What changed: Another medication with the same name was added. Make sure you understand how and when to take each.   metoprolol succinate 25 MG 24 hr tablet Commonly known as: TOPROL-XL Take 1 tablet (25 mg total) by mouth daily. Take with metoprolol 100 mg once daily to make  total of 125 mg Start taking on: October 10, 2020 What changed: You were already taking a medication with the same name, and this prescription was added. Make sure you understand how and when to take each.   OXYGEN Inhale 2 L into the lungs daily.   pantoprazole 40 MG tablet Commonly known as: PROTONIX Take 40 mg by mouth daily.   predniSONE 50 MG tablet Commonly known as: DELTASONE Take 1 tablet (50 mg total) by mouth daily.   Propylene Glycol 0.6 % Soln Place 2 drops into both eyes daily as needed (for dry eyes).   Symbicort 160-4.5 MCG/ACT inhaler Generic drug: budesonide-formoterol Inhale 2 puffs into the lungs 2 (two) times daily.   VISION-VITE PRESERVE PO Take by mouth daily.       Follow-up [facility] Follow up.   Why:   [facility] Contact information: [place] [address] [phone]              No Known Allergies  Consultations:  none   Procedures/Studies: CT ANGIO CHEST PE W OR WO CONTRAST  Result Date: 10/09/2020 CLINICAL DATA:  Pulmonary embolus suspected. Low/intermediate probability. Positive D-dimer. Positive COVID. Shortness of breath. EXAM: CT ANGIOGRAPHY CHEST WITH CONTRAST TECHNIQUE: Multidetector CT imaging of the chest was performed using the standard protocol during bolus administration of intravenous contrast. Multiplanar CT image reconstructions and MIPs were obtained to evaluate the vascular anatomy. CONTRAST:  23mL OMNIPAQUE IOHEXOL 350 MG/ML SOLN COMPARISON:  02/04/2020 FINDINGS: Cardiovascular: The main pulmonary artery appears patent. No lobar or segmental pulmonary artery filling defects. Heart size is normal. No pericardial effusion. Mild aortic atherosclerosis and coronary artery calcifications. Mediastinum/Nodes: No enlarged mediastinal, hilar, or axillary lymph  nodes. Thyroid gland, trachea, and esophagus demonstrate no significant findings. Lungs/Pleura: Moderate  centrilobular and paraseptal emphysema. Chronic atelectasis with volume loss noted within the lingula, image 89/7. Unchanged from 02/04/2020. Bandlike area of scarring involving the right lower lobe and right middle lobe is also unchanged from previous exam. Right lower lobe lung nodule measures 4 mm, image 87/7. Unchanged. Subpleural nodule along the lateral right lower lobe measures 0.9 cm, image 101/7. Also unchanged. No new lung nodules. Upper Abdomen: No acute abnormality within the imaged portions of the upper abdomen. Aortic atherosclerosis noted. Musculoskeletal: No acute abnormality. Thoracic kyphosis. Remote healed deformity of the proximal body of sternum. Review of the MIP images confirms the above findings. IMPRESSION: 1. No evidence for acute pulmonary embolus. 2. Aortic Atherosclerosis (ICD10-I70.0) and Emphysema (ICD10-J43.9). Coronary artery calcifications. 3. Right lower lobe lung nodules are stable when compared with 01/14/2019 and most likely represent a benign process. Aortic Atherosclerosis (ICD10-I70.0) and Emphysema (ICD10-J43.9). Electronically Signed   By: Kerby Moors M.D.   On: 10/09/2020 12:09   CT ABDOMEN PELVIS W CONTRAST  Result Date: 10/06/2020 CLINICAL DATA:  Lower abdominal pain EXAM: CT ABDOMEN AND PELVIS WITH CONTRAST TECHNIQUE: Multidetector CT imaging of the abdomen and pelvis was performed using the standard protocol following bolus administration of intravenous contrast. CONTRAST:  69mL OMNIPAQUE IOHEXOL 300 MG/ML  SOLN COMPARISON:  February 04, 2020 CT abdomen and pelvis; May 10/14/2018 CT abdomen and pelvis. FINDINGS: Lower chest: There is scarring in the lung bases. There is a stable nodular opacity in the periphery of the lateral segment of the right lower lobe measuring 9 x 6 mm. This nodular opacity has been stable compared to prior studies. Hepatobiliary: There is a stable subcentimeter focus of decreased attenuation in the right lobe of the liver inferiorly, a  likely tiny cyst. There is evidence of fatty infiltration near the fissure for the ligamentum teres, stable. No new liver lesions are evident. Gallbladder is absent. There is no appreciable biliary duct dilatation. Pancreas: There is no pancreatic mass or inflammatory focus. Spleen: No splenic lesions are evident. Adrenals/Urinary Tract: Adrenals bilaterally appear normal. There is a stable lobulated predominantly cystic mass containing foci of calcification in the lateral aspect of the right kidney measuring 3.8 x 3.5 cm. No new renal mass evident. There is an extrarenal pelvis on each side, an anatomic variant. There is no evident renal or ureteral calculus on either side. Urinary bladder is midline with wall thickness within normal limits. Stomach/Bowel: There are mid to distal sigmoid diverticula without diverticular inflammation. Mild wall thickening in this area is stable and likely reflects muscular hypertrophy from chronic diverticulosis. There is no surrounding mesenteric thickening or fluid in this area. No bowel wall thickening is seen elsewhere. The terminal ileum appears normal. There is stable fatty infiltration in the ileocecal valve. There is no evident bowel obstruction. There is no appreciable free air or portal venous air. Vascular/Lymphatic: There is no abdominal aortic aneurysm. There is aortic and iliac artery atherosclerosis. Major venous structures appear patent. There is no evident adenopathy in the abdomen or pelvis. Reproductive: The uterus is anteverted. There is no evident adnexal masses. Other: The appendix appears normal. There is no evident abscess or ascites in the abdomen or pelvis. There is fat in the right inguinal ring, stable. Musculoskeletal: There is stable anterior wedging of the L5 vertebral body. There are no blastic or lytic bone lesions. There is a degree of spinal stenosis at L4-5 due to bony hypertrophy and disc  protrusion. No intramuscular lesions are demonstrable.  IMPRESSION: 1. There are stable sigmoid diverticula with mild wall thickening in this area. This wall thickening likely represents muscular hypertrophy from chronic diverticulosis. Earliest changes of diverticulitis could present in this manner, although there are no irregular diverticula or mesenteric thickening in this area. No surrounding fluid in this area. 2. No bowel obstruction. No abscess in the abdomen or pelvis. Appendix appears normal. 3. Stable lobulated predominantly cystic mass arising from the lateral right kidney containing stable foci of calcification. Stability of this lesion over time suggests benign etiology. No renal or ureteral calculus evident. No hydronephrosis. Urinary bladder wall thickness normal. 4. Nodular opacity arising from the lateral segment of the right lower lobe measuring 9 x 6 mm appears stable compared to prior studies. No new parenchymal lung lesion in the bases. 5. There is a degree of spinal stenosis at L4-5 due to bony hypertrophy and disc protrusion. Stable anterior wedging of the L5 vertebral body. 6.  Aortic Atherosclerosis (ICD10-I70.0). 7.  Gallbladder absent. Electronically Signed   By: Lowella Grip III M.D.   On: 10/06/2020 13:29   DG Chest Portable 1 View  Result Date: 10/06/2020 CLINICAL DATA:  Shortness of breath.  Abdominal pain.  COPD. EXAM: PORTABLE CHEST 1 VIEW COMPARISON:  Chest CT 02/04/2020 and chest radiograph 01/18/2019 FINDINGS: Severe emphysema. Interstitial accentuation at the lung bases. 1.8 by 1.0 cm density at the left lung base probably from atelectasis or scarring given the similar appearance on prior chest CT. Peripheral pulmonary nodule in the right lower lobe measuring about 0.9 cm in diameter, similar to prior CT of 02/04/2020. Atherosclerotic calcification of the aortic arch. IMPRESSION: 1. Severe emphysema with chronic interstitial accentuation at the lung bases. 2. Stable pulmonary nodule in the right lower lobe. 3. Stable left  basilar scarring. Electronically Signed   By: Van Clines M.D.   On: 10/06/2020 11:51   ECHOCARDIOGRAM COMPLETE  Result Date: 10/08/2020    ECHOCARDIOGRAM REPORT   Patient Name:   Debbie [name] Date of Exam: 10/08/2020 Medical Rec #:  [id number]     Height:       61.0 in Accession #:    [id number]    Weight:       111.7 lb Date of Birth:  10/20/1945      BSA:          1.475 m Patient Age:    61 years      BP:           131/75 mmHg Patient Gender: F             HR:           88 bpm. Exam Location:  [facility] Procedure: 2D Echo Indications:    SVT (supraventricular tachycardia  History:        Patient has prior history of Echocardiogram examinations, most                 recent 11/23/2015. COPD; Risk Factors:Hypertension and Former                 Smoker. Pneumonia due to COVID-19 virus, Shingles.  Sonographer:    Leavy Cella RDCS (AE) Referring Phys: [phone] Izea Livolsi IMPRESSIONS  1. Left ventricular ejection fraction, by estimation, is 55 to 60%. The left ventricle has normal function. The left ventricle has no regional wall motion abnormalities. Left ventricular diastolic parameters were normal.  2. Right ventricular systolic function is normal. The right  ventricular size is normal.  3. Prominant chiari network benign.  4. The mitral valve is normal in structure. No evidence of mitral valve regurgitation. No evidence of mitral stenosis.  5. The aortic valve is tricuspid. Aortic valve regurgitation is not visualized. Mild to moderate aortic valve sclerosis/calcification is present, without any evidence of aortic stenosis.  6. The inferior vena cava is normal in size with greater than 50% respiratory variability, suggesting right atrial pressure of 3 mmHg. FINDINGS  Left Ventricle: Left ventricular ejection fraction, by estimation, is 55 to 60%. The left ventricle has normal function. The left ventricle has no regional wall motion abnormalities. The left ventricular internal cavity size was normal in size.  There is  no left ventricular hypertrophy. Left ventricular diastolic parameters were normal. Right Ventricle: The right ventricular size is normal. No increase in right ventricular wall thickness. Right ventricular systolic function is normal. Left Atrium: Left atrial size was normal in size. Right Atrium: Right atrial size was normal in size. Pericardium: There is no evidence of pericardial effusion. Mitral Valve: The mitral valve is normal in structure. No evidence of mitral valve regurgitation. No evidence of mitral valve stenosis. Tricuspid Valve: The tricuspid valve is normal in structure. Tricuspid valve regurgitation is trivial. No evidence of tricuspid stenosis. Aortic Valve: The aortic valve is tricuspid. Aortic valve regurgitation is not visualized. Mild to moderate aortic valve sclerosis/calcification is present, without any evidence of aortic stenosis. Pulmonic Valve: The pulmonic valve was normal in structure. Pulmonic valve regurgitation is not visualized. No evidence of pulmonic stenosis. Aorta: The aortic root is normal in size and structure. Venous: The inferior vena cava is normal in size with greater than 50% respiratory variability, suggesting right atrial pressure of 3 mmHg. IAS/Shunts: No atrial level shunt detected by color flow Doppler.  LEFT VENTRICLE PLAX 2D LVIDd:         4.37 cm  Diastology LVIDs:         3.08 cm  LV e' medial:    5.87 cm/s LV PW:         1.09 cm  LV E/e' medial:  13.6 LV IVS:        0.99 cm  LV e' lateral:   5.77 cm/s LVOT diam:     1.90 cm  LV E/e' lateral: 13.9 LVOT Area:     2.84 cm  LEFT ATRIUM             Index       RIGHT ATRIUM          Index LA diam:        3.30 cm 2.24 cm/m  RA Area:     8.60 cm LA Vol (A2C):   17.0 ml 11.53 ml/m RA Volume:   17.60 ml 11.94 ml/m LA Vol (A4C):   17.9 ml 12.14 ml/m LA Biplane Vol: 18.0 ml 12.21 ml/m   AORTA Ao Root diam: 2.60 cm MITRAL VALVE MV Area (PHT): 4.29 cm    SHUNTS MV Decel Time: 177 msec    Systemic Diam: 1.90  cm MV E velocity: 80.10 cm/s MV A velocity: 93.80 cm/s MV E/A ratio:  0.85 Jenkins Rouge MD Electronically signed by Jenkins Rouge MD Signature Date/Time: 10/08/2020/11:32:43 AM    Final         Discharge Exam: Vitals:   10/09/20 0732 10/09/20 0746  BP:  120/70  Pulse:  76  Resp:    Temp:  98 F (36.7 C)  SpO2: 94% 96%  Vitals:   10/08/20 2020 10/09/20 0624 10/09/20 0732 10/09/20 0746  BP:  139/69  120/70  Pulse:  78  76  Resp:  20    Temp:  98.1 F (36.7 C)  98 F (36.7 C)  TempSrc:  Oral  Oral  SpO2: 94% 94% 94% 96%  Weight:      Height:        General: Pt is alert, awake, not in acute distress Cardiovascular: RRR, S1/S2 +, no rubs, no gallops Respiratory: bibasilar crackles. No wheeze Abdominal: Soft, NT, ND, bowel sounds + Extremities: no edema, no cyanosis   The results of significant diagnostics from this hospitalization (including imaging, microbiology, ancillary and laboratory) are listed below for reference.    Significant Diagnostic Studies: CT ANGIO CHEST PE W OR WO CONTRAST  Result Date: 10/09/2020 CLINICAL DATA:  Pulmonary embolus suspected. Low/intermediate probability. Positive D-dimer. Positive COVID. Shortness of breath. EXAM: CT ANGIOGRAPHY CHEST WITH CONTRAST TECHNIQUE: Multidetector CT imaging of the chest was performed using the standard protocol during bolus administration of intravenous contrast. Multiplanar CT image reconstructions and MIPs were obtained to evaluate the vascular anatomy. CONTRAST:  38mL OMNIPAQUE IOHEXOL 350 MG/ML SOLN COMPARISON:  02/04/2020 FINDINGS: Cardiovascular: The main pulmonary artery appears patent. No lobar or segmental pulmonary artery filling defects. Heart size is normal. No pericardial effusion. Mild aortic atherosclerosis and coronary artery calcifications. Mediastinum/Nodes: No enlarged mediastinal, hilar, or axillary lymph nodes. Thyroid gland, trachea, and esophagus demonstrate no significant findings. Lungs/Pleura:  Moderate centrilobular and paraseptal emphysema. Chronic atelectasis with volume loss noted within the lingula, image 89/7. Unchanged from 02/04/2020. Bandlike area of scarring involving the right lower lobe and right middle lobe is also unchanged from previous exam. Right lower lobe lung nodule measures 4 mm, image 87/7. Unchanged. Subpleural nodule along the lateral right lower lobe measures 0.9 cm, image 101/7. Also unchanged. No new lung nodules. Upper Abdomen: No acute abnormality within the imaged portions of the upper abdomen. Aortic atherosclerosis noted. Musculoskeletal: No acute abnormality. Thoracic kyphosis. Remote healed deformity of the proximal body of sternum. Review of the MIP images confirms the above findings. IMPRESSION: 1. No evidence for acute pulmonary embolus. 2. Aortic Atherosclerosis (ICD10-I70.0) and Emphysema (ICD10-J43.9). Coronary artery calcifications. 3. Right lower lobe lung nodules are stable when compared with 01/14/2019 and most likely represent a benign process. Aortic Atherosclerosis (ICD10-I70.0) and Emphysema (ICD10-J43.9). Electronically Signed   By: Kerby Moors M.D.   On: 10/09/2020 12:09   CT ABDOMEN PELVIS W CONTRAST  Result Date: 10/06/2020 CLINICAL DATA:  Lower abdominal pain EXAM: CT ABDOMEN AND PELVIS WITH CONTRAST TECHNIQUE: Multidetector CT imaging of the abdomen and pelvis was performed using the standard protocol following bolus administration of intravenous contrast. CONTRAST:  63mL OMNIPAQUE IOHEXOL 300 MG/ML  SOLN COMPARISON:  February 04, 2020 CT abdomen and pelvis; May 10/14/2018 CT abdomen and pelvis. FINDINGS: Lower chest: There is scarring in the lung bases. There is a stable nodular opacity in the periphery of the lateral segment of the right lower lobe measuring 9 x 6 mm. This nodular opacity has been stable compared to prior studies. Hepatobiliary: There is a stable subcentimeter focus of decreased attenuation in the right lobe of the liver  inferiorly, a likely tiny cyst. There is evidence of fatty infiltration near the fissure for the ligamentum teres, stable. No new liver lesions are evident. Gallbladder is absent. There is no appreciable biliary duct dilatation. Pancreas: There is no pancreatic mass or inflammatory focus. Spleen: No splenic lesions are evident.  Adrenals/Urinary Tract: Adrenals bilaterally appear normal. There is a stable lobulated predominantly cystic mass containing foci of calcification in the lateral aspect of the right kidney measuring 3.8 x 3.5 cm. No new renal mass evident. There is an extrarenal pelvis on each side, an anatomic variant. There is no evident renal or ureteral calculus on either side. Urinary bladder is midline with wall thickness within normal limits. Stomach/Bowel: There are mid to distal sigmoid diverticula without diverticular inflammation. Mild wall thickening in this area is stable and likely reflects muscular hypertrophy from chronic diverticulosis. There is no surrounding mesenteric thickening or fluid in this area. No bowel wall thickening is seen elsewhere. The terminal ileum appears normal. There is stable fatty infiltration in the ileocecal valve. There is no evident bowel obstruction. There is no appreciable free air or portal venous air. Vascular/Lymphatic: There is no abdominal aortic aneurysm. There is aortic and iliac artery atherosclerosis. Major venous structures appear patent. There is no evident adenopathy in the abdomen or pelvis. Reproductive: The uterus is anteverted. There is no evident adnexal masses. Other: The appendix appears normal. There is no evident abscess or ascites in the abdomen or pelvis. There is fat in the right inguinal ring, stable. Musculoskeletal: There is stable anterior wedging of the L5 vertebral body. There are no blastic or lytic bone lesions. There is a degree of spinal stenosis at L4-5 due to bony hypertrophy and disc protrusion. No intramuscular lesions are  demonstrable. IMPRESSION: 1. There are stable sigmoid diverticula with mild wall thickening in this area. This wall thickening likely represents muscular hypertrophy from chronic diverticulosis. Earliest changes of diverticulitis could present in this manner, although there are no irregular diverticula or mesenteric thickening in this area. No surrounding fluid in this area. 2. No bowel obstruction. No abscess in the abdomen or pelvis. Appendix appears normal. 3. Stable lobulated predominantly cystic mass arising from the lateral right kidney containing stable foci of calcification. Stability of this lesion over time suggests benign etiology. No renal or ureteral calculus evident. No hydronephrosis. Urinary bladder wall thickness normal. 4. Nodular opacity arising from the lateral segment of the right lower lobe measuring 9 x 6 mm appears stable compared to prior studies. No new parenchymal lung lesion in the bases. 5. There is a degree of spinal stenosis at L4-5 due to bony hypertrophy and disc protrusion. Stable anterior wedging of the L5 vertebral body. 6.  Aortic Atherosclerosis (ICD10-I70.0). 7.  Gallbladder absent. Electronically Signed   By: Lowella Grip III M.D.   On: 10/06/2020 13:29   DG Chest Portable 1 View  Result Date: 10/06/2020 CLINICAL DATA:  Shortness of breath.  Abdominal pain.  COPD. EXAM: PORTABLE CHEST 1 VIEW COMPARISON:  Chest CT 02/04/2020 and chest radiograph 01/18/2019 FINDINGS: Severe emphysema. Interstitial accentuation at the lung bases. 1.8 by 1.0 cm density at the left lung base probably from atelectasis or scarring given the similar appearance on prior chest CT. Peripheral pulmonary nodule in the right lower lobe measuring about 0.9 cm in diameter, similar to prior CT of 02/04/2020. Atherosclerotic calcification of the aortic arch. IMPRESSION: 1. Severe emphysema with chronic interstitial accentuation at the lung bases. 2. Stable pulmonary nodule in the right lower lobe. 3.  Stable left basilar scarring. Electronically Signed   By: Van Clines M.D.   On: 10/06/2020 11:51   ECHOCARDIOGRAM COMPLETE  Result Date: 10/08/2020    ECHOCARDIOGRAM REPORT   Patient Name:   DENICA WEB Date of Exam: 10/08/2020 Medical Rec #:  [id number]     Height:       61.0 in Accession #:    [id number]    Weight:       111.7 lb Date of Birth:  Sep 19, 1945      BSA:          1.475 m Patient Age:    35 years      BP:           131/75 mmHg Patient Gender: F             HR:           88 bpm. Exam Location:  [facility] Procedure: 2D Echo Indications:    SVT (supraventricular tachycardia  History:        Patient has prior history of Echocardiogram examinations, most                 recent 11/23/2015. COPD; Risk Factors:Hypertension and Former                 Smoker. Pneumonia due to COVID-19 virus, Shingles.  Sonographer:    Leavy Cella RDCS (AE) Referring Phys: [phone] Talasia Saulter IMPRESSIONS  1. Left ventricular ejection fraction, by estimation, is 55 to 60%. The left ventricle has normal function. The left ventricle has no regional wall motion abnormalities. Left ventricular diastolic parameters were normal.  2. Right ventricular systolic function is normal. The right ventricular size is normal.  3. Prominant chiari network benign.  4. The mitral valve is normal in structure. No evidence of mitral valve regurgitation. No evidence of mitral stenosis.  5. The aortic valve is tricuspid. Aortic valve regurgitation is not visualized. Mild to moderate aortic valve sclerosis/calcification is present, without any evidence of aortic stenosis.  6. The inferior vena cava is normal in size with greater than 50% respiratory variability, suggesting right atrial pressure of 3 mmHg. FINDINGS  Left Ventricle: Left ventricular ejection fraction, by estimation, is 55 to 60%. The left ventricle has normal function. The left ventricle has no regional wall motion abnormalities. The left ventricular internal cavity size was  normal in size. There is  no left ventricular hypertrophy. Left ventricular diastolic parameters were normal. Right Ventricle: The right ventricular size is normal. No increase in right ventricular wall thickness. Right ventricular systolic function is normal. Left Atrium: Left atrial size was normal in size. Right Atrium: Right atrial size was normal in size. Pericardium: There is no evidence of pericardial effusion. Mitral Valve: The mitral valve is normal in structure. No evidence of mitral valve regurgitation. No evidence of mitral valve stenosis. Tricuspid Valve: The tricuspid valve is normal in structure. Tricuspid valve regurgitation is trivial. No evidence of tricuspid stenosis. Aortic Valve: The aortic valve is tricuspid. Aortic valve regurgitation is not visualized. Mild to moderate aortic valve sclerosis/calcification is present, without any evidence of aortic stenosis. Pulmonic Valve: The pulmonic valve was normal in structure. Pulmonic valve regurgitation is not visualized. No evidence of pulmonic stenosis. Aorta: The aortic root is normal in size and structure. Venous: The inferior vena cava is normal in size with greater than 50% respiratory variability, suggesting right atrial pressure of 3 mmHg. IAS/Shunts: No atrial level shunt detected by color flow Doppler.  LEFT VENTRICLE PLAX 2D LVIDd:         4.37 cm  Diastology LVIDs:         3.08 cm  LV e' medial:    5.87 cm/s LV PW:  1.09 cm  LV E/e' medial:  13.6 LV IVS:        0.99 cm  LV e' lateral:   5.77 cm/s LVOT diam:     1.90 cm  LV E/e' lateral: 13.9 LVOT Area:     2.84 cm  LEFT ATRIUM             Index       RIGHT ATRIUM          Index LA diam:        3.30 cm 2.24 cm/m  RA Area:     8.60 cm LA Vol (A2C):   17.0 ml 11.53 ml/m RA Volume:   17.60 ml 11.94 ml/m LA Vol (A4C):   17.9 ml 12.14 ml/m LA Biplane Vol: 18.0 ml 12.21 ml/m   AORTA Ao Root diam: 2.60 cm MITRAL VALVE MV Area (PHT): 4.29 cm    SHUNTS MV Decel Time: 177 msec     Systemic Diam: 1.90 cm MV E velocity: 80.10 cm/s MV A velocity: 93.80 cm/s MV E/A ratio:  0.85 Jenkins Rouge MD Electronically signed by Jenkins Rouge MD Signature Date/Time: 10/08/2020/11:32:43 AM    Final      Microbiology: Recent Results (from the past 240 hour(s))  Blood culture (routine x 2)     Status: None (Preliminary result)   Collection Time: 10/06/20 10:56 AM   Specimen: BLOOD  Result Value Ref Range Status   Specimen Description BLOOD BLOOD RIGHT HAND  Final   Special Requests   Final    BOTTLES DRAWN AEROBIC AND ANAEROBIC Blood Culture adequate volume   Culture   Final    NO GROWTH 3 DAYS Performed at [facility], [address] [place], [place] [address]    Report Status PENDING  Incomplete  Blood culture (routine x 2)     Status: None (Preliminary result)   Collection Time: 10/06/20 10:56 AM   Specimen: BLOOD  Result Value Ref Range Status   Specimen Description BLOOD BLOOD LEFT HAND  Final   Special Requests   Final    BOTTLES DRAWN AEROBIC AND ANAEROBIC Blood Culture adequate volume   Culture   Final    NO GROWTH 3 DAYS Performed at [facility], [address] [place], [place] [address]    Report Status PENDING  Incomplete  Resp Panel by RT-PCR (Flu A&B, Covid) Nasopharyngeal Swab     Status: Abnormal   Collection Time: 10/06/20 10:56 AM   Specimen: Nasopharyngeal Swab; Nasopharyngeal(NP) swabs in vial transport medium  Result Value Ref Range Status   SARS Coronavirus 2 by RT PCR POSITIVE (A) NEGATIVE Final    Comment: RESULT CALLED TO, READ BACK BY AND VERIFIED WITH: MCMILLIAN B. @ [id number] ON [id number] BY HENDERSON L. (NOTE) SARS-CoV-2 target nucleic acids are DETECTED.  The SARS-CoV-2 RNA is generally detectable in upper respiratory specimens during the acute phase of infection. Positive results are indicative of the presence of the identified virus, but do not rule out bacterial infection or co-infection with other pathogens not detected by the test. Clinical  correlation with patient history and other diagnostic information is necessary to determine patient infection status. The expected result is Negative.  Fact Sheet for Patients: EntrepreneurPulse.com.au  Fact Sheet for Healthcare Providers: IncredibleEmployment.be  This test is not yet approved or cleared by the [place] FDA and  has been authorized for detection and/or diagnosis of SARS-CoV-2 by FDA under an Emergency Use Authorization (EUA).  This EUA will remain in effect (meaning  this  test can be used) for the duration of  the COVID-19 declaration under Section 564(b)(1) of the Act, 21 U.S.C. section 360bbb-3(b)(1), unless the authorization is terminated or revoked sooner.     Influenza A by PCR NEGATIVE NEGATIVE Final   Influenza B by PCR NEGATIVE NEGATIVE Final    Comment: (NOTE) The Xpert Xpress SARS-CoV-2/FLU/RSV plus assay is intended as an aid in the diagnosis of influenza from Nasopharyngeal swab specimens and should not be used as a sole basis for treatment. Nasal washings and aspirates are unacceptable for Xpert Xpress SARS-CoV-2/FLU/RSV testing.  Fact Sheet for Patients: EntrepreneurPulse.com.au  Fact Sheet for Healthcare Providers: IncredibleEmployment.be  This test is not yet approved or cleared by the [place] FDA and has been authorized for detection and/or diagnosis of SARS-CoV-2 by FDA under an Emergency Use Authorization (EUA). This EUA will remain in effect (meaning this test can be used) for the duration of the COVID-19 declaration under Section 564(b)(1) of the Act, 21 U.S.C. section 360bbb-3(b)(1), unless the authorization is terminated or revoked.  Performed at [facility], [address] [place], [place] [address]   Gastrointestinal Panel by PCR , Stool     Status: None   Collection Time: 10/06/20  6:14 PM   Specimen: Stool  Result Value Ref Range Status    Campylobacter species NOT DETECTED NOT DETECTED Final   Plesimonas shigelloides NOT DETECTED NOT DETECTED Final   Salmonella species NOT DETECTED NOT DETECTED Final   Yersinia enterocolitica NOT DETECTED NOT DETECTED Final   Vibrio species NOT DETECTED NOT DETECTED Final   Vibrio cholerae NOT DETECTED NOT DETECTED Final   Enteroaggregative E coli (EAEC) NOT DETECTED NOT DETECTED Final   Enteropathogenic E coli (EPEC) NOT DETECTED NOT DETECTED Final   Enterotoxigenic E coli (ETEC) NOT DETECTED NOT DETECTED Final   Shiga like toxin producing E coli (STEC) NOT DETECTED NOT DETECTED Final   Shigella/Enteroinvasive E coli (EIEC) NOT DETECTED NOT DETECTED Final   Cryptosporidium NOT DETECTED NOT DETECTED Final   Cyclospora cayetanensis NOT DETECTED NOT DETECTED Final   Entamoeba histolytica NOT DETECTED NOT DETECTED Final   Giardia lamblia NOT DETECTED NOT DETECTED Final   Adenovirus F40/41 NOT DETECTED NOT DETECTED Final   Astrovirus NOT DETECTED NOT DETECTED Final   Norovirus GI/GII NOT DETECTED NOT DETECTED Final   Rotavirus A NOT DETECTED NOT DETECTED Final   Sapovirus (I, II, IV, and V) NOT DETECTED NOT DETECTED Final    Comment: Performed at [facility], [address] [place], [place] [address]  C Difficile Quick Screen w PCR reflex     Status: None   Collection Time: 10/06/20  6:14 PM   Specimen: Stool  Result Value Ref Range Status   C Diff antigen NEGATIVE NEGATIVE Final   C Diff toxin NEGATIVE NEGATIVE Final   C Diff interpretation No C. difficile detected.  Final    Comment: Performed at [facility], [address] [place], [place] [address]     Labs: Basic Metabolic Panel: Recent Labs  Lab 10/06/20 1056 10/07/20 0607 10/08/20 0632 10/09/20 0516  NA 136 139 139 141  K 2.8* 4.2 4.5 4.2  CL 97* 103 103 107  CO2 28 26 28 23   GLUCOSE 117* 126* 115* 157*  BUN 8 5* 10 16  CREATININE 0.52 0.42* 0.44 0.45  CALCIUM 8.6* 8.8* 8.9 9.2  MG 2.0 1.8 2.1 2.2  PHOS   --  3.5 3.5 3.6   Liver Function Tests: Recent Labs  Lab 10/06/20 1056 10/07/20 0607 10/08/20 0632 10/09/20 0516  AST 21 17 28  57*  ALT 16 14 26  62*  ALKPHOS 57 50 48 54  BILITOT 0.7 0.6 0.7 0.6  PROT 7.1 6.1* 6.2* 6.5  ALBUMIN 3.8 3.2* 3.3* 3.4*   Recent Labs  Lab 10/06/20 1056  LIPASE 64*   No results for input(s): AMMONIA in the last 168 hours. CBC: Recent Labs  Lab 10/06/20 1056 10/07/20 0607 10/08/20 0632 10/09/20 0516  WBC 5.2 3.2* 6.9 7.2  NEUTROABS 2.9 1.9 3.8 5.1  HGB 14.9 13.8 13.7 13.9  HCT 43.4 41.7 41.7 43.2  MCV 90.8 92.9 92.9 96.0  PLT 226 213 255 206   Cardiac Enzymes: No results for input(s): CKTOTAL, CKMB, CKMBINDEX, TROPONINI in the last 168 hours. BNP: Invalid input(s): POCBNP CBG: No results for input(s): GLUCAP in the last 168 hours.  Time coordinating discharge:  36 minutes  Signed:  Orson Eva, DO [facility] Pager: [phone] 10/09/2020, 12:32 PM

## 2020-10-07 NOTE — Progress Notes (Signed)
Up to bedside commode with just standby assist.   Voided but no bm.  Using incentive and flutter

## 2020-10-07 NOTE — Progress Notes (Signed)
Tele called pulse up to 147, back to 100 and the up to 170's with 17 beat run svt's.  BP  112/74 .  Dr. Carles Collet increased metoprolol from 12.5 to 25mg   bid.

## 2020-10-07 NOTE — Progress Notes (Signed)
PROGRESS NOTE  Debbie [name] NID:[id number] DOB: 21-Dec-1945 DOA: 10/06/2020 PCP: Celene Squibb, MD  Brief History:  75 y.o. female with medical history of COPD, hypertension, diverticulitis, GERD, coronary artery disease, left thumb nailbe SCC presenting with 1 week history of worsening generalized weakness.  Unfortunately, the patient is a difficult historian.  History is supplemented by the patient's daughter and review of medical record.  The patient's daughter who is also a little bit difficult historian.  Nevertheless, it appears that the patient has had decreased oral intake and increasing generalized weakness over the past week.  The patient normally lives by herself and is able to perform all activities of daily living.  However, the patient called her daughter to come over because she was having difficulty getting out of bed and preparing her breakfast on the morning of 10/06/2020 because of generalized weakness.  The patient normally is on 2 L nasal cannula which she only uses intermittently.  The patient has had to use her oxygen more frequently this past week.  She thinks she may be more short of breath but cannot clarify.  She has had nonproductive cough but denies any chest pain, vomiting, headache, neck pain.  She feels that her throat is "broke".  She denies any dysuria or hematuria. She states that she has had abdominal pain for the better part of at least a year.  After by multiple rounds of questioning, it appears that the patient has had loose stools this past week with increasing abdominal pain.  There is no hematochezia or melena. In the emergency department, patient had low-grade temperature 99.2 F.  She was tachycardic up to 124.  Blood pressures were soft with systolic blood pressure in the 110-120.  Oxygen saturation was 99% on 2 L.  BMP showed a potassium 2.8, sodium 136, serum creatinine 0.52.  LFTs were unremarkable.  Lipase 64.  WBC 5.2, hemoglobin 14.9, platelets  226,000.  EKG shows sinus rhythm with no ST ST wave changes.  Because the patient's generalized weakness and inability to care for herself with failure to thrive, admission was requested.  Assessment/Plan: Acute on chronic respiratory failure with hypoxia secondary COVID-19 -Although the patient has not had worsening oxygen desaturation, she has had some increase shortness of breath and had to use her oxygen more frequently this past week -Personally reviewed chest x-ray--chronic interstitial changes without consolidation; bibasilar scarring -Currently stable 2 L nasal cannula -Continue IV Solu-Medrol -CRP 0.9>>0.8 -Ferritin 314>>277 -D-dimer--0.41>>0.41 -PCT <0.10  COVID-19 gastroenteritis -Manifested by nausea, abdominal pain, and loose stools -only 2 stools since admission -Check stool pathogen panel--pending -Check C. Difficile--neg  Generalized weakness/failure to Thrive -PT evaluation -Primarily being driven by the patient's COVID-19 infection -continue IV fluids -UA neg for pyuria -B12--309 -folate--22.4 -TSH 0.231  Essential hypertension -Holding amlodipine secondary to soft blood pressure -Restart metoprolol at lower dose secondary to rebound tachycardia  COPD -Patient quit smoking 19 years ago after 25 pk year hx -Continue bronchodilators  Diverticulosis -10/06/2020 CT abdomen--sigmoid diverticula with mild wall thickening in the area likely representing hypertrophy from chronic diverticulosis -Doubt Patient has diverticulitis as the patient has normal white blood cell count and no abdominal pain on examination -Monitor clinically -Stool studies as discussed above  Hypokalemia -repleted -add Kcl to IVF -check mag--1.8     Status is: Inpatient  Remains inpatient appropriate because:IV treatments appropriate due to intensity of illness or inability to take PO   Dispo: The  patient is from: Home              Anticipated d/c is to: Home               Anticipated d/c date is: 1 day              Patient currently is not medically stable to d/c.   Difficult to place patient No        Family Communication:   Daughter updated 2/13  Consultants:  none  Code Status:  FULL   DVT Prophylaxis:   Lovenox   Procedures: As Listed in Progress Note Above  Antibiotics: None       Subjective: Patient is feeling a little better today.  Apetite remains poor but denies f/c, cp, sob, n/v/.  2 BMs in last 24 hours  Objective: Vitals:   10/06/20 2115 10/07/20 0138 10/07/20 0643 10/07/20 0811  BP: 115/79  116/74   Pulse: (!) 114  89   Resp: 19  20   Temp: 98.5 F (36.9 C)  98.3 F (36.8 C)   TempSrc: Oral  Oral   SpO2: 96% 95% 96% 96%  Weight:      Height:        Intake/Output Summary (Last 24 hours) at 10/07/2020 1218 Last data filed at 10/07/2020 0700 Gross per 24 hour  Intake 1900 ml  Output 450 ml  Net 1450 ml   Weight change:  Exam:   General:  Pt is alert, follows commands appropriately, not in acute distress  HEENT: No icterus, No thrush, No neck mass, [place]/AT  Cardiovascular: RRR, S1/S2, no rubs, no gallops  Respiratory: bibasilar rales. No wheeze  Abdomen: Soft/+BS, non tender, non distended, no guarding  Extremities: No edema, No lymphangitis, No petechiae, No rashes, no synovitis   Data Reviewed: I have personally reviewed following labs and imaging studies Basic Metabolic Panel: Recent Labs  Lab 10/06/20 1056 10/07/20 0607  NA 136 139  K 2.8* 4.2  CL 97* 103  CO2 28 26  GLUCOSE 117* 126*  BUN 8 5*  CREATININE 0.52 0.42*  CALCIUM 8.6* 8.8*  MG 2.0 1.8  PHOS  --  3.5   Liver Function Tests: Recent Labs  Lab 10/06/20 1056 10/07/20 0607  AST 21 17  ALT 16 14  ALKPHOS 57 50  BILITOT 0.7 0.6  PROT 7.1 6.1*  ALBUMIN 3.8 3.2*   Recent Labs  Lab 10/06/20 1056  LIPASE 64*   No results for input(s): AMMONIA in the last 168 hours. Coagulation Profile: No results for input(s): INR,  PROTIME in the last 168 hours. CBC: Recent Labs  Lab 10/06/20 1056 10/07/20 0607  WBC 5.2 3.2*  NEUTROABS 2.9 1.9  HGB 14.9 13.8  HCT 43.4 41.7  MCV 90.8 92.9  PLT 226 213   Cardiac Enzymes: No results for input(s): CKTOTAL, CKMB, CKMBINDEX, TROPONINI in the last 168 hours. BNP: Invalid input(s): POCBNP CBG: No results for input(s): GLUCAP in the last 168 hours. HbA1C: No results for input(s): HGBA1C in the last 72 hours. Urine analysis:    Component Value Date/Time   COLORURINE YELLOW 10/06/2020 1111   APPEARANCEUR CLEAR 10/06/2020 1111   LABSPEC 1.005 10/06/2020 1111   PHURINE 6.0 10/06/2020 1111   GLUCOSEU NEGATIVE 10/06/2020 1111   HGBUR NEGATIVE 10/06/2020 1111   BILIRUBINUR NEGATIVE 10/06/2020 1111   KETONESUR 5 (A) 10/06/2020 1111   PROTEINUR NEGATIVE 10/06/2020 1111   NITRITE NEGATIVE 10/06/2020 1111   LEUKOCYTESUR NEGATIVE 10/06/2020 1111  Sepsis Labs: @LABRCNTIP (procalcitonin:4,lacticidven:4) ) Recent Results (from the past 240 hour(s))  Blood culture (routine x 2)     Status: None (Preliminary result)   Collection Time: 10/06/20 10:56 AM   Specimen: BLOOD  Result Value Ref Range Status   Specimen Description BLOOD BLOOD RIGHT HAND  Final   Special Requests   Final    BOTTLES DRAWN AEROBIC AND ANAEROBIC Blood Culture adequate volume   Culture   Final    NO GROWTH < 12 HOURS Performed at [facility], [address] [place], [place] [address]    Report Status PENDING  Incomplete  Blood culture (routine x 2)     Status: None (Preliminary result)   Collection Time: 10/06/20 10:56 AM   Specimen: BLOOD  Result Value Ref Range Status   Specimen Description BLOOD BLOOD LEFT HAND  Final   Special Requests   Final    BOTTLES DRAWN AEROBIC AND ANAEROBIC Blood Culture adequate volume   Culture   Final    NO GROWTH < 12 HOURS Performed at [facility], [address], [place], [place] [address]    Report Status PENDING  Incomplete  Resp Panel by RT-PCR  (Flu A&B, Covid) Nasopharyngeal Swab     Status: Abnormal   Collection Time: 10/06/20 10:56 AM   Specimen: Nasopharyngeal Swab; Nasopharyngeal(NP) swabs in vial transport medium  Result Value Ref Range Status   SARS Coronavirus 2 by RT PCR POSITIVE (A) NEGATIVE Final    Comment: RESULT CALLED TO, READ BACK BY AND VERIFIED WITH: MCMILLIAN B. @ 1307 ON [id number] BY HENDERSON L. (NOTE) SARS-CoV-2 target nucleic acids are DETECTED.  The SARS-CoV-2 RNA is generally detectable in upper respiratory specimens during the acute phase of infection. Positive results are indicative of the presence of the identified virus, but do not rule out bacterial infection or co-infection with other pathogens not detected by the test. Clinical correlation with patient history and other diagnostic information is necessary to determine patient infection status. The expected result is Negative.  Fact Sheet for Patients: EntrepreneurPulse.com.au  Fact Sheet for Healthcare Providers: IncredibleEmployment.be  This test is not yet approved or cleared by the [place] FDA and  has been authorized for detection and/or diagnosis of SARS-CoV-2 by FDA under an Emergency Use Authorization (EUA).  This EUA will remain in effect (meaning this  test can be used) for the duration of  the COVID-19 declaration under Section 564(b)(1) of the Act, 21 U.S.C. section 360bbb-3(b)(1), unless the authorization is terminated or revoked sooner.     Influenza A by PCR NEGATIVE NEGATIVE Final   Influenza B by PCR NEGATIVE NEGATIVE Final    Comment: (NOTE) The Xpert Xpress SARS-CoV-2/FLU/RSV plus assay is intended as an aid in the diagnosis of influenza from Nasopharyngeal swab specimens and should not be used as a sole basis for treatment. Nasal washings and aspirates are unacceptable for Xpert Xpress SARS-CoV-2/FLU/RSV testing.  Fact Sheet for  Patients: EntrepreneurPulse.com.au  Fact Sheet for Healthcare Providers: IncredibleEmployment.be  This test is not yet approved or cleared by the [place] FDA and has been authorized for detection and/or diagnosis of SARS-CoV-2 by FDA under an Emergency Use Authorization (EUA). This EUA will remain in effect (meaning this test can be used) for the duration of the COVID-19 declaration under Section 564(b)(1) of the Act, 21 U.S.C. section 360bbb-3(b)(1), unless the authorization is terminated or revoked.  Performed at [facility], [address] [place], [place] [address]   C Difficile Quick Screen w PCR  reflex     Status: None   Collection Time: 10/06/20  6:14 PM   Specimen: Stool  Result Value Ref Range Status   C Diff antigen NEGATIVE NEGATIVE Final   C Diff toxin NEGATIVE NEGATIVE Final   C Diff interpretation No C. difficile detected.  Final    Comment: Performed at [facility], [address] [place], [place] [address]     Scheduled Meds: . enoxaparin (LOVENOX) injection  40 mg Subcutaneous Q24H  . fluticasone furoate-vilanterol  1 puff Inhalation Daily  . Ipratropium-Albuterol  1 puff Inhalation Q6H  . methylPREDNISolone (SOLU-MEDROL) injection  0.5 mg/kg Intravenous Q12H   Followed by  . [START ON 10/09/2020] predniSONE  50 mg Oral Daily  . metoprolol tartrate  12.5 mg Oral BID  . pantoprazole  40 mg Oral Daily   Continuous Infusions: . 0.9 % NaCl with KCl 20 mEq / L 75 mL/hr at 10/07/20 2979    Procedures/Studies: CT ABDOMEN PELVIS W CONTRAST  Result Date: 10/06/2020 CLINICAL DATA:  Lower abdominal pain EXAM: CT ABDOMEN AND PELVIS WITH CONTRAST TECHNIQUE: Multidetector CT imaging of the abdomen and pelvis was performed using the standard protocol following bolus administration of intravenous contrast. CONTRAST:  82mL OMNIPAQUE IOHEXOL 300 MG/ML  SOLN COMPARISON:  February 04, 2020 CT abdomen and pelvis; May 10/14/2018 CT abdomen  and pelvis. FINDINGS: Lower chest: There is scarring in the lung bases. There is a stable nodular opacity in the periphery of the lateral segment of the right lower lobe measuring 9 x 6 mm. This nodular opacity has been stable compared to prior studies. Hepatobiliary: There is a stable subcentimeter focus of decreased attenuation in the right lobe of the liver inferiorly, a likely tiny cyst. There is evidence of fatty infiltration near the fissure for the ligamentum teres, stable. No new liver lesions are evident. Gallbladder is absent. There is no appreciable biliary duct dilatation. Pancreas: There is no pancreatic mass or inflammatory focus. Spleen: No splenic lesions are evident. Adrenals/Urinary Tract: Adrenals bilaterally appear normal. There is a stable lobulated predominantly cystic mass containing foci of calcification in the lateral aspect of the right kidney measuring 3.8 x 3.5 cm. No new renal mass evident. There is an extrarenal pelvis on each side, an anatomic variant. There is no evident renal or ureteral calculus on either side. Urinary bladder is midline with wall thickness within normal limits. Stomach/Bowel: There are mid to distal sigmoid diverticula without diverticular inflammation. Mild wall thickening in this area is stable and likely reflects muscular hypertrophy from chronic diverticulosis. There is no surrounding mesenteric thickening or fluid in this area. No bowel wall thickening is seen elsewhere. The terminal ileum appears normal. There is stable fatty infiltration in the ileocecal valve. There is no evident bowel obstruction. There is no appreciable free air or portal venous air. Vascular/Lymphatic: There is no abdominal aortic aneurysm. There is aortic and iliac artery atherosclerosis. Major venous structures appear patent. There is no evident adenopathy in the abdomen or pelvis. Reproductive: The uterus is anteverted. There is no evident adnexal masses. Other: The appendix appears  normal. There is no evident abscess or ascites in the abdomen or pelvis. There is fat in the right inguinal ring, stable. Musculoskeletal: There is stable anterior wedging of the L5 vertebral body. There are no blastic or lytic bone lesions. There is a degree of spinal stenosis at L4-5 due to bony hypertrophy and disc protrusion. No intramuscular lesions are demonstrable. IMPRESSION: 1. There are stable sigmoid diverticula with  mild wall thickening in this area. This wall thickening likely represents muscular hypertrophy from chronic diverticulosis. Earliest changes of diverticulitis could present in this manner, although there are no irregular diverticula or mesenteric thickening in this area. No surrounding fluid in this area. 2. No bowel obstruction. No abscess in the abdomen or pelvis. Appendix appears normal. 3. Stable lobulated predominantly cystic mass arising from the lateral right kidney containing stable foci of calcification. Stability of this lesion over time suggests benign etiology. No renal or ureteral calculus evident. No hydronephrosis. Urinary bladder wall thickness normal. 4. Nodular opacity arising from the lateral segment of the right lower lobe measuring 9 x 6 mm appears stable compared to prior studies. No new parenchymal lung lesion in the bases. 5. There is a degree of spinal stenosis at L4-5 due to bony hypertrophy and disc protrusion. Stable anterior wedging of the L5 vertebral body. 6.  Aortic Atherosclerosis (ICD10-I70.0). 7.  Gallbladder absent. Electronically Signed   By: Lowella Grip III M.D.   On: 10/06/2020 13:29   DG Chest Portable 1 View  Result Date: 10/06/2020 CLINICAL DATA:  Shortness of breath.  Abdominal pain.  COPD. EXAM: PORTABLE CHEST 1 VIEW COMPARISON:  Chest CT 02/04/2020 and chest radiograph 01/18/2019 FINDINGS: Severe emphysema. Interstitial accentuation at the lung bases. 1.8 by 1.0 cm density at the left lung base probably from atelectasis or scarring given  the similar appearance on prior chest CT. Peripheral pulmonary nodule in the right lower lobe measuring about 0.9 cm in diameter, similar to prior CT of 02/04/2020. Atherosclerotic calcification of the aortic arch. IMPRESSION: 1. Severe emphysema with chronic interstitial accentuation at the lung bases. 2. Stable pulmonary nodule in the right lower lobe. 3. Stable left basilar scarring. Electronically Signed   By: Van Clines M.D.   On: 10/06/2020 11:51    Orson Eva, DO  [facility]  If 7PM-7AM, please contact night-coverage [email or web address] Password [facility] 10/07/2020, 12:18 PM   LOS: 0 days

## 2020-10-08 ENCOUNTER — Inpatient Hospital Stay (HOSPITAL_COMMUNITY): Payer: Medicare Other

## 2020-10-08 DIAGNOSIS — J1282 Pneumonia due to coronavirus disease 2019: Secondary | ICD-10-CM

## 2020-10-08 DIAGNOSIS — I471 Supraventricular tachycardia: Secondary | ICD-10-CM

## 2020-10-08 DIAGNOSIS — E0590 Thyrotoxicosis, unspecified without thyrotoxic crisis or storm: Secondary | ICD-10-CM

## 2020-10-08 LAB — ECHOCARDIOGRAM COMPLETE
Area-P 1/2: 4.29 cm2
Height: 61 in
S' Lateral: 3.08 cm
Weight: 1787.2 oz

## 2020-10-08 LAB — CBC WITH DIFFERENTIAL/PLATELET
Abs Immature Granulocytes: 0.02 10*3/uL (ref 0.00–0.07)
Basophils Absolute: 0 10*3/uL (ref 0.0–0.1)
Basophils Relative: 0 %
Eosinophils Absolute: 0 10*3/uL (ref 0.0–0.5)
Eosinophils Relative: 0 %
HCT: 41.7 % (ref 36.0–46.0)
Hemoglobin: 13.7 g/dL (ref 12.0–15.0)
Immature Granulocytes: 0 %
Lymphocytes Relative: 32 %
Lymphs Abs: 2.2 10*3/uL (ref 0.7–4.0)
MCH: 30.5 pg (ref 26.0–34.0)
MCHC: 32.9 g/dL (ref 30.0–36.0)
MCV: 92.9 fL (ref 80.0–100.0)
Monocytes Absolute: 0.9 10*3/uL (ref 0.1–1.0)
Monocytes Relative: 13 %
Neutro Abs: 3.8 10*3/uL (ref 1.7–7.7)
Neutrophils Relative %: 55 %
Platelets: 255 10*3/uL (ref 150–400)
RBC: 4.49 MIL/uL (ref 3.87–5.11)
RDW: 13 % (ref 11.5–15.5)
WBC: 6.9 10*3/uL (ref 4.0–10.5)
nRBC: 0 % (ref 0.0–0.2)

## 2020-10-08 LAB — GASTROINTESTINAL PANEL BY PCR, STOOL (REPLACES STOOL CULTURE)

## 2020-10-08 LAB — COMPREHENSIVE METABOLIC PANEL
ALT: 26 U/L (ref 0–44)
AST: 28 U/L (ref 15–41)
Albumin: 3.3 g/dL — ABNORMAL LOW (ref 3.5–5.0)
Alkaline Phosphatase: 48 U/L (ref 38–126)
Anion gap: 8 (ref 5–15)
BUN: 10 mg/dL (ref 8–23)
CO2: 28 mmol/L (ref 22–32)
Calcium: 8.9 mg/dL (ref 8.9–10.3)
Chloride: 103 mmol/L (ref 98–111)
Creatinine, Ser: 0.44 mg/dL (ref 0.44–1.00)
GFR, Estimated: >60 mL/min (ref >60)
Glucose, Bld: 115 mg/dL — ABNORMAL HIGH (ref 70–99)
Potassium: 4.5 mmol/L (ref 3.5–5.1)
Sodium: 139 mmol/L (ref 135–145)
Total Bilirubin: 0.7 mg/dL (ref 0.3–1.2)
Total Protein: 6.2 g/dL — ABNORMAL LOW (ref 6.5–8.1)

## 2020-10-08 LAB — T4, FREE: Free T4: 1.3 ng/dL — ABNORMAL HIGH (ref 0.61–1.12)

## 2020-10-08 LAB — C-REACTIVE PROTEIN: CRP: 0.5 mg/dL (ref <1.0)

## 2020-10-08 LAB — MAGNESIUM: Magnesium: 2.1 mg/dL (ref 1.7–2.4)

## 2020-10-08 LAB — PHOSPHORUS: Phosphorus: 3.5 mg/dL (ref 2.5–4.6)

## 2020-10-08 LAB — FERRITIN: Ferritin: 271 ng/mL (ref 11–307)

## 2020-10-08 LAB — D-DIMER, QUANTITATIVE: D-Dimer, Quant: 0.35 ug/mL-FEU (ref 0.00–0.50)

## 2020-10-08 MED ORDER — IPRATROPIUM-ALBUTEROL 20-100 MCG/ACT IN AERS
1.0000 | INHALATION_SPRAY | Freq: Four times a day (QID) | RESPIRATORY_TRACT | Status: DC
  Administered 2020-10-08 – 2020-10-09 (×4): 1 via RESPIRATORY_TRACT

## 2020-10-08 MED ORDER — METOPROLOL TARTRATE 25 MG PO TABS
37.5000 mg | ORAL_TABLET | Freq: Two times a day (BID) | ORAL | Status: DC
Start: 2020-10-08 — End: 2020-10-08
  Administered 2020-10-08: 37.5 mg via ORAL
  Filled 2020-10-08: qty 2

## 2020-10-08 MED ORDER — METOPROLOL TARTRATE 50 MG PO TABS
50.0000 mg | ORAL_TABLET | Freq: Two times a day (BID) | ORAL | Status: DC
  Administered 2020-10-08: 50 mg via ORAL
  Filled 2020-10-08 (×2): qty 1

## 2020-10-08 NOTE — Progress Notes (Addendum)
PROGRESS NOTE  Debbie [name] DJS:[id number] DOB: 11-13-1945 DOA: 10/06/2020 PCP: Celene Squibb, MD  Brief History:  75 y.o.femalewith medical history ofCOPD, hypertension, diverticulitis, GERD, coronary artery disease, left thumb nailbeSCCpresenting with 1 week history of worsening generalized weakness. Unfortunately, the patient is a difficult historian. History is supplemented by the patient's daughter and review of medical record. The patient's daughter who is also a little bit difficult historian. Nevertheless, it appears that the patient has had decreased oral intake and increasing generalized weakness over the past week. The patient normally lives by herself and is able to perform all activities of daily living. However, the patient called her daughter to come over because she was having difficulty getting out of bed and preparing her breakfast on the morning of 10/06/2020 because of generalized weakness. The patient normally is on 2 L nasal cannula which she only uses intermittently. The patient has had touse her oxygen more frequently this past week. She thinks she may be more short of breath but cannot clarify. She has had nonproductive cough but denies any chest pain, vomiting, headache, neck pain. She feels that her throat is "broke". She denies any dysuria or hematuria. She states that she has had abdominal pain for the better part of at least a year. After by multiple rounds of questioning, it appears that the patient has had loose stools this past week with increasing abdominal pain. There is no hematochezia or melena. In the emergency department, patient had low-grade temperature 99.2 F. She was tachycardic up to 124. Blood pressures were soft with systolic blood pressure in the 110-120. Oxygen saturation was 99% on 2 L. BMP showed a potassium 2.8, sodium 136, serum creatinine 0.52. LFTs were unremarkable. Lipase 64. WBC 5.2, hemoglobin 14.9, platelets  226,000. EKG shows sinus rhythm with no ST ST wave changes. Because the patient's generalized weakness and inability to care for herself with failure to thrive, admission was requested.  Assessment/Plan: Acute on chronic respiratory failure with hypoxia secondary COVID-19 -Although the patient has nothadworsening oxygen desaturation, she has had some increase shortness of breath and had to use her oxygen more frequently this past week -Personally reviewed chest x-ray--chronic interstitial changes without consolidation;bibasilar scarring -Currently stable 2 L nasal cannula -Continue IV Solu-Medrol -CRP 0.9>>0.8>>0.5 -Ferritin 314>>277>>271 -D-dimer--0.41>>0.41>>0.35 -PCT <0.10  COVID-19 gastroenteritis -Manifested by nausea, abdominal pain, and loose stools -only 2 stools since admission -Check stool pathogen panel--neg -Check C. Difficile--neg  SVT -patient having more frequent episodes of SVT -increase metoprolol to 50 mg bid -Echo EF 55-60%, no WMA, normal RV  Generalized weakness/failure toThrive -PT evaluation-->HHPT which pt refuses -Primarily being driven by the patient's COVID-19 infection -continue IV fluids -UA neg for pyuria -B12--309 -folate--22.4 -TSH 0.231  Hyperthyroidism -TSH--0.231 -Free T4--1.30 -check thyroid stimulating immunoglobulin -check Thyroperoxidase and anti-Tg  Essential hypertension -Holding amlodipine secondary to soft blood pressure -Restart metoprolol at lower dose secondary to rebound tachycardia  COPD -Patient quit smoking 19 years ago after25 pk year hx -Continue bronchodilators  Diverticulosis -10/06/2020 CT abdomen--sigmoid diverticula with mild wall thickening in the area likely representing hypertrophy from chronic diverticulosis -Doubt Patient has diverticulitis as the patient has normal white blood cell count and no abdominal pain on examination -Monitor clinically -Stool studies as discussed  above  Hypokalemia -repleted -add Kcl to IVF -check mag--1.8     Status is: Inpatient  Remains inpatient appropriate because:IV treatments appropriate due to intensity of illness or inability to take  PO   Dispo: The patient is from: Home  Anticipated d/c is to: Home  Anticipated d/c date is: 1 day  Patient currently is not medically stable to d/c.              Difficult to place patient No     Total time spent 35 minutes.  Greater than 50% spent face to face counseling and coordinating care.    Family Communication:   Daughter updated 2/14  Consultants:  none  Code Status:  FULL   DVT Prophylaxis:  [place] Lovenox   Procedures: As Listed in Progress Note Above  Antibiotics: None   Subjective: Patient denies fevers, chills, headache, chest pain, dyspnea, nausea, vomiting, diarrhea, abdominal pain, dysuria, hematuria, hematochezia, and melena.   Objective: Vitals:   10/08/20 0414 10/08/20 0749 10/08/20 0849 10/08/20 1512  BP: 125/80  131/75   Pulse: 81  88   Resp: 20  18   Temp: 98.6 F (37 C)     TempSrc: Oral     SpO2: 93% 94% 95% 96%  Weight:      Height:        Intake/Output Summary (Last 24 hours) at 10/08/2020 1630 Last data filed at 10/08/2020 0419 Gross per 24 hour  Intake --  Output 2000 ml  Net -2000 ml   Weight change:  Exam:   General:  Pt is alert, follows commands appropriately, not in acute distress  HEENT: No icterus, No thrush, No neck mass, [place]/AT  Cardiovascular: RRR, S1/S2, no rubs, no gallops  Respiratory: bibasilar rales. No wheeze  Abdomen: Soft/+BS, non tender, non distended, no guarding  Extremities: No edema, No lymphangitis, No petechiae, No rashes, no synovitis   Data Reviewed: I have personally reviewed following labs and imaging studies Basic Metabolic Panel: Recent Labs  Lab 10/06/20 1056 10/07/20 0607 10/08/20 0632  NA 136 139 139  K 2.8* 4.2  4.5  CL 97* 103 103  CO2 28 26 28   GLUCOSE 117* 126* 115*  BUN 8 5* 10  CREATININE 0.52 0.42* 0.44  CALCIUM 8.6* 8.8* 8.9  MG 2.0 1.8 2.1  PHOS  --  3.5 3.5   Liver Function Tests: Recent Labs  Lab 10/06/20 1056 10/07/20 0607 10/08/20 0632  AST 21 17 28   ALT 16 14 26   ALKPHOS 57 50 48  BILITOT 0.7 0.6 0.7  PROT 7.1 6.1* 6.2*  ALBUMIN 3.8 3.2* 3.3*   Recent Labs  Lab 10/06/20 1056  LIPASE 64*   No results for input(s): AMMONIA in the last 168 hours. Coagulation Profile: No results for input(s): INR, PROTIME in the last 168 hours. CBC: Recent Labs  Lab 10/06/20 1056 10/07/20 0607 10/08/20 0632  WBC 5.2 3.2* 6.9  NEUTROABS 2.9 1.9 3.8  HGB 14.9 13.8 13.7  HCT 43.4 41.7 41.7  MCV 90.8 92.9 92.9  PLT 226 213 255   Cardiac Enzymes: No results for input(s): CKTOTAL, CKMB, CKMBINDEX, TROPONINI in the last 168 hours. BNP: Invalid input(s): POCBNP CBG: No results for input(s): GLUCAP in the last 168 hours. HbA1C: No results for input(s): HGBA1C in the last 72 hours. Urine analysis:    Component Value Date/Time   COLORURINE YELLOW 10/06/2020 1111   APPEARANCEUR CLEAR 10/06/2020 1111   LABSPEC 1.005 10/06/2020 1111   PHURINE 6.0 10/06/2020 1111   GLUCOSEU NEGATIVE 10/06/2020 1111   HGBUR NEGATIVE 10/06/2020 1111   BILIRUBINUR NEGATIVE 10/06/2020 1111   KETONESUR 5 (A) 10/06/2020 1111   PROTEINUR NEGATIVE 10/06/2020 1111  NITRITE NEGATIVE 10/06/2020 1111   LEUKOCYTESUR NEGATIVE 10/06/2020 1111   Sepsis Labs: @LABRCNTIP (procalcitonin:4,lacticidven:4) ) Recent Results (from the past 240 hour(s))  Blood culture (routine x 2)     Status: None (Preliminary result)   Collection Time: 10/06/20 10:56 AM   Specimen: BLOOD  Result Value Ref Range Status   Specimen Description BLOOD BLOOD RIGHT HAND  Final   Special Requests   Final    BOTTLES DRAWN AEROBIC AND ANAEROBIC Blood Culture adequate volume   Culture   Final    NO GROWTH 2 DAYS Performed at [facility], [address], [place], [place] [address]    Report Status PENDING  Incomplete  Blood culture (routine x 2)     Status: None (Preliminary result)   Collection Time: 10/06/20 10:56 AM   Specimen: BLOOD  Result Value Ref Range Status   Specimen Description BLOOD BLOOD LEFT HAND  Final   Special Requests   Final    BOTTLES DRAWN AEROBIC AND ANAEROBIC Blood Culture adequate volume   Culture   Final    NO GROWTH 2 DAYS Performed at [facility], [address] [place], [place] [address]    Report Status PENDING  Incomplete  Resp Panel by RT-PCR (Flu A&B, Covid) Nasopharyngeal Swab     Status: Abnormal   Collection Time: 10/06/20 10:56 AM   Specimen: Nasopharyngeal Swab; Nasopharyngeal(NP) swabs in vial transport medium  Result Value Ref Range Status   SARS Coronavirus 2 by RT PCR POSITIVE (A) NEGATIVE Final    Comment: RESULT CALLED TO, READ BACK BY AND VERIFIED WITH: MCMILLIAN B. @ 1307 ON [id number] BY HENDERSON L. (NOTE) SARS-CoV-2 target nucleic acids are DETECTED.  The SARS-CoV-2 RNA is generally detectable in upper respiratory specimens during the acute phase of infection. Positive results are indicative of the presence of the identified virus, but do not rule out bacterial infection or co-infection with other pathogens not detected by the test. Clinical correlation with patient history and other diagnostic information is necessary to determine patient infection status. The expected result is Negative.  Fact Sheet for Patients: EntrepreneurPulse.com.au  Fact Sheet for Healthcare Providers: IncredibleEmployment.be  This test is not yet approved or cleared by the [place] FDA and  has been authorized for detection and/or diagnosis of SARS-CoV-2 by FDA under an Emergency Use Authorization (EUA).  This EUA will remain in effect (meaning this  test can be used) for the duration of  the COVID-19 declaration under Section 564(b)(1) of  the Act, 21 U.S.C. section 360bbb-3(b)(1), unless the authorization is terminated or revoked sooner.     Influenza A by PCR NEGATIVE NEGATIVE Final   Influenza B by PCR NEGATIVE NEGATIVE Final    Comment: (NOTE) The Xpert Xpress SARS-CoV-2/FLU/RSV plus assay is intended as an aid in the diagnosis of influenza from Nasopharyngeal swab specimens and should not be used as a sole basis for treatment. Nasal washings and aspirates are unacceptable for Xpert Xpress SARS-CoV-2/FLU/RSV testing.  Fact Sheet for Patients: EntrepreneurPulse.com.au  Fact Sheet for Healthcare Providers: IncredibleEmployment.be  This test is not yet approved or cleared by the [place] FDA and has been authorized for detection and/or diagnosis of SARS-CoV-2 by FDA under an Emergency Use Authorization (EUA). This EUA will remain in effect (meaning this test can be used) for the duration of the COVID-19 declaration under Section 564(b)(1) of the Act, 21 U.S.C. section 360bbb-3(b)(1), unless the authorization is terminated or revoked.  Performed at [facility], [address] [place],  [place] [address]   Gastrointestinal Panel by PCR , Stool     Status: None   Collection Time: 10/06/20  6:14 PM   Specimen: Stool  Result Value Ref Range Status   Campylobacter species NOT DETECTED NOT DETECTED Final   Plesimonas shigelloides NOT DETECTED NOT DETECTED Final   Salmonella species NOT DETECTED NOT DETECTED Final   Yersinia enterocolitica NOT DETECTED NOT DETECTED Final   Vibrio species NOT DETECTED NOT DETECTED Final   Vibrio cholerae NOT DETECTED NOT DETECTED Final   Enteroaggregative E coli (EAEC) NOT DETECTED NOT DETECTED Final   Enteropathogenic E coli (EPEC) NOT DETECTED NOT DETECTED Final   Enterotoxigenic E coli (ETEC) NOT DETECTED NOT DETECTED Final   Shiga like toxin producing E coli (STEC) NOT DETECTED NOT DETECTED Final   Shigella/Enteroinvasive E coli (EIEC) NOT  DETECTED NOT DETECTED Final   Cryptosporidium NOT DETECTED NOT DETECTED Final   Cyclospora cayetanensis NOT DETECTED NOT DETECTED Final   Entamoeba histolytica NOT DETECTED NOT DETECTED Final   Giardia lamblia NOT DETECTED NOT DETECTED Final   Adenovirus F40/41 NOT DETECTED NOT DETECTED Final   Astrovirus NOT DETECTED NOT DETECTED Final   Norovirus GI/GII NOT DETECTED NOT DETECTED Final   Rotavirus A NOT DETECTED NOT DETECTED Final   Sapovirus (I, II, IV, and V) NOT DETECTED NOT DETECTED Final    Comment: Performed at [facility], [address] [place], [place] [address]  C Difficile Quick Screen w PCR reflex     Status: None   Collection Time: 10/06/20  6:14 PM   Specimen: Stool  Result Value Ref Range Status   C Diff antigen NEGATIVE NEGATIVE Final   C Diff toxin NEGATIVE NEGATIVE Final   C Diff interpretation No C. difficile detected.  Final    Comment: Performed at [facility], [address] [place], [place] [address]     Scheduled Meds: . enoxaparin (LOVENOX) injection  40 mg Subcutaneous Q24H  . fluticasone furoate-vilanterol  1 puff Inhalation Daily  . Ipratropium-Albuterol  1 puff Inhalation Q6H WA  . methylPREDNISolone (SOLU-MEDROL) injection  0.5 mg/kg Intravenous Q12H   Followed by  . [START ON 10/09/2020] predniSONE  50 mg Oral Daily  . metoprolol tartrate  50 mg Oral BID  . pantoprazole  40 mg Oral Daily   Continuous Infusions: . 0.9 % NaCl with KCl 20 mEq / L 75 mL/hr at 10/08/20 6789    Procedures/Studies: CT ABDOMEN PELVIS W CONTRAST  Result Date: 10/06/2020 CLINICAL DATA:  Lower abdominal pain EXAM: CT ABDOMEN AND PELVIS WITH CONTRAST TECHNIQUE: Multidetector CT imaging of the abdomen and pelvis was performed using the standard protocol following bolus administration of intravenous contrast. CONTRAST:  45mL OMNIPAQUE IOHEXOL 300 MG/ML  SOLN COMPARISON:  February 04, 2020 CT abdomen and pelvis; May 10/14/2018 CT abdomen and pelvis. FINDINGS: Lower  chest: There is scarring in the lung bases. There is a stable nodular opacity in the periphery of the lateral segment of the right lower lobe measuring 9 x 6 mm. This nodular opacity has been stable compared to prior studies. Hepatobiliary: There is a stable subcentimeter focus of decreased attenuation in the right lobe of the liver inferiorly, a likely tiny cyst. There is evidence of fatty infiltration near the fissure for the ligamentum teres, stable. No new liver lesions are evident. Gallbladder is absent. There is no appreciable biliary duct dilatation. Pancreas: There is no pancreatic mass or inflammatory focus. Spleen: No splenic lesions are evident. Adrenals/Urinary Tract: Adrenals bilaterally  appear normal. There is a stable lobulated predominantly cystic mass containing foci of calcification in the lateral aspect of the right kidney measuring 3.8 x 3.5 cm. No new renal mass evident. There is an extrarenal pelvis on each side, an anatomic variant. There is no evident renal or ureteral calculus on either side. Urinary bladder is midline with wall thickness within normal limits. Stomach/Bowel: There are mid to distal sigmoid diverticula without diverticular inflammation. Mild wall thickening in this area is stable and likely reflects muscular hypertrophy from chronic diverticulosis. There is no surrounding mesenteric thickening or fluid in this area. No bowel wall thickening is seen elsewhere. The terminal ileum appears normal. There is stable fatty infiltration in the ileocecal valve. There is no evident bowel obstruction. There is no appreciable free air or portal venous air. Vascular/Lymphatic: There is no abdominal aortic aneurysm. There is aortic and iliac artery atherosclerosis. Major venous structures appear patent. There is no evident adenopathy in the abdomen or pelvis. Reproductive: The uterus is anteverted. There is no evident adnexal masses. Other: The appendix appears normal. There is no evident  abscess or ascites in the abdomen or pelvis. There is fat in the right inguinal ring, stable. Musculoskeletal: There is stable anterior wedging of the L5 vertebral body. There are no blastic or lytic bone lesions. There is a degree of spinal stenosis at L4-5 due to bony hypertrophy and disc protrusion. No intramuscular lesions are demonstrable. IMPRESSION: 1. There are stable sigmoid diverticula with mild wall thickening in this area. This wall thickening likely represents muscular hypertrophy from chronic diverticulosis. Earliest changes of diverticulitis could present in this manner, although there are no irregular diverticula or mesenteric thickening in this area. No surrounding fluid in this area. 2. No bowel obstruction. No abscess in the abdomen or pelvis. Appendix appears normal. 3. Stable lobulated predominantly cystic mass arising from the lateral right kidney containing stable foci of calcification. Stability of this lesion over time suggests benign etiology. No renal or ureteral calculus evident. No hydronephrosis. Urinary bladder wall thickness normal. 4. Nodular opacity arising from the lateral segment of the right lower lobe measuring 9 x 6 mm appears stable compared to prior studies. No new parenchymal lung lesion in the bases. 5. There is a degree of spinal stenosis at L4-5 due to bony hypertrophy and disc protrusion. Stable anterior wedging of the L5 vertebral body. 6.  Aortic Atherosclerosis (ICD10-I70.0). 7.  Gallbladder absent. Electronically Signed   By: Lowella Grip III M.D.   On: 10/06/2020 13:29   DG Chest Portable 1 View  Result Date: 10/06/2020 CLINICAL DATA:  Shortness of breath.  Abdominal pain.  COPD. EXAM: PORTABLE CHEST 1 VIEW COMPARISON:  Chest CT 02/04/2020 and chest radiograph 01/18/2019 FINDINGS: Severe emphysema. Interstitial accentuation at the lung bases. 1.8 by 1.0 cm density at the left lung base probably from atelectasis or scarring given the similar appearance on  prior chest CT. Peripheral pulmonary nodule in the right lower lobe measuring about 0.9 cm in diameter, similar to prior CT of 02/04/2020. Atherosclerotic calcification of the aortic arch. IMPRESSION: 1. Severe emphysema with chronic interstitial accentuation at the lung bases. 2. Stable pulmonary nodule in the right lower lobe. 3. Stable left basilar scarring. Electronically Signed   By: Van Clines M.D.   On: 10/06/2020 11:51   ECHOCARDIOGRAM COMPLETE  Result Date: 10/08/2020    ECHOCARDIOGRAM REPORT   Patient Name:   Debbie [name] Date of Exam: 10/08/2020 Medical Rec #:  [id number]  Height:       61.0 in Accession #:    [id number]    Weight:       111.7 lb Date of Birth:  February 21, 1946      BSA:          1.475 m Patient Age:    48 years      BP:           131/75 mmHg Patient Gender: F             HR:           88 bpm. Exam Location:  [facility] Procedure: 2D Echo Indications:    SVT (supraventricular tachycardia  History:        Patient has prior history of Echocardiogram examinations, most                 recent 11/23/2015. COPD; Risk Factors:Hypertension and Former                 Smoker. Pneumonia due to COVID-19 virus, Shingles.  Sonographer:    Leavy Cella RDCS (AE) Referring Phys: [phone] Krzysztof Reichelt IMPRESSIONS  1. Left ventricular ejection fraction, by estimation, is 55 to 60%. The left ventricle has normal function. The left ventricle has no regional wall motion abnormalities. Left ventricular diastolic parameters were normal.  2. Right ventricular systolic function is normal. The right ventricular size is normal.  3. Prominant chiari network benign.  4. The mitral valve is normal in structure. No evidence of mitral valve regurgitation. No evidence of mitral stenosis.  5. The aortic valve is tricuspid. Aortic valve regurgitation is not visualized. Mild to moderate aortic valve sclerosis/calcification is present, without any evidence of aortic stenosis.  6. The inferior vena cava is normal in size  with greater than 50% respiratory variability, suggesting right atrial pressure of 3 mmHg. FINDINGS  Left Ventricle: Left ventricular ejection fraction, by estimation, is 55 to 60%. The left ventricle has normal function. The left ventricle has no regional wall motion abnormalities. The left ventricular internal cavity size was normal in size. There is  no left ventricular hypertrophy. Left ventricular diastolic parameters were normal. Right Ventricle: The right ventricular size is normal. No increase in right ventricular wall thickness. Right ventricular systolic function is normal. Left Atrium: Left atrial size was normal in size. Right Atrium: Right atrial size was normal in size. Pericardium: There is no evidence of pericardial effusion. Mitral Valve: The mitral valve is normal in structure. No evidence of mitral valve regurgitation. No evidence of mitral valve stenosis. Tricuspid Valve: The tricuspid valve is normal in structure. Tricuspid valve regurgitation is trivial. No evidence of tricuspid stenosis. Aortic Valve: The aortic valve is tricuspid. Aortic valve regurgitation is not visualized. Mild to moderate aortic valve sclerosis/calcification is present, without any evidence of aortic stenosis. Pulmonic Valve: The pulmonic valve was normal in structure. Pulmonic valve regurgitation is not visualized. No evidence of pulmonic stenosis. Aorta: The aortic root is normal in size and structure. Venous: The inferior vena cava is normal in size with greater than 50% respiratory variability, suggesting right atrial pressure of 3 mmHg. IAS/Shunts: No atrial level shunt detected by color flow Doppler.  LEFT VENTRICLE PLAX 2D LVIDd:         4.37 cm  Diastology LVIDs:         3.08 cm  LV e' medial:    5.87 cm/s LV PW:         1.09 cm  LV E/e'  medial:  13.6 LV IVS:        0.99 cm  LV e' lateral:   5.77 cm/s LVOT diam:     1.90 cm  LV E/e' lateral: 13.9 LVOT Area:     2.84 cm  LEFT ATRIUM             Index       RIGHT  ATRIUM          Index LA diam:        3.30 cm 2.24 cm/m  RA Area:     8.60 cm LA Vol (A2C):   17.0 ml 11.53 ml/m RA Volume:   17.60 ml 11.94 ml/m LA Vol (A4C):   17.9 ml 12.14 ml/m LA Biplane Vol: 18.0 ml 12.21 ml/m   AORTA Ao Root diam: 2.60 cm MITRAL VALVE MV Area (PHT): 4.29 cm    SHUNTS MV Decel Time: 177 msec    Systemic Diam: 1.90 cm MV E velocity: 80.10 cm/s MV A velocity: 93.80 cm/s MV E/A ratio:  0.85 Jenkins Rouge MD Electronically signed by Jenkins Rouge MD Signature Date/Time: 10/08/2020/11:32:43 AM    Final     Orson Eva, DO  [facility]  If 7PM-7AM, please contact night-coverage [email or web address] Password [email or web address] 10/08/2020, 4:30 PM   LOS: 1 day

## 2020-10-08 NOTE — Progress Notes (Signed)
*  PRELIMINARY RESULTS* Echocardiogram 2D Echocardiogram has been performed.  Leavy Cella 10/08/2020, 10:33 AM

## 2020-10-08 NOTE — Progress Notes (Signed)
3:24 AM RN called saying that patient was having runs of SVT.  EKG was done and showed normal sinus rhythm at a rate of 81 bpm.  Patient was resting quietly and asymptomatic.  We will continue to monitor at this time.

## 2020-10-08 NOTE — Progress Notes (Signed)
Notify MD that patient continues to have short runs of SVT. Currently they run as high as 170s. Patient resting quietly with eyes closed.

## 2020-10-08 NOTE — Plan of Care (Signed)
  Problem: Acute Rehab PT Goals(only PT should resolve) Goal: Pt Will Go Supine/Side To Sit Outcome: Progressing Flowsheets (Taken 10/08/2020 1502) Pt will go Supine/Side to Sit: Independently Goal: Patient Will Transfer Sit To/From Stand Outcome: Progressing Flowsheets (Taken 10/08/2020 1502) Patient will transfer sit to/from stand:  Independently  with modified independence Goal: Pt Will Transfer Bed To Chair/Chair To Bed Outcome: Progressing Flowsheets (Taken 10/08/2020 1502) Pt will Transfer Bed to Chair/Chair to Bed:  Independently  with modified independence Goal: Pt Will Ambulate Outcome: Progressing Flowsheets (Taken 10/08/2020 1502) Pt will Ambulate:  50 feet  with modified independence  with supervision  with least restrictive assistive device   3:03 PM, 10/08/20 Lonell Grandchild, MPT Physical Therapist with [facility] [phone] office [phone] mobile phone

## 2020-10-08 NOTE — Evaluation (Signed)
Physical Therapy Evaluation Patient Details Name: Debbie [name] MRN: [id number] DOB: [birth year] Today's Date: 10/08/2020   History of Present Illness  Debbie [name] is a 75 y.o. female with medical history of COPD, hypertension, diverticulitis, GERD, coronary artery disease, left thumb nailbe SCC presenting with 1 week history of worsening generalized weakness.  Unfortunately, the patient is a difficult historian.  History is supplemented by the patient's daughter and review of medical record.  The patient's daughter who is also a little bit difficult historian.  Nevertheless, it appears that the patient has had decreased oral intake and increasing generalized weakness over the past week.  The patient normally lives by herself and is able to perform all activities of daily living.  However, the patient called her daughter to come over because she was having difficulty getting out of bed and preparing her breakfast on the morning of 10/06/2020 because of generalized weakness.  The patient normally is on 2 L nasal cannula which she only uses intermittently.  The patient has had to use her oxygen more frequently this past week.  She thinks she may be more short of breath but cannot clarify.  She has had nonproductive cough but denies any chest pain, vomiting, headache, neck pain.  She feels that her throat is "broke".  She denies any dysuria or hematuria.  She states that she has had abdominal pain for the better part of at least a year.  After by multiple rounds of questioning, it appears that the patient has had loose stools this past week with increasing abdominal pain.  There is no hematochezia or melena.  In the emergency department, patient had low-grade temperature 99.2 F.  She was tachycardic up to 124.  Blood pressures were soft with systolic blood pressure in the 110-120.  Oxygen saturation was 99% on 2 L.  BMP showed a potassium 2.8, sodium 136, serum creatinine 0.52.  LFTs were unremarkable.  Lipase  64.  WBC 5.2, hemoglobin 14.9, platelets 226,000.  EKG shows sinus rhythm with no ST ST wave changes.  Because the patient's generalized weakness and inability to care for herself with failure to thrive, admission was requested.    Clinical Impression  Patient functioning near baseline for functional mobility and gait demonstrating good return for bed mobility, transferring to commode in bathroom and ambulation in room.  Patient limited mostly due to SOB requiring frequent rest breaks, SpO2 dropped from 95% to 88% while on 2 LPM during ambulation, increased back to 95% after sitting in chair and resting.  Patient tolerated sitting up in chair after therapy - RN notified.  Patient will benefit from continued physical therapy in hospital and recommended venue below to increase strength, balance, endurance for safe ADLs and gait.      Follow Up Recommendations No PT follow up;Supervision - Intermittent    Equipment Recommendations  None recommended by PT    Recommendations for Other Services       Precautions / Restrictions Precautions Precautions: Fall Restrictions Weight Bearing Restrictions: No      Mobility  Bed Mobility Overal bed mobility: Modified Independent                  Transfers Overall transfer level: Modified independent                  Ambulation/Gait Ambulation/Gait assistance: Supervision Gait Distance (Feet): 20 Feet Assistive device: None Gait Pattern/deviations: Decreased step length - right;Decreased step length - left;Decreased stride length Gait velocity: decreased  General Gait Details: slightly labored cadence without loss of balance, limited mostly due to SOB with SpO2 dropping from 95% to 88% while on 2 LPM  Stairs            Wheelchair Mobility    Modified Rankin (Stroke Patients Only)       Balance Overall balance assessment: Needs assistance Sitting-balance support: Feet supported;No upper extremity  supported Sitting balance-Leahy Scale: Good Sitting balance - Comments: seated at EOB   Standing balance support: During functional activity;No upper extremity supported Standing balance-Leahy Scale: Fair Standing balance comment: fair/good without AD                             Pertinent Vitals/Pain Pain Assessment: No/denies pain    Home Living Family/patient expects to be discharged to:: Private residence Living Arrangements: Alone Available Help at Discharge: Family;Available PRN/intermittently Type of Home: House Home Access: Stairs to enter Entrance Stairs-Rails: Left Entrance Stairs-Number of Steps: 5 Home Layout: Two level;Able to live on main level with bedroom/bathroom;Full bath on main level;Other (Comment) (Patient states she does not go upstairs) Home Equipment: Walker - 4 wheels;Bedside commode Additional Comments: Sponge bathes    Prior Function Level of Independence: Needs assistance   Gait / Transfers Assistance Needed: household and short distanced community ambulator without AD  ADL's / Homemaking Assistance Needed: assisted by family        Hand Dominance   Dominant Hand: Right    Extremity/Trunk Assessment   Upper Extremity Assessment Upper Extremity Assessment: Overall WFL for tasks assessed    Lower Extremity Assessment Lower Extremity Assessment: Generalized weakness    Cervical / Trunk Assessment Cervical / Trunk Assessment: Normal  Communication   Communication: No difficulties  Cognition Arousal/Alertness: Awake/alert Behavior During Therapy: WFL for tasks assessed/performed Overall Cognitive Status: Within Functional Limits for tasks assessed                                        General Comments      Exercises     Assessment/Plan    PT Assessment Patient needs continued PT services  PT Problem List Decreased strength;Decreased activity tolerance;Decreased balance;Decreased mobility       PT  Treatment Interventions Balance training;Gait training;Stair training;Functional mobility training;Therapeutic activities;Therapeutic exercise;Patient/family education;DME instruction    PT Goals (Current goals can be found in the Care Plan section)  Acute Rehab PT Goals Patient Stated Goal: return home with family to assist PT Goal Formulation: With patient Time For Goal Achievement: 10/15/20 Potential to Achieve Goals: Good    Frequency Min 2X/week   Barriers to discharge        Co-evaluation               AM-PAC PT "6 Clicks" Mobility  Outcome Measure Help needed turning from your back to your side while in a flat bed without using bedrails?: None Help needed moving from lying on your back to sitting on the side of a flat bed without using bedrails?: None Help needed moving to and from a bed to a chair (including a wheelchair)?: None Help needed standing up from a chair using your arms (e.g., wheelchair or bedside chair)?: None Help needed to walk in hospital room?: A Little Help needed climbing 3-5 steps with a railing? : A Little 6 Click Score: 22    End of  Session Equipment Utilized During Treatment: Oxygen Activity Tolerance: Patient tolerated treatment well;Patient limited by fatigue Patient left: in chair;with call bell/phone within reach Nurse Communication: Mobility status PT Visit Diagnosis: Unsteadiness on feet (R26.81);Other abnormalities of gait and mobility (R26.89);Muscle weakness (generalized) (M62.81)    Time: 5701-7793 PT Time Calculation (min) (ACUTE ONLY): 27 min   Charges:   PT Evaluation $PT Eval Moderate Complexity: 1 Mod PT Treatments $Therapeutic Activity: 23-37 mins        3:01 PM, 10/08/20 Lonell Grandchild, MPT Physical Therapist with [facility] [phone] office [phone] mobile phone
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
## 2020-10-09 ENCOUNTER — Telehealth: Payer: Self-pay | Admitting: "Endocrinology

## 2020-10-09 ENCOUNTER — Inpatient Hospital Stay (HOSPITAL_COMMUNITY): Payer: Medicare Other

## 2020-10-09 LAB — COMPREHENSIVE METABOLIC PANEL
ALT: 62 U/L — ABNORMAL HIGH (ref 0–44)
AST: 57 U/L — ABNORMAL HIGH (ref 15–41)
Albumin: 3.4 g/dL — ABNORMAL LOW (ref 3.5–5.0)
Alkaline Phosphatase: 54 U/L (ref 38–126)
Anion gap: 11 (ref 5–15)
BUN: 16 mg/dL (ref 8–23)
CO2: 23 mmol/L (ref 22–32)
Calcium: 9.2 mg/dL (ref 8.9–10.3)
Chloride: 107 mmol/L (ref 98–111)
Creatinine, Ser: 0.45 mg/dL (ref 0.44–1.00)
GFR, Estimated: >60 mL/min (ref >60)
Glucose, Bld: 157 mg/dL — ABNORMAL HIGH (ref 70–99)
Potassium: 4.2 mmol/L (ref 3.5–5.1)
Sodium: 141 mmol/L (ref 135–145)
Total Bilirubin: 0.6 mg/dL (ref 0.3–1.2)
Total Protein: 6.5 g/dL (ref 6.5–8.1)

## 2020-10-09 LAB — CBC WITH DIFFERENTIAL/PLATELET
Abs Immature Granulocytes: 0.02 10*3/uL (ref 0.00–0.07)
Basophils Absolute: 0 10*3/uL (ref 0.0–0.1)
Basophils Relative: 0 %
Eosinophils Absolute: 0 10*3/uL (ref 0.0–0.5)
Eosinophils Relative: 0 %
HCT: 43.2 % (ref 36.0–46.0)
Hemoglobin: 13.9 g/dL (ref 12.0–15.0)
Immature Granulocytes: 0 %
Lymphocytes Relative: 23 %
Lymphs Abs: 1.6 10*3/uL (ref 0.7–4.0)
MCH: 30.9 pg (ref 26.0–34.0)
MCHC: 32.2 g/dL (ref 30.0–36.0)
MCV: 96 fL (ref 80.0–100.0)
Monocytes Absolute: 0.4 10*3/uL (ref 0.1–1.0)
Monocytes Relative: 5 %
Neutro Abs: 5.1 10*3/uL (ref 1.7–7.7)
Neutrophils Relative %: 72 %
Platelets: 206 10*3/uL (ref 150–400)
RBC: 4.5 MIL/uL (ref 3.87–5.11)
RDW: 13.3 % (ref 11.5–15.5)
WBC: 7.2 10*3/uL (ref 4.0–10.5)
nRBC: 0 % (ref 0.0–0.2)

## 2020-10-09 LAB — PHOSPHORUS: Phosphorus: 3.6 mg/dL (ref 2.5–4.6)

## 2020-10-09 LAB — C-REACTIVE PROTEIN: CRP: 0.5 mg/dL (ref <1.0)

## 2020-10-09 LAB — D-DIMER, QUANTITATIVE: D-Dimer, Quant: 5.33 ug/mL-FEU — ABNORMAL HIGH (ref 0.00–0.50)

## 2020-10-09 LAB — MAGNESIUM: Magnesium: 2.2 mg/dL (ref 1.7–2.4)

## 2020-10-09 LAB — FERRITIN: Ferritin: 288 ng/mL (ref 11–307)

## 2020-10-09 MED ORDER — IPRATROPIUM-ALBUTEROL 20-100 MCG/ACT IN AERS
1.0000 | INHALATION_SPRAY | Freq: Three times a day (TID) | RESPIRATORY_TRACT | Status: DC
  Administered 2020-10-09: 1 via RESPIRATORY_TRACT

## 2020-10-09 MED ORDER — METOPROLOL SUCCINATE ER 50 MG PO TB24
125.0000 mg | ORAL_TABLET | Freq: Every day | ORAL | Status: DC
  Administered 2020-10-09: 125 mg via ORAL
  Filled 2020-10-09: qty 1

## 2020-10-09 MED ORDER — METOPROLOL SUCCINATE ER 50 MG PO TB24: 100.0000 mg | ORAL_TABLET | Freq: Every day | ORAL | Status: DC

## 2020-10-09 MED ORDER — PREDNISONE 50 MG PO TABS: 50.0000 mg | ORAL_TABLET | Freq: Every day | ORAL | 0 refills | Status: DC

## 2020-10-09 MED ORDER — IOHEXOL 350 MG/ML SOLN
75.0000 mL | Freq: Once | INTRAVENOUS | Status: AC | PRN
  Administered 2020-10-09: 75 mL via INTRAVENOUS

## 2020-10-09 MED ORDER — METOPROLOL SUCCINATE ER 25 MG PO TB24: 25.0000 mg | ORAL_TABLET | Freq: Every day | ORAL | 1 refills | Status: DC

## 2020-10-09 NOTE — Plan of Care (Signed)

## 2020-10-09 NOTE — Telephone Encounter (Signed)
-----   Message from Cassandria Anger, MD sent at 10/09/2020  9:39 AM EST ----- Lovena Le, Can you process this referral? Thanks. ----- Message ----- From: Orson Eva, MD Sent: 10/09/2020   8:51 AM EST To: Cassandria Anger, MD  Can you please schedule an appointment to see this patient in your clinic for hyperthyroidism. Autoantibodies results are pending at the time of discharge from the hospital, but the TPO is negative  Shanon Brow Tat

## 2020-10-09 NOTE — Progress Notes (Signed)
Patient discharged home with personal belongings. Her son brought her portable oxygen and she was transferred safely to her son's personal vehicle . Discharge instructions with patient and follow up appointments , verbalized understanding .

## 2020-10-09 NOTE — Telephone Encounter (Signed)
Tried contacting the patient, was unable to reach. lvm

## 2020-10-11 ENCOUNTER — Ambulatory Visit: Payer: Self-pay | Admitting: "Endocrinology

## 2020-10-12 LAB — CULTURE, BLOOD (ROUTINE X 2)
Culture: NO GROWTH
Culture: NO GROWTH
Special Requests: ADEQUATE
Special Requests: ADEQUATE

## 2020-10-13 DIAGNOSIS — J449 Chronic obstructive pulmonary disease, unspecified: Secondary | ICD-10-CM | POA: Diagnosis not present

## 2020-10-15 ENCOUNTER — Other Ambulatory Visit: Payer: Self-pay | Admitting: *Deleted

## 2020-10-15 LAB — THYROID STIMULATING IMMUNOGLOBULIN: Thyroid Stimulating Immunoglob: <0.1 IU/L (ref 0.00–0.55)

## 2020-10-15 LAB — THYROGLOBULIN ANTIBODY: Thyroglobulin Antibody: <1 IU/mL (ref 0.0–0.9)

## 2020-10-15 LAB — THYROID PEROXIDASE ANTIBODY: Thyroperoxidase Ab SerPl-aCnc: <8 IU/mL (ref 0–34)

## 2020-10-15 NOTE — Patient Outreach (Signed)
[facility]) Care Management  10/15/2020  RANIYAH CURENTON 02-20-1946 [id number]  Telephone outreach for [place] [facility] EMMI CALL: Doesn't know who to call. Mrs. Ewart was recently hospitalized for COVID.  Spoke to Mrs. Gosse shortly. She did verify her ID. She referred me to her daughter, Sharlyne Pacas, to advise about our [place] with Olivia Mackie and informed her of the reason for my call and of the services we provide. She did not want to give permission for EMR access but did talk with me about our program.  She reports her mother is [facility], but she does not have any memory impairment. She says her brother is staying with their mother at this time, cooking, cleaning and doing what ever their mother needs. She says her mother does not have an appt with Dr. Nevada Crane as she is in quarantine but is unsure how long she needs to be in quarantine. She is not aware if her mother has her meds or stopped meds as directed by the discharge papers. She is unaware if she was directed to see any other medical professional.  HAVE REQUESTED THAT [place], FIND THE DISCHARGE PAPERS AND REVIEW 1) THE MEDICATION INSTRUCTIONS, 2) APPTS TO MAKE OR KEEP, 3) CONSIDER PARTICIPATING WITH ME FOR CARE MANAGEMENT SERVICES FOR THEIR MOTHER.  Will send a letter and brochure for their future reference and asked that Olivia Mackie keep my number, in case, she decides to call me back for assistance.  Eulah Pont. Myrtie Neither, MSN, [facility] Gerontological Nurse Practitioner [facility] Care Management [phone]

## 2020-10-22 DIAGNOSIS — I2510 Atherosclerotic heart disease of native coronary artery without angina pectoris: Secondary | ICD-10-CM | POA: Diagnosis not present

## 2020-10-22 DIAGNOSIS — F5101 Primary insomnia: Secondary | ICD-10-CM | POA: Diagnosis not present

## 2020-10-22 DIAGNOSIS — R911 Solitary pulmonary nodule: Secondary | ICD-10-CM | POA: Diagnosis not present

## 2020-10-22 DIAGNOSIS — J441 Chronic obstructive pulmonary disease with (acute) exacerbation: Secondary | ICD-10-CM | POA: Diagnosis not present

## 2020-10-22 DIAGNOSIS — R079 Chest pain, unspecified: Secondary | ICD-10-CM | POA: Diagnosis not present

## 2020-10-24 ENCOUNTER — Ambulatory Visit: Payer: Medicare Other | Admitting: Pulmonary Disease

## 2020-10-29 ENCOUNTER — Ambulatory Visit: Payer: Self-pay | Admitting: "Endocrinology

## 2020-10-31 ENCOUNTER — Ambulatory Visit: Payer: Medicare Other | Admitting: Gastroenterology

## 2020-11-07 ENCOUNTER — Encounter: Payer: Self-pay | Admitting: "Endocrinology

## 2020-11-07 ENCOUNTER — Ambulatory Visit: Payer: Medicare Other | Admitting: "Endocrinology

## 2020-11-07 ENCOUNTER — Other Ambulatory Visit: Payer: Self-pay

## 2020-11-07 VITALS — BP 140/74 | HR 100 | Ht 61.0 in | Wt 110.6 lb

## 2020-11-07 DIAGNOSIS — E0590 Thyrotoxicosis, unspecified without thyrotoxic crisis or storm: Secondary | ICD-10-CM

## 2020-11-07 NOTE — Progress Notes (Signed)
Endocrinology Consult Note                                            11/07/2020, 5:04 PM Consultation for hyperthyroidism  Subjective:    Patient ID: Debbie [name], female    DOB: 11-03-1945, PCP Celene Squibb, MD   Past Medical History:  Diagnosis Date  . Arthritis    hands  . Cancer (HCC)    chest- squamous thumb  . Complication of anesthesia 1997   Hard to awaken  . COPD (chronic obstructive pulmonary disease) ([place])   . Dyspnea   . Heart failure ([place]) 05/05/2019  . Hypertension   . Macular degeneration    " early stage"  . Pneumonia     x 2  . Rheumatic fever   . Shingles   . Supplemental oxygen dependent   . Wears dentures    full upper  . Wears glasses    Past Surgical History:  Procedure Laterality Date  . AMPUTATION Left 04/04/2019   Procedure: AMPUTATION LEFT THUMB;  Surgeon: Leanora Cover, MD;  Location: [facility];  Service: Orthopedics;  Laterality: Left;  . CESAREAN SECTION    . CHOLECYSTECTOMY    . MULTIPLE TOOTH EXTRACTIONS     Social History   Socioeconomic History  . Marital status: Divorced    Spouse name: Not on file  . Number of children: 5  . Years of education: Not on file  . Highest education level: Not on file  Occupational History  . Not on file  Tobacco Use  . Smoking status: Former Smoker    Years: 25.00    Types: Cigarettes    Quit date: 1992    Years since quitting: 30.2  . Smokeless tobacco: Never Used  Vaping Use  . Vaping Use: Former  Substance and Sexual Activity  . Alcohol use: No  . Drug use: No  . Sexual activity: Not on file  Other Topics Concern  . Not on file  Social History Narrative  . Not on file   Social Determinants of Health   Financial Resource Strain: Not on file  Food Insecurity: Not on file  Transportation Needs: Not on file  Physical Activity: Not on file  Stress: Not on file  Social Connections: Not on file   Family History  Problem Relation Age of Onset  . Cancer Mother   . Cancer  Father   . Heart disease Father   . Cancer Brother    Outpatient Encounter Medications as of 11/07/2020  Medication Sig  . albuterol (PROVENTIL HFA;VENTOLIN HFA) 108 (90 Base) MCG/ACT inhaler Inhale 1 puff into the lungs every 6 (six) hours as needed for wheezing or shortness of breath.  . dicyclomine (BENTYL) 10 MG capsule Take 1 capsule (10 mg total) by mouth 3 (three) times daily as needed for spasms (pain).  Marland Kitchen gabapentin (NEURONTIN) 100 MG capsule Take 100 mg by mouth 3 (three) times daily as needed.  Marland Kitchen ipratropium-albuterol (DUONEB) 0.5-2.5 (3) MG/3ML SOLN Take 3 mLs by nebulization 2 (two) times a day.  Marland Kitchen LORazepam (ATIVAN) 0.5 MG tablet Take 0.25-0.5 mg by mouth at bedtime as needed.  . metoprolol succinate (TOPROL-XL) 100 MG 24 hr tablet Take 100 mg by mouth daily. Take with or immediately following a meal.  . metoprolol succinate (TOPROL-XL) 25 MG 24 hr tablet Take  1 tablet (25 mg total) by mouth daily. Take with metoprolol 100 mg once daily to make total of 125 mg  . Multiple Vitamins-Minerals (VISION-VITE PRESERVE PO) Take by mouth daily.   . OXYGEN Inhale 2 L into the lungs daily.  . pantoprazole (PROTONIX) 40 MG tablet Take 40 mg by mouth daily.  . predniSONE (DELTASONE) 50 MG tablet Take 1 tablet (50 mg total) by mouth daily. (Patient not taking: Reported on 11/07/2020)  . Propylene Glycol 0.6 % SOLN Place 2 drops into both eyes daily as needed (for dry eyes).   . SYMBICORT 160-4.5 MCG/ACT inhaler Inhale 2 puffs into the lungs 2 (two) times daily.    No facility-administered encounter medications on file as of 11/07/2020.   ALLERGIES: No Known Allergies  VACCINATION STATUS: Immunization History  Administered Date(s) Administered  . Fluad Quad(high Dose 65+) 06/02/2019, 06/05/2020  . Influenza-Unspecified 05/26/2015    HPI Debbie [name] is 75 y.o. female who presents today with a medical history as above.  She is accompanied by her daughter to the clinic today.  History  is obtained from the patient as well as chart review. she is being seen in consultation for hyperthyroidism requested by Celene Squibb, MD. She denies any prior history of thyroid dysfunction.  During her recent hospitalization in [facility] she was found to have suppressed TSH on 2 occasions associated with slight increase in free T4.  She has not started on any antithyroid intervention at this time. She has symptoms including chronic tremors, palpitations.  She has no significant weight loss.  Her medical history includes COPD from prior heavy smoking, hypertension on treatment with metoprolol 125 mg daily.  Her medical history also includes anxiety. She denies heat intolerance.  She has no family history of thyroid dysfunction.  She has no family history of thyroid malignancy.  Review of Systems  Constitutional: + Minimally fluctuating body weight, + fatigue using oxygen per nasal cannula and portable oxygen canister, no subjective hyperthermia, no subjective hypothermia Eyes: no blurry vision, no xerophthalmia ENT: no sore throat, no nodules palpated in throat, no dysphagia/odynophagia, no hoarseness Cardiovascular: no Chest Pain, no Shortness of Breath, no palpitations, no leg swelling Respiratory: + cough, + exertional shortness of breath Gastrointestinal: no Nausea/Vomiting/Diarhhea Musculoskeletal: no muscle/joint aches Skin: no rashes Neurological: + tremors, no numbness, no tingling, no dizziness Psychiatric: no depression, + anxiety  Objective:    Vitals with BMI 11/07/2020 10/09/2020 10/09/2020  Height 5\' 1"  - -  Weight 110 lbs 10 oz - -  BMI 96.04 - -  Systolic 540 981 191  Diastolic 74 78 70  Pulse 478 84 76    BP 140/74   Pulse 100   Ht 5\' 1"  (1.549 m)   Wt 110 lb 9.6 oz (50.2 kg)   BMI 20.90 kg/m   Wt Readings from Last 3 Encounters:  11/07/20 110 lb 9.6 oz (50.2 kg)  10/06/20 111 lb 11.2 oz (50.7 kg)  08/01/20 114 lb 9.6 oz (52 kg)    Physical  Exam  Constitutional:  Body mass index is 20.9 kg/m.,  not in acute distress, normal state of mind, + on oxygen per nasal cannula/oxygen canister Eyes: PERRLA, EOMI, no exophthalmos ENT: moist mucous membranes, no gross thyromegaly, no gross cervical lymphadenopathy Cardiovascular: normal precordial activity, Regular Rate and Rhythm, no Murmur/Rubs/Gallops Respiratory:  adequate breathing efforts, no gross chest deformity, + diffuse wheezes and crackles. Gastrointestinal: abdomen soft, Non -tender, No distension, Bowel Sounds present, no gross  organomegaly Musculoskeletal: no gross deformities, strength intact in all four extremities Skin: moist, warm, no rashes Neurological: + tremor with outstretched hands, Deep tendon reflexes normal in bilateral lower extremities.  CMP ( most recent) CMP     Component Value Date/Time   NA 141 10/09/2020 0516   K 4.2 10/09/2020 0516   CL 107 10/09/2020 0516   CO2 23 10/09/2020 0516   GLUCOSE 157 (H) 10/09/2020 0516   BUN 16 10/09/2020 0516   CREATININE 0.45 10/09/2020 0516   CALCIUM 9.2 10/09/2020 0516   PROT 6.5 10/09/2020 0516   ALBUMIN 3.4 (L) 10/09/2020 0516   AST 57 (H) 10/09/2020 0516   ALT 62 (H) 10/09/2020 0516   ALKPHOS 54 10/09/2020 0516   BILITOT 0.6 10/09/2020 0516   GFRNONAA >60 10/09/2020 0516   GFRAA >60 02/03/2020 2034    Lipid Panel     Component Value Date/Time   TRIG 69 10/06/2020 1056      Lab Results  Component Value Date   TSH 0.231 (L) 10/07/2020   FREET4 1.30 (H) 10/08/2020    Assessment & Plan:   1. Hyperthyroidism  - Kaytee L Mcclane  is being seen at a kind request of Nevada Crane, Edwinna Areola, MD. - I have reviewed her available thyroid records and clinically evaluated the patient. - Based on these reviews, she has abnormal thyroid function tests suggestive of primary hypothyroidism.  However, she will need confirmatory studies including repeat thyroid function test and thyroid uptake and scan.  If she is  confirmed to have primary hyperthyroidism, she will be considered for definitive treatment.  Options of therapy were briefly discussed with her. If she is found to have toxic multinodular goiter, or Graves' disease, she will be considered for radioactive iodine thyroid ablation.  She is made aware of the likelihood of post therapy hypothyroidism with subsequent thyroid hormone replacement for life.  -She is already on beta-blocker, did not initiate any new prescriptions for her today.  - she is advised to maintain close follow up with Celene Squibb, MD for primary care needs.   - Time spent with the patient: 65 minutes, of which >50% was spent in  counseling her about her abnormal thyroid function tests suggestive of primary hyperthyroidism and the rest in obtaining information about her symptoms, reviewing her previous labs/studies ( including abstractions from other facilities),  evaluations, and treatments,  and developing a plan to confirm diagnosis and long term treatment based on the latest standards of care/guidelines; and documenting her care.  Debbie [name] and her daughter Olivia Mackie participated in the discussions, expressed understanding, and voiced agreement with the above plans.  All questions were answered to her satisfaction. she is encouraged to contact clinic should she have any questions or concerns prior to her return visit.  Follow up plan: Return in about 1 week (around 11/14/2020) for Labs Today- Non-Fasting Ok, F/U with Thyroid Uptake and Scan.   Glade Lloyd, MD [facility] [address] [place], [place] [address] Phone: [phone]  Fax: [phone]     11/07/2020, 5:04 PM  This note was partially dictated with voice recognition software. Similar sounding words can be transcribed inadequately or may not  be corrected upon review.

## 2020-11-08 LAB — TSH: TSH: 1.22 u[IU]/mL (ref 0.450–4.500)

## 2020-11-08 LAB — T4, FREE: Free T4: 1.41 ng/dL (ref 0.82–1.77)

## 2020-11-08 LAB — T3, FREE: T3, Free: 3.3 pg/mL (ref 2.0–4.4)

## 2020-11-10 DIAGNOSIS — J449 Chronic obstructive pulmonary disease, unspecified: Secondary | ICD-10-CM | POA: Diagnosis not present

## 2020-11-16 ENCOUNTER — Encounter: Payer: Self-pay | Admitting: "Endocrinology

## 2020-11-16 ENCOUNTER — Telehealth (INDEPENDENT_AMBULATORY_CARE_PROVIDER_SITE_OTHER): Payer: Medicare Other | Admitting: "Endocrinology

## 2020-11-16 ENCOUNTER — Other Ambulatory Visit: Payer: Self-pay

## 2020-11-16 VITALS — BP 140/74 | Ht 61.5 in | Wt 110.0 lb

## 2020-11-16 DIAGNOSIS — R946 Abnormal results of thyroid function studies: Secondary | ICD-10-CM

## 2020-11-16 NOTE — Progress Notes (Signed)
11/16/2020, 12:44 PM                                 Endocrinology Telehealth Visit Follow up Note -During COVID -19 Pandemic  This visit type was conducted  via telephone due to national recommendations for restrictions regarding the COVID-19 Pandemic  in an effort to limit this patient's exposure and mitigate transmission of the corona virus.   I connected with Debbie [name] on 11/16/2020   by telephone and verified that I am speaking with the correct person using two identifiers. Debbie [name], 12-06-1945. she has verbally consented to this visit.  I, Glade Lloyd , MD was in my office and patient , Debbie [name] , was in her residence. No other persons were with me during the encounter. All issues noted in this document were discussed and addressed. The format was not optimal for physical exam.   Subjective:    Patient ID: Debbie [name], female    DOB: [birth year], PCP Celene Squibb, MD   Past Medical History:  Diagnosis Date  . Arthritis    hands  . Cancer (HCC)    chest- squamous thumb  . Complication of anesthesia 1997   Hard to awaken  . COPD (chronic obstructive pulmonary disease) ([place])   . Dyspnea   . Heart failure ([place]) 05/05/2019  . Hypertension   . Macular degeneration    " early stage"  . Pneumonia     x 2  . Rheumatic fever   . Shingles   . Supplemental oxygen dependent   . Wears dentures    full upper  . Wears glasses    Past Surgical History:  Procedure Laterality Date  . AMPUTATION Left 04/04/2019   Procedure: AMPUTATION LEFT THUMB;  Surgeon: Leanora Cover, MD;  Location: [facility];  Service: Orthopedics;  Laterality: Left;  . CESAREAN SECTION    . CHOLECYSTECTOMY    . MULTIPLE TOOTH EXTRACTIONS     Social History   Socioeconomic History  . Marital status: Divorced    Spouse name: Not on file  . Number of children: 5  . Years of education: Not on file  . Highest education level: Not on file  Occupational  History  . Not on file  Tobacco Use  . Smoking status: Former Smoker    Years: 25.00    Types: Cigarettes    Quit date: 1992    Years since quitting: 30.2  . Smokeless tobacco: Never Used  Vaping Use  . Vaping Use: Former  Substance and Sexual Activity  . Alcohol use: No  . Drug use: No  . Sexual activity: Not on file  Other Topics Concern  . Not on file  Social History Narrative  . Not on file   Social Determinants of Health   Financial Resource Strain: Not on file  Food Insecurity: Not on file  Transportation Needs: Not on file  Physical Activity: Not on file  Stress: Not on file  Social Connections: Not on file   Family History  Problem Relation Age of Onset  . Cancer Mother   . Cancer Father   . Heart disease Father   .  Cancer Brother    Outpatient Encounter Medications as of 11/16/2020  Medication Sig  . albuterol (PROVENTIL HFA;VENTOLIN HFA) 108 (90 Base) MCG/ACT inhaler Inhale 1 puff into the lungs every 6 (six) hours as needed for wheezing or shortness of breath.  . dicyclomine (BENTYL) 10 MG capsule Take 1 capsule (10 mg total) by mouth 3 (three) times daily as needed for spasms (pain).  Marland Kitchen gabapentin (NEURONTIN) 100 MG capsule Take 100 mg by mouth 3 (three) times daily as needed.  Marland Kitchen ipratropium-albuterol (DUONEB) 0.5-2.5 (3) MG/3ML SOLN Take 3 mLs by nebulization 2 (two) times a day.  Marland Kitchen LORazepam (ATIVAN) 0.5 MG tablet Take 0.25-0.5 mg by mouth at bedtime as needed.  . metoprolol succinate (TOPROL-XL) 100 MG 24 hr tablet Take 100 mg by mouth daily. Take with or immediately following a meal.  . metoprolol succinate (TOPROL-XL) 25 MG 24 hr tablet Take 1 tablet (25 mg total) by mouth daily. Take with metoprolol 100 mg once daily to make total of 125 mg  . Multiple Vitamins-Minerals (VISION-VITE PRESERVE PO) Take by mouth daily.   . OXYGEN Inhale 2 L into the lungs daily.  . pantoprazole (PROTONIX) 40 MG tablet Take 40 mg by mouth daily.  . predniSONE (DELTASONE)  50 MG tablet Take 1 tablet (50 mg total) by mouth daily. (Patient not taking: Reported on 11/07/2020)  . Propylene Glycol 0.6 % SOLN Place 2 drops into both eyes daily as needed (for dry eyes).   . SYMBICORT 160-4.5 MCG/ACT inhaler Inhale 2 puffs into the lungs 2 (two) times daily.    No facility-administered encounter medications on file as of 11/16/2020.   ALLERGIES: No Known Allergies  VACCINATION STATUS: Immunization History  Administered Date(s) Administered  . Fluad Quad(high Dose 65+) 06/02/2019, 06/05/2020  . Influenza-Unspecified 05/26/2015    HPI Debbie [name] is 75 y.o. female who was being engaged in telehealth in follow-up after she was seen in consultation for abnormal thyroid function test during her last visit.    She is not on any thyroid hormone nor antithyroid intervention. She denies any prior history of thyroid dysfunction.  During her recent hospitalization in [facility] she was found to have suppressed TSH on 2 occasions associated with slight increase in free T4.   She reported nonspecific symptoms including chronic tremors, intermittent palpitations.  She had no significant weight change recently.  Her repeat full profile thyroid function test was normal.  Her uptake and scan was canceled after it was ordered based on her last thyroid function test results.     Her medical history includes COPD from prior heavy smoking, hypertension on treatment with metoprolol 125 mg daily.  Her medical history also includes anxiety. She denies heat intolerance.  She has no family history of thyroid dysfunction.  She has no family history of thyroid malignancy.  Review of Systems Limited as above.  Objective:    Vitals with BMI 11/16/2020 11/07/2020 10/09/2020  Height 5' 1.5" 5\' 1"  -  Weight 110 lbs 110 lbs 10 oz -  BMI 68.34 19.62 -  Systolic 229 798 921  Diastolic 74 74 78  Pulse - 100 84    BP 140/74   Ht 5' 1.5" (1.562 m)   Wt 110 lb (49.9 kg)   BMI 20.45  kg/m   Wt Readings from Last 3 Encounters:  11/16/20 110 lb (49.9 kg)  11/07/20 110 lb 9.6 oz (50.2 kg)  10/06/20 111 lb 11.2 oz (50.7 kg)    Physical Exam  CMP ( most recent) CMP     Component Value Date/Time   NA 141 10/09/2020 0516   K 4.2 10/09/2020 0516   CL 107 10/09/2020 0516   CO2 23 10/09/2020 0516   GLUCOSE 157 (H) 10/09/2020 0516   BUN 16 10/09/2020 0516   CREATININE 0.45 10/09/2020 0516   CALCIUM 9.2 10/09/2020 0516   PROT 6.5 10/09/2020 0516   ALBUMIN 3.4 (L) 10/09/2020 0516   AST 57 (H) 10/09/2020 0516   ALT 62 (H) 10/09/2020 0516   ALKPHOS 54 10/09/2020 0516   BILITOT 0.6 10/09/2020 0516   GFRNONAA >60 10/09/2020 0516   GFRAA >60 02/03/2020 2034    Lipid Panel     Component Value Date/Time   TRIG 69 10/06/2020 1056      Lab Results  Component Value Date   TSH 1.220 11/07/2020   TSH 0.231 (L) 10/07/2020   FREET4 1.41 11/07/2020   FREET4 1.30 (H) 10/08/2020    Assessment & Plan:   1. Abnormal thyroid function tests-resolved. Her repeat thyroid function tests as an outpatient were consistent with euthyroid presentation.  Her uptake and scan was canceled after it was ordered based on her original thyroid function test.  She will not need antithyroid intervention at this time.  However, she may need repeat thyroid function test including TSH, free T4, free T3 in 1 year.    -She is already on beta-blocker, did not initiate any new prescriptions for her today.  - she is advised to maintain close follow up with Celene Squibb, MD for primary care needs.    - Time spent on this patient care encounter:  30 minutes of which 50% was spent in  counseling and the rest reviewing  her current and  previous labs / studies and medications  doses and developing a plan for long term care, and documenting this care. Debbie [name]  participated in the discussions, expressed understanding, and voiced agreement with the above plans.  All questions were answered to her  satisfaction. she is encouraged to contact clinic should she have any questions or concerns prior to her return visit.   Follow up plan: Return in about 1 year (around 11/16/2021) for F/U with Pre-visit Labs.   Glade Lloyd, MD [facility] [address] [place], [place] [address] Phone: [phone]  Fax: [phone]     11/16/2020, 12:44 PM  This note was partially dictated with voice recognition software. Similar sounding words can be transcribed inadequately or may not  be corrected upon review.

## 2020-11-19 IMAGING — CR PORTABLE CHEST - 1 VIEW
1 series · 1 of 1 positions shown · non-contrast
Comparison: 01/14/2019

CLINICAL DATA: Acute respiratory distress.

EXAM:
PORTABLE CHEST 1 VIEW

[AP]
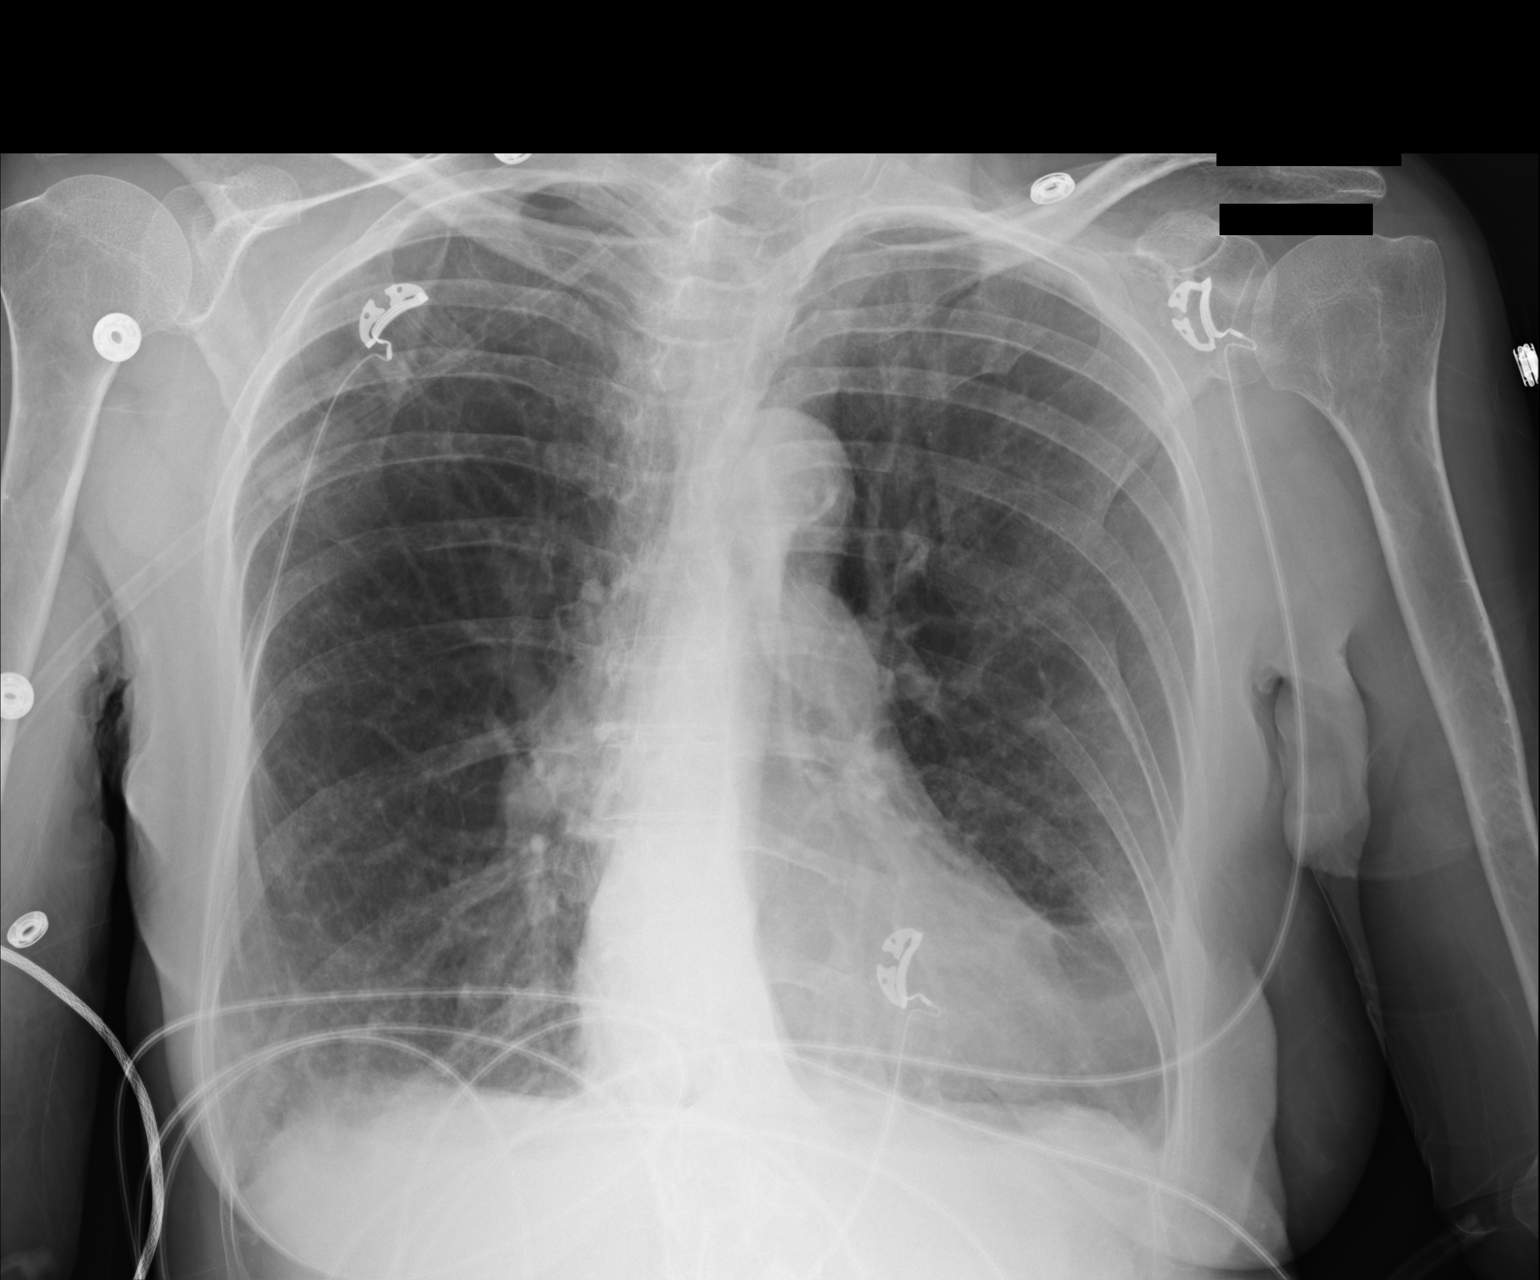

[1 of 1 positions shown; findings below may reference images not displayed]

FINDINGS: Normal heart size. Calcified tortuous aorta. Asymmetric at the RIGHT
lower lung zone, not present previous, could represent pneumonia
versus subsegmental atelectasis due to sternal and rib fractures. No
hemothorax or pneumothorax.
IMPRESSION: Asymmetric RIGHT lower lung zone, not present previous, could
represent pneumonia versus subsegmental atelectasis.
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
## 2020-11-21 ENCOUNTER — Other Ambulatory Visit: Payer: Self-pay

## 2020-11-21 ENCOUNTER — Encounter: Payer: Self-pay | Admitting: Pulmonary Disease

## 2020-11-21 ENCOUNTER — Ambulatory Visit: Payer: Medicare Other | Admitting: Pulmonary Disease

## 2020-11-21 ENCOUNTER — Ambulatory Visit: Payer: Medicare Other | Admitting: "Endocrinology

## 2020-11-21 VITALS — BP 152/88 | HR 91 | Temp 97.1°F | Ht 61.0 in | Wt 107.8 lb

## 2020-11-21 DIAGNOSIS — J449 Chronic obstructive pulmonary disease, unspecified: Secondary | ICD-10-CM | POA: Diagnosis not present

## 2020-11-21 DIAGNOSIS — J9611 Chronic respiratory failure with hypoxia: Secondary | ICD-10-CM | POA: Diagnosis not present

## 2020-11-21 NOTE — Patient Instructions (Signed)
Follow up in 6 months 

## 2020-11-21 NOTE — Progress Notes (Signed)
[facility]  Chief Complaint  Patient presents with  . Follow-up    Chest and nose congestion for past 1-2 months, productive cough with white phlegm    Constitutional:  BP (!) 152/88 (BP Location: Left Arm, Cuff Size: Normal)   Pulse 91   Temp (!) 97.1 F (36.2 C) (Other (Comment)) Comment (Src): wrist  Ht 5\' 1"  (1.549 m)   Wt 107 lb 12.8 oz (48.9 kg)   SpO2 95% Comment: 2L Pulse O2  BMI 20.37 kg/m   Past Medical History:  Squamous cell carcinoma nailbed, Renal cyst, OA, HTN, Macular degeneration, Pneumonia, Rheumatic fever, Shingles, COVID 13 October 2020  Past Surgical History:  She  has a past surgical history that includes Cholecystectomy; Cesarean section; Multiple tooth extractions; and Amputation (Left, 04/04/2019).  Brief Summary:  Debbie [name] is a 75 y.o. female former smoker with COPD/emphysema and chronic hypoxic respiratory failure.      Subjective:   She is here with her daughter.  She was in hospital in February for [place] 19 infection.  She feels like her symptoms are back to baseline.  She has intermittent cough with clear sputum.  Gets tight in her chest when she goes outside around plants.  Has intermittent leg swelling, and gets better when her legs are up.  She uses oxygen with exertion and sleep.  She can take oxygen off during the day when she is sitting.  Physical Exam:   Appearance - well kempt, wearing oxygen  ENMT - no sinus tenderness, no oral exudate, no LAN, Mallampati 2 airway, no stridor  Respiratory - decreased breath sounds bilaterally, no wheezing or rales  CV - s1s2 regular rate and rhythm, no murmurs  Ext - no clubbing, no edema  Skin - no rashes  Psych - normal mood and affect   Pulmonary testing:   PFT 01/13/13 >> FEV1 0.63 (28%), FEV1% 28, TLC 6.12 (130%), DLCO 28%, +BD  Chest Imaging:   CT angio chest 01/18/19 >> atherosclerosis, centrilobular emphysema, 8 mm RLL  nodule  CT angio chest 10/09/20 >> mod centrilobular and paraseptal emphysema, ATX lingula, scarring RML and RLL, 4 mm nodule RLL no change, 8 mm nodule RLL no change  Sleep Tests:    Cardiac Tests:   Echo 10/08/20 >> EF 55 to 60%  Social History:  She  reports that she quit smoking about 30 years ago. Her smoking use included cigarettes. She quit after 25.00 years of use. She has never used smokeless tobacco. She reports that she does not drink alcohol and does not use drugs.  Family History:  Her family history includes Cancer in her brother, father, and mother; Heart disease in her father.     Assessment/Plan:   COPD with emphysema. - wasn't able to tolerate breztri - continue symbicort - prn duoneb and albuterol - advised to try pretreating with albuterol before she goes out in her garden  Chronic respiratory failure with hypoxia. - goal SpO2 > 90% - continue 2 liters oxygen with exertion and sleep  Time Spent Involved in Patient Care on Day of Examination:  22 minutes  Follow up:  Patient Instructions  Follow up in 6 months   Medication List:   Allergies as of 11/21/2020   No Known Allergies     Medication List       Accurate as of November 21, 2020 11:27 AM. If you have any questions, ask your nurse or doctor.  STOP taking these medications   predniSONE 50 MG tablet Commonly known as: DELTASONE Stopped by: Chesley Mires, MD     TAKE these medications   albuterol 108 (90 Base) MCG/ACT inhaler Commonly known as: VENTOLIN HFA Inhale 1 puff into the lungs every 6 (six) hours as needed for wheezing or shortness of breath.   dicyclomine 10 MG capsule Commonly known as: BENTYL Take 1 capsule (10 mg total) by mouth 3 (three) times daily as needed for spasms (pain).   gabapentin 100 MG capsule Commonly known as: NEURONTIN Take 100 mg by mouth 3 (three) times daily as needed.   ipratropium-albuterol 0.5-2.5 (3) MG/3ML Soln Commonly known as:  DUONEB Take 3 mLs by nebulization 2 (two) times a day.   LORazepam 0.5 MG tablet Commonly known as: ATIVAN Take 0.25-0.5 mg by mouth at bedtime as needed.   metoprolol succinate 100 MG 24 hr tablet Commonly known as: TOPROL-XL Take 100 mg by mouth daily. Take with or immediately following a meal.   metoprolol succinate 25 MG 24 hr tablet Commonly known as: TOPROL-XL Take 1 tablet (25 mg total) by mouth daily. Take with metoprolol 100 mg once daily to make total of 125 mg   OXYGEN Inhale 2 L into the lungs daily.   pantoprazole 40 MG tablet Commonly known as: PROTONIX Take 40 mg by mouth daily.   Propylene Glycol 0.6 % Soln Place 2 drops into both eyes daily as needed (for dry eyes).   Symbicort 160-4.5 MCG/ACT inhaler Generic drug: budesonide-formoterol Inhale 2 puffs into the lungs 2 (two) times daily.   VISION-VITE PRESERVE PO Take by mouth daily.       Signature:  Chesley Mires, MD [place] Pager - [phone] 11/21/2020, 11:27 AM

## 2020-11-28 ENCOUNTER — Ambulatory Visit: Payer: Medicare Other | Admitting: "Endocrinology

## 2020-12-04 ENCOUNTER — Inpatient Hospital Stay (HOSPITAL_COMMUNITY): Payer: Medicare Other | Attending: Hematology

## 2020-12-04 ENCOUNTER — Other Ambulatory Visit: Payer: Self-pay

## 2020-12-04 DIAGNOSIS — Z9981 Dependence on supplemental oxygen: Secondary | ICD-10-CM | POA: Diagnosis not present

## 2020-12-04 DIAGNOSIS — Z85828 Personal history of other malignant neoplasm of skin: Secondary | ICD-10-CM | POA: Insufficient documentation

## 2020-12-04 DIAGNOSIS — Z87891 Personal history of nicotine dependence: Secondary | ICD-10-CM | POA: Insufficient documentation

## 2020-12-04 DIAGNOSIS — N281 Cyst of kidney, acquired: Secondary | ICD-10-CM | POA: Insufficient documentation

## 2020-12-04 DIAGNOSIS — C44609 Unspecified malignant neoplasm of skin of left upper limb, including shoulder: Secondary | ICD-10-CM

## 2020-12-04 LAB — COMPREHENSIVE METABOLIC PANEL
ALT: 12 U/L (ref 0–44)
AST: 17 U/L (ref 15–41)
Albumin: 4.3 g/dL (ref 3.5–5.0)
Alkaline Phosphatase: 67 U/L (ref 38–126)
Anion gap: 12 (ref 5–15)
BUN: 9 mg/dL (ref 8–23)
CO2: 26 mmol/L (ref 22–32)
Calcium: 9.4 mg/dL (ref 8.9–10.3)
Chloride: 104 mmol/L (ref 98–111)
Creatinine, Ser: 0.51 mg/dL (ref 0.44–1.00)
GFR, Estimated: >60 mL/min (ref >60)
Glucose, Bld: 100 mg/dL — ABNORMAL HIGH (ref 70–99)
Potassium: 4 mmol/L (ref 3.5–5.1)
Sodium: 142 mmol/L (ref 135–145)
Total Bilirubin: 0.9 mg/dL (ref 0.3–1.2)
Total Protein: 7.3 g/dL (ref 6.5–8.1)

## 2020-12-04 LAB — CBC WITH DIFFERENTIAL/PLATELET
Abs Immature Granulocytes: 0.02 10*3/uL (ref 0.00–0.07)
Basophils Absolute: 0 10*3/uL (ref 0.0–0.1)
Basophils Relative: 1 %
Eosinophils Absolute: 0.2 10*3/uL (ref 0.0–0.5)
Eosinophils Relative: 2 %
HCT: 43.8 % (ref 36.0–46.0)
Hemoglobin: 13.9 g/dL (ref 12.0–15.0)
Immature Granulocytes: 0 %
Lymphocytes Relative: 29 %
Lymphs Abs: 2.3 10*3/uL (ref 0.7–4.0)
MCH: 30.7 pg (ref 26.0–34.0)
MCHC: 31.7 g/dL (ref 30.0–36.0)
MCV: 96.7 fL (ref 80.0–100.0)
Monocytes Absolute: 0.7 10*3/uL (ref 0.1–1.0)
Monocytes Relative: 8 %
Neutro Abs: 4.8 10*3/uL (ref 1.7–7.7)
Neutrophils Relative %: 60 %
Platelets: 235 10*3/uL (ref 150–400)
RBC: 4.53 MIL/uL (ref 3.87–5.11)
RDW: 14 % (ref 11.5–15.5)
WBC: 8.1 10*3/uL (ref 4.0–10.5)
nRBC: 0 % (ref 0.0–0.2)

## 2020-12-04 LAB — LACTATE DEHYDROGENASE: LDH: 137 U/L (ref 98–192)

## 2020-12-05 ENCOUNTER — Other Ambulatory Visit: Payer: Self-pay

## 2020-12-05 ENCOUNTER — Encounter: Payer: Self-pay | Admitting: Gastroenterology

## 2020-12-05 ENCOUNTER — Ambulatory Visit: Payer: Medicare Other | Admitting: Gastroenterology

## 2020-12-05 VITALS — BP 145/80 | HR 87 | Temp 97.1°F | Ht 61.0 in | Wt 107.2 lb

## 2020-12-05 DIAGNOSIS — R1031 Right lower quadrant pain: Secondary | ICD-10-CM | POA: Diagnosis not present

## 2020-12-05 MED ORDER — DICYCLOMINE HCL 10 MG PO CAPS: 10.0000 mg | ORAL_CAPSULE | Freq: Three times a day (TID) | ORAL | 1 refills | Status: DC | PRN
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
## 2020-12-05 NOTE — Patient Instructions (Addendum)
1. Continue gabapentin 100 mg 3 times daily as needed for burning pain in your abdomen. 2. Continue dicyclomine (blue pill) 10 mg 2-3 times daily as needed for pain.  New prescription sent to your pharmacy with refills. 3. Continue pantoprazole 40 mg daily for acid reflux. 4. Return to the office in 6 months, call sooner if you have any questions or concerns.

## 2020-12-10 ENCOUNTER — Encounter: Payer: Self-pay | Admitting: Gastroenterology

## 2020-12-11 ENCOUNTER — Other Ambulatory Visit: Payer: Self-pay

## 2020-12-11 ENCOUNTER — Inpatient Hospital Stay (HOSPITAL_BASED_OUTPATIENT_CLINIC_OR_DEPARTMENT_OTHER): Payer: Medicare Other | Admitting: Hematology

## 2020-12-11 ENCOUNTER — Encounter (HOSPITAL_COMMUNITY): Payer: Self-pay | Admitting: Hematology

## 2020-12-11 VITALS — BP 154/73 | HR 93 | Temp 96.9°F | Resp 20 | Wt 108.4 lb

## 2020-12-11 DIAGNOSIS — Q6102 Congenital multiple renal cysts: Secondary | ICD-10-CM

## 2020-12-11 DIAGNOSIS — Z87891 Personal history of nicotine dependence: Secondary | ICD-10-CM | POA: Diagnosis not present

## 2020-12-11 DIAGNOSIS — J449 Chronic obstructive pulmonary disease, unspecified: Secondary | ICD-10-CM | POA: Diagnosis not present

## 2020-12-11 DIAGNOSIS — C44609 Unspecified malignant neoplasm of skin of left upper limb, including shoulder: Secondary | ICD-10-CM

## 2020-12-11 DIAGNOSIS — N281 Cyst of kidney, acquired: Secondary | ICD-10-CM | POA: Diagnosis not present

## 2020-12-11 DIAGNOSIS — Z9981 Dependence on supplemental oxygen: Secondary | ICD-10-CM | POA: Diagnosis not present

## 2020-12-11 DIAGNOSIS — Z85828 Personal history of other malignant neoplasm of skin: Secondary | ICD-10-CM | POA: Diagnosis not present

## 2020-12-11 NOTE — Progress Notes (Signed)
Debbie [name], Debbie [name]   CLINIC:  Medical Oncology/Hematology  PCP:  Celene Squibb, MD [address] [place] [address] [phone]   REASON FOR VISIT:  Follow-up for left thumb nailbed squamous cell cancer and right renal cyst  PRIOR THERAPY: Left thumb amputation through IP joint on 04/04/2019  NGS Results: Not done  CURRENT THERAPY: Surveillance  BRIEF ONCOLOGIC HISTORY:  Oncology History   No history exists.    CANCER STAGING: Cancer Staging No matching staging information was found for the patient.  INTERVAL HISTORY:  Debbie [name], a 75 y.o. female, returns for routine follow-up of her left thumb nailbed squamous cell cancer and right renal cyst. Debbie [name] was last seen on 06/05/2020.   Today she is accompanied by her daughter and she reports feeling okay. She denies having any more pain or issues with her left thumb, hand or arm. Her appetite is good.   REVIEW OF SYSTEMS:  Review of Systems  Constitutional: Positive for appetite change (75%) and fatigue (50%).  Gastrointestinal: Positive for diarrhea.  Musculoskeletal: Negative for arthralgias.  All other systems reviewed and are negative.   PAST MEDICAL/SURGICAL HISTORY:  Past Medical History:  Diagnosis Date  . Arthritis    hands  . Cancer (HCC)    chest- squamous thumb  . Complication of anesthesia 1997   Hard to awaken  . COPD (chronic obstructive pulmonary disease) ([facility])   . Dyspnea   . Heart failure ([facility]) 05/05/2019  . Hypertension   . Macular degeneration    " early stage"  . Pneumonia     x 2  . Rheumatic fever   . Shingles   . Supplemental oxygen dependent   . Wears dentures    full upper  . Wears glasses    Past Surgical History:  Procedure Laterality Date  . AMPUTATION Left 04/04/2019   Procedure: AMPUTATION LEFT THUMB;  Surgeon: Leanora Cover, MD;  Location: [facility];  Service: Orthopedics;  Laterality: Left;  . CESAREAN SECTION     . CHOLECYSTECTOMY    . MULTIPLE TOOTH EXTRACTIONS      SOCIAL HISTORY:  Social History   Socioeconomic History  . Marital status: Divorced    Spouse name: Not on file  . Number of children: 5  . Years of education: Not on file  . Highest education level: Not on file  Occupational History  . Not on file  Tobacco Use  . Smoking status: Former Smoker    Years: 25.00    Types: Cigarettes    Quit date: 1992    Years since quitting: 30.3  . Smokeless tobacco: Never Used  Vaping Use  . Vaping Use: Former  Substance and Sexual Activity  . Alcohol use: No  . Drug use: No  . Sexual activity: Not on file  Other Topics Concern  . Not on file  Social History Narrative  . Not on file   Social Determinants of Health   Financial Resource Strain: Not on file  Food Insecurity: Not on file  Transportation Needs: Not on file  Physical Activity: Not on file  Stress: Not on file  Social Connections: Not on file  Intimate Partner Violence: Not on file    FAMILY HISTORY:  Family History  Problem Relation Age of Onset  . Cancer Mother   . Cancer Father   . Heart disease Father   . Cancer Brother     CURRENT  MEDICATIONS:  Current Outpatient Medications  Medication Sig Dispense Refill  . albuterol (PROVENTIL HFA;VENTOLIN HFA) 108 (90 Base) MCG/ACT inhaler Inhale 1 puff into the lungs every 6 (six) hours as needed for wheezing or shortness of breath.    . dicyclomine (BENTYL) 10 MG capsule Take 1 capsule (10 mg total) by mouth 3 (three) times daily as needed for spasms (pain). 90 capsule 1  . gabapentin (NEURONTIN) 100 MG capsule Take 100 mg by mouth 3 (three) times daily as needed.    Marland Kitchen ipratropium-albuterol (DUONEB) 0.5-2.5 (3) MG/3ML SOLN Take 3 mLs by nebulization 2 (two) times a day.    Marland Kitchen LORazepam (ATIVAN) 0.5 MG tablet Take 0.25-0.5 mg by mouth at bedtime as needed.    . metoprolol succinate (TOPROL-XL) 100 MG 24 hr tablet Take 100 mg by mouth daily. Take with or  immediately following a meal.    . metoprolol succinate (TOPROL-XL) 25 MG 24 hr tablet Take 1 tablet (25 mg total) by mouth daily. Take with metoprolol 100 mg once daily to make total of 125 mg 30 tablet 1  . Multiple Vitamins-Minerals (VISION-VITE PRESERVE PO) Take by mouth daily.     . OXYGEN Inhale 2 L into the lungs daily.    . pantoprazole (PROTONIX) 40 MG tablet Take 40 mg by mouth daily.    Marland Kitchen Propylene Glycol 0.6 % SOLN Place 2 drops into both eyes daily as needed (for dry eyes).     . SYMBICORT 160-4.5 MCG/ACT inhaler Inhale 2 puffs into the lungs 2 (two) times daily.      No current facility-administered medications for this visit.    ALLERGIES:  No Known Allergies  PHYSICAL EXAM:  Performance status (ECOG): 1 - Symptomatic but completely ambulatory  Vitals:   12/11/20 1130  BP: (!) 154/73  Pulse: 93  Resp: 20  Temp: (!) 96.9 F (36.1 C)  SpO2: 96%   Wt Readings from Last 3 Encounters:  12/11/20 108 lb 6.4 oz (49.2 kg)  12/05/20 107 lb 3.2 oz (48.6 kg)  11/21/20 107 lb 12.8 oz (48.9 kg)   Physical Exam Vitals reviewed.  Constitutional:      Appearance: Normal appearance.  Cardiovascular:     Rate and Rhythm: Normal rate and regular rhythm.     Pulses: Normal pulses.     Heart sounds: Normal heart sounds.  Pulmonary:     Effort: Pulmonary effort is normal.     Breath sounds: Normal breath sounds.  Chest:  Breasts:     Right: No axillary adenopathy or supraclavicular adenopathy.     Left: No axillary adenopathy or supraclavicular adenopathy.    Musculoskeletal:     Left forearm: No swelling, tenderness or bony tenderness.     Left wrist: No swelling, tenderness or bony tenderness.     Left hand: No swelling, tenderness or bony tenderness.     Right lower leg: No edema.     Left lower leg: No edema.     Comments: Amputated distal phalange of L thumb  Lymphadenopathy:     Cervical: No cervical adenopathy.     Upper Body:     Right upper body: No  supraclavicular, axillary or pectoral adenopathy.     Left upper body: No supraclavicular, axillary, pectoral or epitrochlear adenopathy.  Neurological:     General: No focal deficit present.     Mental Status: She is alert and oriented to person, place, and time.  Psychiatric:        Mood and  Affect: Mood normal.        Behavior: Behavior normal.      LABORATORY DATA:  I have reviewed the labs as listed.  CBC Latest Ref Rng & Units 12/04/2020 10/09/2020 10/08/2020  WBC 4.0 - 10.5 K/uL 8.1 7.2 6.9  Hemoglobin 12.0 - 15.0 g/dL 13.9 13.9 13.7  Hematocrit 36.0 - 46.0 % 43.8 43.2 41.7  Platelets 150 - 400 K/uL 235 206 255   CMP Latest Ref Rng & Units 12/04/2020 10/09/2020 10/08/2020  Glucose 70 - 99 mg/dL 100(H) 157(H) 115(H)  BUN 8 - 23 mg/dL 9 16 10   Creatinine 0.44 - 1.00 mg/dL 0.51 0.45 0.44  Sodium 135 - 145 mmol/L 142 141 139  Potassium 3.5 - 5.1 mmol/L 4.0 4.2 4.5  Chloride 98 - 111 mmol/L 104 107 103  CO2 22 - 32 mmol/L 26 23 28   Calcium 8.9 - 10.3 mg/dL 9.4 9.2 8.9  Total Protein 6.5 - 8.1 g/dL 7.3 6.5 6.2(L)  Total Bilirubin 0.3 - 1.2 mg/dL 0.9 0.6 0.7  Alkaline Phos 38 - 126 U/L 67 54 48  AST 15 - 41 U/L 17 57(H) 28  ALT 0 - 44 U/L 12 62(H) 26   Lab Results  Component Value Date   LDH 137 12/04/2020   LDH 165 10/06/2020   LDH 135 06/05/2020    DIAGNOSTIC IMAGING:  I have independently reviewed the scans and discussed with the patient. No results found.   ASSESSMENT:  1. Left thumb nailbed squamous cell carcinoma: -Lesion at the tip of the left thumb, waxing and waning over 10 years. -Excision of the left thumb nailbed consistent with squamous cell carcinoma, positive margins. Left thumb amputation through IP joint on 04/04/2019 by Dr. Fredna Dow. -Pathology showed no residual squamous cell carcinoma, margins are free. 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 No other malignancies reported. -PET scan on 05/24/2019 showed no  evidence of metastatic disease. 9 mm right lower lobe pulmonary nodule with no FDG uptake. 4.3 cm complex cystic lesion in the right kidney cannot be characterized. No hypermetabolic activity associated with it. -CT CAP from 02/04/2020 done in the ER showed no acute cardiopulmonary disease.  1 cm right lower lobe pulmonary nodule unchanged compared to 12/2018 exam.  Colonic diverticulosis in the left lower abdomen/pelvis.  Right-sided renal cyst unchanged, consistent with benign finding.  4 mm nonobstructing right-sided renal stone.  2. Right kidney cyst: -UA was negative for hematuria. -I reviewed results of ultrasound of the kidneys on 12/01/2019 shows exophytic cluster of cysts or septated single cyst in the interpolar region of the right kidney measuring 4.3 x 2.5 x 3.5 cm. No hydronephrosis.   PLAN:  1. Left thumb nailbed squamous cell carcinoma: -No clinical signs or symptoms of recurrence. - Physical examination did not reveal any palpable adenopathy or masses. - Reviewed labs from 12/04/2020 which showed normal LFTs, calcium and creatinine.  CBC was normal.  LDH was normal. - CT chest angiogram on 10/09/2020 showed chronic right lower lobe nodules which are stable over 2 years, likely representing benign process.  No PE.  No other evidence of metastatic disease. - RTC 6 months for follow-up.  We will do scans if clinical condition dictates.  2. Right kidney cyst: -I will order renal ultrasound prior to next visit in 6 months.   Orders placed this encounter:  No orders of the defined types were placed in this encounter.    Derek Jack, MD Deneise Lever  [facility] [phone]   I, Milinda Antis, am acting as a scribe for Dr. Sanda Linger.  I, Derek Jack MD, have reviewed the above documentation for accuracy and completeness, and I agree with the above.

## 2020-12-11 NOTE — Patient Instructions (Signed)
[place] at [facility] Discharge Instructions  You were seen today by Dr. Delton Coombes. He went over your recent results and scans. You will be scheduled to have an ultrasound of your kidneys before your next visit. Dr. Delton Coombes will see you back in 6 months for labs and follow up.   Thank you for choosing [place] at [facility] to provide your oncology and hematology care.  To afford each patient quality time with our provider, please arrive at least 15 minutes before your scheduled appointment time.   If you have a lab appointment with the [facility] please come in thru the Main Entrance and check in at the main information desk  You need to re-schedule your appointment should you arrive 10 or more minutes late.  We strive to give you quality time with our providers, and arriving late affects you and other patients whose appointments are after yours.  Also, if you no show three or more times for appointments you may be dismissed from the clinic at the providers discretion.     Again, thank you for choosing [facility].  Our hope is that these requests will decrease the amount of time that you wait before being seen by our physicians.       _____________________________________________________________  Should you have questions after your visit to [facility], please contact our office at [phone] between the hours of 8:00 a.m. and 4:30 p.m.  Voicemails left after 4:00 p.m. will not be returned until the following business day.  For prescription refill requests, have your pharmacy contact our office and allow 72 hours.    Cancer Center Support Programs:   > Cancer Support Group  2nd Tuesday of the month 1pm-2pm, Journey Room

## 2020-12-23 DIAGNOSIS — I2510 Atherosclerotic heart disease of native coronary artery without angina pectoris: Secondary | ICD-10-CM | POA: Diagnosis not present

## 2020-12-23 DIAGNOSIS — E1165 Type 2 diabetes mellitus with hyperglycemia: Secondary | ICD-10-CM | POA: Diagnosis not present

## 2021-01-10 DIAGNOSIS — J449 Chronic obstructive pulmonary disease, unspecified: Secondary | ICD-10-CM | POA: Diagnosis not present

## 2021-01-28 DIAGNOSIS — E1165 Type 2 diabetes mellitus with hyperglycemia: Secondary | ICD-10-CM | POA: Diagnosis not present

## 2021-01-28 DIAGNOSIS — I2510 Atherosclerotic heart disease of native coronary artery without angina pectoris: Secondary | ICD-10-CM | POA: Diagnosis not present

## 2021-01-30 DIAGNOSIS — J449 Chronic obstructive pulmonary disease, unspecified: Secondary | ICD-10-CM | POA: Diagnosis not present

## 2021-01-30 DIAGNOSIS — E782 Mixed hyperlipidemia: Secondary | ICD-10-CM | POA: Diagnosis not present

## 2021-01-30 DIAGNOSIS — F5101 Primary insomnia: Secondary | ICD-10-CM | POA: Diagnosis not present

## 2021-01-30 DIAGNOSIS — E0590 Thyrotoxicosis, unspecified without thyrotoxic crisis or storm: Secondary | ICD-10-CM | POA: Diagnosis not present

## 2021-01-30 DIAGNOSIS — I2510 Atherosclerotic heart disease of native coronary artery without angina pectoris: Secondary | ICD-10-CM | POA: Diagnosis not present

## 2021-01-30 DIAGNOSIS — I10 Essential (primary) hypertension: Secondary | ICD-10-CM | POA: Diagnosis not present

## 2021-01-30 DIAGNOSIS — N281 Cyst of kidney, acquired: Secondary | ICD-10-CM | POA: Diagnosis not present

## 2021-01-30 DIAGNOSIS — Z85828 Personal history of other malignant neoplasm of skin: Secondary | ICD-10-CM | POA: Diagnosis not present

## 2021-02-10 DIAGNOSIS — J449 Chronic obstructive pulmonary disease, unspecified: Secondary | ICD-10-CM | POA: Diagnosis not present

## 2021-03-04 ENCOUNTER — Other Ambulatory Visit: Payer: Self-pay | Admitting: Gastroenterology

## 2021-03-12 DIAGNOSIS — J449 Chronic obstructive pulmonary disease, unspecified: Secondary | ICD-10-CM | POA: Diagnosis not present

## 2021-03-13 DIAGNOSIS — E0590 Thyrotoxicosis, unspecified without thyrotoxic crisis or storm: Secondary | ICD-10-CM | POA: Diagnosis not present

## 2021-03-13 DIAGNOSIS — I2510 Atherosclerotic heart disease of native coronary artery without angina pectoris: Secondary | ICD-10-CM | POA: Diagnosis not present

## 2021-03-21 DIAGNOSIS — E782 Mixed hyperlipidemia: Secondary | ICD-10-CM | POA: Diagnosis not present

## 2021-03-21 DIAGNOSIS — I2510 Atherosclerotic heart disease of native coronary artery without angina pectoris: Secondary | ICD-10-CM | POA: Diagnosis not present

## 2021-03-21 DIAGNOSIS — E0590 Thyrotoxicosis, unspecified without thyrotoxic crisis or storm: Secondary | ICD-10-CM | POA: Diagnosis not present

## 2021-03-21 DIAGNOSIS — I10 Essential (primary) hypertension: Secondary | ICD-10-CM | POA: Diagnosis not present

## 2021-03-21 DIAGNOSIS — J449 Chronic obstructive pulmonary disease, unspecified: Secondary | ICD-10-CM | POA: Diagnosis not present

## 2021-03-21 DIAGNOSIS — Z85828 Personal history of other malignant neoplasm of skin: Secondary | ICD-10-CM | POA: Diagnosis not present

## 2021-03-21 DIAGNOSIS — N281 Cyst of kidney, acquired: Secondary | ICD-10-CM | POA: Diagnosis not present

## 2021-03-21 DIAGNOSIS — F5101 Primary insomnia: Secondary | ICD-10-CM | POA: Diagnosis not present

## 2021-03-24 DIAGNOSIS — I2510 Atherosclerotic heart disease of native coronary artery without angina pectoris: Secondary | ICD-10-CM | POA: Diagnosis not present

## 2021-03-24 DIAGNOSIS — I10 Essential (primary) hypertension: Secondary | ICD-10-CM | POA: Diagnosis not present

## 2021-03-24 DIAGNOSIS — E7800 Pure hypercholesterolemia, unspecified: Secondary | ICD-10-CM | POA: Diagnosis not present

## 2021-03-24 DIAGNOSIS — E1165 Type 2 diabetes mellitus with hyperglycemia: Secondary | ICD-10-CM | POA: Diagnosis not present

## 2021-04-12 DIAGNOSIS — J449 Chronic obstructive pulmonary disease, unspecified: Secondary | ICD-10-CM | POA: Diagnosis not present

## 2021-04-24 DIAGNOSIS — E1165 Type 2 diabetes mellitus with hyperglycemia: Secondary | ICD-10-CM | POA: Diagnosis not present

## 2021-04-24 DIAGNOSIS — I2510 Atherosclerotic heart disease of native coronary artery without angina pectoris: Secondary | ICD-10-CM | POA: Diagnosis not present

## 2021-04-24 DIAGNOSIS — I10 Essential (primary) hypertension: Secondary | ICD-10-CM | POA: Diagnosis not present

## 2021-04-24 DIAGNOSIS — E119 Type 2 diabetes mellitus without complications: Secondary | ICD-10-CM | POA: Diagnosis not present

## 2021-05-13 DIAGNOSIS — J449 Chronic obstructive pulmonary disease, unspecified: Secondary | ICD-10-CM | POA: Diagnosis not present

## 2021-05-24 DIAGNOSIS — E785 Hyperlipidemia, unspecified: Secondary | ICD-10-CM | POA: Diagnosis not present

## 2021-05-24 DIAGNOSIS — I2510 Atherosclerotic heart disease of native coronary artery without angina pectoris: Secondary | ICD-10-CM | POA: Diagnosis not present

## 2021-05-24 DIAGNOSIS — I10 Essential (primary) hypertension: Secondary | ICD-10-CM | POA: Diagnosis not present

## 2021-05-24 DIAGNOSIS — E1165 Type 2 diabetes mellitus with hyperglycemia: Secondary | ICD-10-CM | POA: Diagnosis not present

## 2021-05-27 ENCOUNTER — Other Ambulatory Visit: Payer: Self-pay | Admitting: Gastroenterology

## 2021-05-29 ENCOUNTER — Ambulatory Visit: Payer: Medicare Other | Admitting: Gastroenterology

## 2021-05-29 ENCOUNTER — Encounter: Payer: Self-pay | Admitting: Internal Medicine

## 2021-06-11 ENCOUNTER — Inpatient Hospital Stay (HOSPITAL_COMMUNITY): Payer: Medicare Other

## 2021-06-11 ENCOUNTER — Ambulatory Visit (HOSPITAL_COMMUNITY): Payer: Medicare Other

## 2021-06-12 DIAGNOSIS — J449 Chronic obstructive pulmonary disease, unspecified: Secondary | ICD-10-CM | POA: Diagnosis not present

## 2021-06-18 ENCOUNTER — Ambulatory Visit (HOSPITAL_COMMUNITY): Payer: Medicare Other | Admitting: Hematology

## 2021-06-19 ENCOUNTER — Encounter: Payer: Self-pay | Admitting: Pulmonary Disease

## 2021-06-19 ENCOUNTER — Other Ambulatory Visit: Payer: Self-pay

## 2021-06-19 ENCOUNTER — Ambulatory Visit: Payer: Medicare Other | Admitting: Pulmonary Disease

## 2021-06-19 VITALS — BP 120/88 | HR 92 | Temp 98.9°F | Ht 61.5 in | Wt 108.1 lb

## 2021-06-19 DIAGNOSIS — Z23 Encounter for immunization: Secondary | ICD-10-CM | POA: Diagnosis not present

## 2021-06-19 DIAGNOSIS — J449 Chronic obstructive pulmonary disease, unspecified: Secondary | ICD-10-CM

## 2021-06-19 DIAGNOSIS — J9611 Chronic respiratory failure with hypoxia: Secondary | ICD-10-CM

## 2021-06-19 MED ORDER — TRELEGY ELLIPTA 100-62.5-25 MCG/ACT IN AEPB: 1.0000 | INHALATION_SPRAY | Freq: Every day | RESPIRATORY_TRACT | 0 refills | Status: DC

## 2021-06-19 NOTE — Progress Notes (Signed)
Pulmonary, Critical Care, and Sleep Medicine  Chief Complaint  Patient presents with   Follow-up    Feels breathing is getting worse since last OV. 2L O2 sometimes uses 3L O2.     Past Surgical History:  She  has a past surgical history that includes Cholecystectomy; Cesarean section; Multiple tooth extractions; and Amputation (Left, 04/04/2019).  Past Medical History:  Squamous cell carcinoma nailbed, Renal cyst, OA, HTN, Macular degeneration, Pneumonia, Rheumatic fever, Shingles, COVID 13 October 2020  Constitutional:  BP 120/88   Pulse 92   Temp 98.9 F (37.2 C)   Ht 5' 1.5" (1.562 m)   Wt 108 lb 1.9 oz (49 kg)   SpO2 94% Comment: 2lpm  BMI 20.10 kg/m   Brief Summary:  Debbie [name] is a 75 y.o. female former smoker with COPD/emphysema and chronic hypoxic respiratory failure.      Subjective:   She is here with her daughter.  She feels like her breathing has been getting worse.  Has occasional cough with clear sputum.  No having wheeze, fever, chest pain, or leg swelling.  She is using 2 liters oxygen at night and some during the day when she feels like she needs it.  Has persistent clear nasal drainage.  Previous use of nose sprays caused burning, but she doesn't recall which nose sprays she used previously.  Physical Exam:   Appearance - thin, sitting in wheelchair wearing oxygen  ENMT - no sinus tenderness, no oral exudate, no LAN, Mallampati 3 airway, no stridor  Respiratory - decreased breath sounds bilaterally, no wheezing or rales  CV - s1s2 regular rate and rhythm, no murmurs  Ext - no clubbing, no edema  Skin - no rashes  Psych - normal mood and affect   Pulmonary testing:  PFT 01/13/13 >> FEV1 0.63 (28%), FEV1% 28, TLC 6.12 (130%), DLCO 28%, +BD  Chest Imaging:  CT angio chest 01/18/19 >> atherosclerosis, centrilobular emphysema, 8 mm RLL nodule CT angio chest 10/09/20 >> mod centrilobular and paraseptal emphysema, ATX lingula, scarring  RML and RLL, 4 mm nodule RLL no change, 8 mm nodule RLL no change  Sleep Tests:    Cardiac Tests:  Echo 10/08/20 >> EF 55 to 60%  Social History:  She  reports that she quit smoking about 30 years ago. Her smoking use included cigarettes. She has never used smokeless tobacco. She reports that she does not drink alcohol and does not use drugs.  Family History:  Her family history includes Cancer in her brother, father, and mother; Heart disease in her father.     Assessment/Plan:   COPD with emphysema. - wasn't able to tolerate breztri - will have her try sample of trelegy 100 in place of symbicort; she is to call office for refill if she feels like this works better - prn albuterol - high dose flu shot today - completed her handicap parking form   Chronic respiratory failure with hypoxia. - goal SpO2 > 90% - she uses 2 to 3 liters oxygen with exertion and sleep  Lt thumb nailbed squamous cell carcinoma. - followed by Dr. Delton Coombes with Oncology  Time Spent Involved in Patient Care on Day of Examination:  32 minutes  Follow up:   Patient Instructions  Try sample of trelegy one puff daily, and rinse your mouth after each use.  Call the office if you feel like trelegy is helping and you want a refill.  Don't use symbicort while using trelegy.  High  dose flu shot today.  Follow up in 4 months  Medication List:   Allergies as of 06/19/2021   No Known Allergies      Medication List        Accurate as of June 19, 2021 11:13 AM. If you have any questions, ask your nurse or doctor.          albuterol 108 (90 Base) MCG/ACT inhaler Commonly known as: VENTOLIN HFA Inhale 1 puff into the lungs every 6 (six) hours as needed for wheezing or shortness of breath.   dicyclomine 10 MG capsule Commonly known as: BENTYL TAKE ONE CAPSULE BY MOUTH THREE TIMES DAILY AS NEEDED FOR MUSCLE SPASMS   gabapentin 100 MG capsule Commonly known as: NEURONTIN Take 100 mg by  mouth 3 (three) times daily as needed.   ipratropium-albuterol 0.5-2.5 (3) MG/3ML Soln Commonly known as: DUONEB Take 3 mLs by nebulization 2 (two) times a day.   LORazepam 0.5 MG tablet Commonly known as: ATIVAN Take 0.25-0.5 mg by mouth at bedtime as needed.   metoprolol succinate 100 MG 24 hr tablet Commonly known as: TOPROL-XL Take 100 mg by mouth daily. Take with or immediately following a meal.   metoprolol succinate 25 MG 24 hr tablet Commonly known as: TOPROL-XL Take 1 tablet (25 mg total) by mouth daily. Take with metoprolol 100 mg once daily to make total of 125 mg   OXYGEN Inhale 2 L into the lungs daily.   pantoprazole 40 MG tablet Commonly known as: PROTONIX Take 40 mg by mouth daily.   Propylene Glycol 0.6 % Soln Place 2 drops into both eyes daily as needed (for dry eyes).   Symbicort 160-4.5 MCG/ACT inhaler Generic drug: budesonide-formoterol Inhale 2 puffs into the lungs 2 (two) times daily.   VISION-VITE PRESERVE PO Take by mouth daily.        Signature:  Chesley Mires, MD [place] Pager - [phone] 06/19/2021, 11:13 AM

## 2021-06-19 NOTE — Patient Instructions (Signed)
Try sample of trelegy one puff daily, and rinse your mouth after each use.  Call the office if you feel like trelegy is helping and you want a refill.  Don't use symbicort while using trelegy.  High dose flu shot today.  Follow up in 4 months

## 2021-06-19 NOTE — Addendum Note (Signed)
Addended by: Fritzi Mandes D on: 06/19/2021 12:01 PM   Modules accepted: Orders

## 2021-06-24 DIAGNOSIS — I2510 Atherosclerotic heart disease of native coronary artery without angina pectoris: Secondary | ICD-10-CM | POA: Diagnosis not present

## 2021-06-24 DIAGNOSIS — I10 Essential (primary) hypertension: Secondary | ICD-10-CM | POA: Diagnosis not present

## 2021-06-24 DIAGNOSIS — E782 Mixed hyperlipidemia: Secondary | ICD-10-CM | POA: Diagnosis not present

## 2021-06-24 DIAGNOSIS — E1165 Type 2 diabetes mellitus with hyperglycemia: Secondary | ICD-10-CM | POA: Diagnosis not present

## 2021-07-08 ENCOUNTER — Other Ambulatory Visit (HOSPITAL_COMMUNITY): Payer: Self-pay | Admitting: Internal Medicine

## 2021-07-08 DIAGNOSIS — Z1231 Encounter for screening mammogram for malignant neoplasm of breast: Secondary | ICD-10-CM

## 2021-07-13 DIAGNOSIS — J449 Chronic obstructive pulmonary disease, unspecified: Secondary | ICD-10-CM | POA: Diagnosis not present

## 2021-07-17 ENCOUNTER — Other Ambulatory Visit: Payer: Self-pay

## 2021-07-17 ENCOUNTER — Ambulatory Visit (HOSPITAL_COMMUNITY)
Admission: RE | Admit: 2021-07-17 | Discharge: 2021-07-17 | Disposition: A | Discharge status: Home / Self Care | Payer: Medicare Other | Source: Ambulatory Visit | Attending: Internal Medicine | Admitting: Internal Medicine

## 2021-07-17 DIAGNOSIS — Z1231 Encounter for screening mammogram for malignant neoplasm of breast: Secondary | ICD-10-CM | POA: Diagnosis not present

## 2021-07-24 ENCOUNTER — Other Ambulatory Visit: Payer: Self-pay | Admitting: Gastroenterology

## 2021-07-24 DIAGNOSIS — I10 Essential (primary) hypertension: Secondary | ICD-10-CM | POA: Diagnosis not present

## 2021-07-24 DIAGNOSIS — E1165 Type 2 diabetes mellitus with hyperglycemia: Secondary | ICD-10-CM | POA: Diagnosis not present

## 2021-07-24 DIAGNOSIS — I2510 Atherosclerotic heart disease of native coronary artery without angina pectoris: Secondary | ICD-10-CM | POA: Diagnosis not present

## 2021-07-24 DIAGNOSIS — E782 Mixed hyperlipidemia: Secondary | ICD-10-CM | POA: Diagnosis not present

## 2021-08-12 DIAGNOSIS — J449 Chronic obstructive pulmonary disease, unspecified: Secondary | ICD-10-CM | POA: Diagnosis not present

## 2021-08-23 DIAGNOSIS — E1165 Type 2 diabetes mellitus with hyperglycemia: Secondary | ICD-10-CM | POA: Diagnosis not present

## 2021-08-23 DIAGNOSIS — I2510 Atherosclerotic heart disease of native coronary artery without angina pectoris: Secondary | ICD-10-CM | POA: Diagnosis not present

## 2021-09-12 DIAGNOSIS — J449 Chronic obstructive pulmonary disease, unspecified: Secondary | ICD-10-CM | POA: Diagnosis not present

## 2021-09-22 DIAGNOSIS — I10 Essential (primary) hypertension: Secondary | ICD-10-CM | POA: Diagnosis not present

## 2021-09-22 DIAGNOSIS — E785 Hyperlipidemia, unspecified: Secondary | ICD-10-CM | POA: Diagnosis not present

## 2021-09-24 DIAGNOSIS — I10 Essential (primary) hypertension: Secondary | ICD-10-CM | POA: Diagnosis not present

## 2021-09-24 DIAGNOSIS — E1165 Type 2 diabetes mellitus with hyperglycemia: Secondary | ICD-10-CM | POA: Diagnosis not present

## 2021-09-24 DIAGNOSIS — E785 Hyperlipidemia, unspecified: Secondary | ICD-10-CM | POA: Diagnosis not present

## 2021-09-24 DIAGNOSIS — I2510 Atherosclerotic heart disease of native coronary artery without angina pectoris: Secondary | ICD-10-CM | POA: Diagnosis not present

## 2021-10-02 DIAGNOSIS — E0590 Thyrotoxicosis, unspecified without thyrotoxic crisis or storm: Secondary | ICD-10-CM | POA: Diagnosis not present

## 2021-10-02 DIAGNOSIS — I2510 Atherosclerotic heart disease of native coronary artery without angina pectoris: Secondary | ICD-10-CM | POA: Diagnosis not present

## 2021-10-13 DIAGNOSIS — J449 Chronic obstructive pulmonary disease, unspecified: Secondary | ICD-10-CM | POA: Diagnosis not present

## 2021-10-22 DIAGNOSIS — I10 Essential (primary) hypertension: Secondary | ICD-10-CM | POA: Diagnosis not present

## 2021-10-22 DIAGNOSIS — E1165 Type 2 diabetes mellitus with hyperglycemia: Secondary | ICD-10-CM | POA: Diagnosis not present

## 2021-10-22 DIAGNOSIS — E119 Type 2 diabetes mellitus without complications: Secondary | ICD-10-CM | POA: Diagnosis not present

## 2021-10-22 DIAGNOSIS — I2510 Atherosclerotic heart disease of native coronary artery without angina pectoris: Secondary | ICD-10-CM | POA: Diagnosis not present

## 2021-11-10 DIAGNOSIS — J449 Chronic obstructive pulmonary disease, unspecified: Secondary | ICD-10-CM | POA: Diagnosis not present

## 2021-11-11 ENCOUNTER — Other Ambulatory Visit: Payer: Self-pay

## 2021-11-11 DIAGNOSIS — E0590 Thyrotoxicosis, unspecified without thyrotoxic crisis or storm: Secondary | ICD-10-CM

## 2021-11-18 ENCOUNTER — Ambulatory Visit: Payer: Medicare Other | Admitting: "Endocrinology

## 2021-11-20 ENCOUNTER — Ambulatory Visit: Payer: Medicare Other | Admitting: Pulmonary Disease

## 2021-11-20 ENCOUNTER — Encounter: Payer: Self-pay | Admitting: Pulmonary Disease

## 2021-11-20 ENCOUNTER — Other Ambulatory Visit: Payer: Self-pay

## 2021-11-20 VITALS — BP 132/84 | HR 82 | Temp 98.7°F | Ht 62.0 in | Wt 104.6 lb

## 2021-11-20 DIAGNOSIS — J449 Chronic obstructive pulmonary disease, unspecified: Secondary | ICD-10-CM

## 2021-11-20 DIAGNOSIS — J9611 Chronic respiratory failure with hypoxia: Secondary | ICD-10-CM

## 2021-11-20 MED ORDER — TRELEGY ELLIPTA 100-62.5-25 MCG/ACT IN AEPB
1.0000 | INHALATION_SPRAY | Freq: Every day | RESPIRATORY_TRACT | 0 refills | Status: DC
Start: 2021-11-20 — End: 2022-01-22

## 2021-11-20 NOTE — Patient Instructions (Signed)
Try using your sample of trelegy one puff daily.  Rinse your mouth after each use of trelegy. ? ?Don't use symbicort while you are using trelegy. ? ?Call in 2 weeks to let us know if you feel that trelegy is working better, and we will send in a refill for trelegy.  If you don't think trelegy is working better, then resume using symbicort. ? ?Follow up in 6 months. ?

## 2021-11-20 NOTE — Progress Notes (Signed)
? ?[place]monary, Critical Care, and Sleep Medicine ? ?Chief Complaint  ?Patient presents with  ? Follow-up  ?  Breathing is about the same.  ?2Lo2 prn during day.   ? ? ?Past Surgical History:  ?She  has a past surgical history that includes Cholecystectomy; Cesarean section; Multiple tooth extractions; and Amputation (Left, 04/04/2019). ? ?Past Medical History:  ?Squamous cell carcinoma nailbed, Renal cyst, OA, HTN, Macular degeneration, Pneumonia, Rheumatic fever, Shingles, COVID 13 October 2020 ? ?Constitutional:  ?BP 132/84 (BP Location: Left Arm, Patient Position: Sitting)   Pulse 82   Temp 98.7 ?F (37.1 ?C) (Temporal)   Ht '5\' 2"'$  (1.575 m)   Wt 104 lb 9.6 oz (47.4 kg)   SpO2 92% Comment: ra  BMI 19.13 kg/m?  ? ?Brief Summary:  ?Debbie [name] is a 76 y.o. female former smoker with COPD/emphysema and chronic hypoxic respiratory failure. ?  ? ? ? ?Subjective:  ? ?She is here with her daughter. ? ?She didn't try trelegy sample.  She still has them.  She read label and was scared about the side effects.   ? ?He gets winded easily.  Needs to use her nebulizer twice per day.  Not having cough, wheeze, sputum, or leg swelling.  Sleeping okay.  On 2 liters oxygen. ? ?Physical Exam:  ? ?Appearance - well kempt, thin, wearing oxygen, in wheelchair ? ?ENMT - no sinus tenderness, no oral exudate, no LAN, Mallampati 3 airway, no stridor ? ?Respiratory - equal breath sounds bilaterally, no wheezing or rales ? ?CV - s1s2 regular rate and rhythm, no murmurs ? ?Ext - no clubbing, no edema ? ?Skin - no rashes ? ?Psych - normal mood and affect ? ?  ?Pulmonary testing:  ?PFT 01/13/13 >> FEV1 0.63 (28%), FEV1% 28, TLC 6.12 (130%), DLCO 28%, +BD ? ?Chest Imaging:  ?CT angio chest 01/18/19 >> atherosclerosis, centrilobular emphysema, 8 mm RLL nodule ?CT angio chest 10/09/20 >> mod centrilobular and paraseptal emphysema, ATX lingula, scarring RML and RLL, 4 mm nodule RLL no change, 8 mm nodule RLL no change ? ?Cardiac Tests:   ?Echo 10/08/20 >> EF 55 to 60% ? ?Social History:  ?She  reports that she quit smoking about 31 years ago. Her smoking use included cigarettes. She has never used smokeless tobacco. She reports that she does not drink alcohol and does not use drugs. ? ?Family History:  ?Her family history includes Cancer in her brother, father, and mother; Heart disease in her father. ?  ? ? ?Assessment/Plan:  ? ?COPD with emphysema. ?- wasn't able to tolerate breztri ?- discussed side effects from trelegy  ?- she is willing to try sample of trelegy and call if she wants a refill of this ?- she is to not use symbicort while using trelegy ?- she uses duoneb twice per day; will need to change nebulizer regimen if she is to continue trelegy ?- prn albuterol HFA ?  ?Chronic respiratory failure with hypoxia. ?- goal SpO2 > 90% ?- she uses 2 to 3 liters oxygen with exertion and sleep ? ?Lt thumb nailbed squamous cell carcinoma. ?- followed by Dr. Delton Coombes with Oncology ? ?Time Spent Involved in Patient Care on Day of Examination:  ?27 minutes ? ?Follow up:  ? ?Patient Instructions  ?Try using your sample of trelegy one puff daily.  Rinse your mouth after each use of trelegy. ? ?Don't use symbicort while you are using trelegy. ? ?Call in 2 weeks to let us know if you feel  that trelegy is working better, and we will send in a refill for trelegy.  If you don't think trelegy is working better, then resume using symbicort. ? ?Follow up in 6 months. ? ?Medication List:  ? ?Allergies as of 11/20/2021   ?No Known Allergies ?  ? ?  ?Medication List  ?  ? ?  ? Accurate as of November 20, 2021 10:52 AM. If you have any questions, ask your nurse or doctor.  ?  ?  ? ?  ? ?albuterol 108 (90 Base) MCG/ACT inhaler ?Commonly known as: VENTOLIN HFA ?Inhale 1 puff into the lungs every 6 (six) hours as needed for wheezing or shortness of breath. ?  ?dicyclomine 10 MG capsule ?Commonly known as: BENTYL ?TAKE ONE CAPSULE BY MOUTH THREE TIMES DAILY AS NEEDED FOR  MUSCLE SPASMS ?  ?gabapentin 100 MG capsule ?Commonly known as: NEURONTIN ?Take 100 mg by mouth 3 (three) times daily as needed. ?  ?ipratropium-albuterol 0.5-2.5 (3) MG/3ML Soln ?Commonly known as: DUONEB ?Take 3 mLs by nebulization 2 (two) times a day. ?  ?LORazepam 0.5 MG tablet ?Commonly known as: ATIVAN ?Take 0.25-0.5 mg by mouth at bedtime as needed. ?  ?metoprolol succinate 100 MG 24 hr tablet ?Commonly known as: TOPROL-XL ?Take 100 mg by mouth daily. Take with or immediately following a meal. ?What changed: Another medication with the same name was removed. Continue taking this medication, and follow the directions you see here. ?Changed by: Chesley Mires, MD ?  ?OXYGEN ?Inhale 2 L into the lungs daily. ?  ?pantoprazole 40 MG tablet ?Commonly known as: PROTONIX ?Take 40 mg by mouth daily. ?  ?Propylene Glycol 0.6 % Soln ?Place 2 drops into both eyes daily as needed (for dry eyes). ?  ?Symbicort 160-4.5 MCG/ACT inhaler ?Generic drug: budesonide-formoterol ?Inhale 2 puffs into the lungs 2 (two) times daily. ?  ?Trelegy Ellipta 100-62.5-25 MCG/ACT Aepb ?Generic drug: Fluticasone-Umeclidin-Vilant ?Inhale 1 puff into the lungs daily. ?  ?VISION-VITE PRESERVE PO ?Take by mouth daily. ?  ? ?  ? ? ?Signature:  ?Chesley Mires, MD ?[place] ?Pager - [phone] - [phone] ?11/20/2021, 10:52 AM ?  ? ? ? ? ? ? ? ? ?

## 2021-11-20 NOTE — Addendum Note (Signed)
Addended by: Fritzi Mandes D on: 11/20/2021 11:14 AM ? ? Modules accepted: Orders ? ?

## 2021-11-27 DIAGNOSIS — T148XXA Other injury of unspecified body region, initial encounter: Secondary | ICD-10-CM | POA: Diagnosis not present

## 2021-11-27 DIAGNOSIS — S91301A Unspecified open wound, right foot, initial encounter: Secondary | ICD-10-CM | POA: Diagnosis not present

## 2021-12-08 ENCOUNTER — Other Ambulatory Visit: Payer: Self-pay | Admitting: Gastroenterology

## 2021-12-09 NOTE — Telephone Encounter (Signed)
Last ov 12/05/20 ?

## 2021-12-11 DIAGNOSIS — J449 Chronic obstructive pulmonary disease, unspecified: Secondary | ICD-10-CM | POA: Diagnosis not present

## 2021-12-22 DIAGNOSIS — E782 Mixed hyperlipidemia: Secondary | ICD-10-CM | POA: Diagnosis not present

## 2021-12-22 DIAGNOSIS — J449 Chronic obstructive pulmonary disease, unspecified: Secondary | ICD-10-CM | POA: Diagnosis not present

## 2021-12-22 DIAGNOSIS — I10 Essential (primary) hypertension: Secondary | ICD-10-CM | POA: Diagnosis not present

## 2021-12-22 DIAGNOSIS — I2510 Atherosclerotic heart disease of native coronary artery without angina pectoris: Secondary | ICD-10-CM | POA: Diagnosis not present

## 2022-01-08 ENCOUNTER — Encounter (HOSPITAL_COMMUNITY): Payer: Self-pay | Admitting: Physical Therapy

## 2022-01-08 ENCOUNTER — Ambulatory Visit (HOSPITAL_COMMUNITY): Payer: Medicare Other | Attending: Nurse Practitioner | Admitting: Physical Therapy

## 2022-01-08 DIAGNOSIS — S91301S Unspecified open wound, right foot, sequela: Secondary | ICD-10-CM | POA: Diagnosis not present

## 2022-01-08 DIAGNOSIS — R2689 Other abnormalities of gait and mobility: Secondary | ICD-10-CM | POA: Diagnosis not present
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
## 2022-01-10 DIAGNOSIS — J449 Chronic obstructive pulmonary disease, unspecified: Secondary | ICD-10-CM | POA: Diagnosis not present

## 2022-01-15 ENCOUNTER — Inpatient Hospital Stay (HOSPITAL_COMMUNITY): Payer: Medicare Other | Attending: Hematology

## 2022-01-15 ENCOUNTER — Encounter (HOSPITAL_COMMUNITY)
Admission: RE | Admit: 2022-01-15 | Discharge: 2022-01-15 | Disposition: A | Discharge status: Home / Self Care | Payer: Medicare Other | Source: Home / Self Care | Attending: Hematology | Admitting: Hematology

## 2022-01-15 ENCOUNTER — Ambulatory Visit (HOSPITAL_COMMUNITY): Payer: Medicare Other | Admitting: Physical Therapy

## 2022-01-15 ENCOUNTER — Encounter (HOSPITAL_COMMUNITY): Payer: Self-pay | Admitting: Physical Therapy

## 2022-01-15 DIAGNOSIS — N281 Cyst of kidney, acquired: Secondary | ICD-10-CM | POA: Insufficient documentation

## 2022-01-15 DIAGNOSIS — S91301S Unspecified open wound, right foot, sequela: Secondary | ICD-10-CM | POA: Diagnosis not present

## 2022-01-15 DIAGNOSIS — Q6102 Congenital multiple renal cysts: Secondary | ICD-10-CM

## 2022-01-15 DIAGNOSIS — C44609 Unspecified malignant neoplasm of skin of left upper limb, including shoulder: Secondary | ICD-10-CM | POA: Insufficient documentation

## 2022-01-15 DIAGNOSIS — Z85828 Personal history of other malignant neoplasm of skin: Secondary | ICD-10-CM | POA: Insufficient documentation

## 2022-01-15 DIAGNOSIS — R2689 Other abnormalities of gait and mobility: Secondary | ICD-10-CM

## 2022-01-15 LAB — COMPREHENSIVE METABOLIC PANEL
ALT: 16 U/L (ref 0–44)
AST: 21 U/L (ref 15–41)
Albumin: 4.4 g/dL (ref 3.5–5.0)
Alkaline Phosphatase: 79 U/L (ref 38–126)
Anion gap: 5 (ref 5–15)
BUN: 10 mg/dL (ref 8–23)
CO2: 29 mmol/L (ref 22–32)
Calcium: 9.2 mg/dL (ref 8.9–10.3)
Chloride: 106 mmol/L (ref 98–111)
Creatinine, Ser: 0.58 mg/dL (ref 0.44–1.00)
GFR, Estimated: >60 mL/min (ref >60)
Glucose, Bld: 105 mg/dL — ABNORMAL HIGH (ref 70–99)
Potassium: 3.9 mmol/L (ref 3.5–5.1)
Sodium: 140 mmol/L (ref 135–145)
Total Bilirubin: 0.7 mg/dL (ref 0.3–1.2)
Total Protein: 7.1 g/dL (ref 6.5–8.1)

## 2022-01-15 LAB — CBC WITH DIFFERENTIAL/PLATELET
Abs Immature Granulocytes: 0.01 10*3/uL (ref 0.00–0.07)
Basophils Absolute: 0 10*3/uL (ref 0.0–0.1)
Basophils Relative: 0 %
Eosinophils Absolute: 0.1 10*3/uL (ref 0.0–0.5)
Eosinophils Relative: 1 %
HCT: 41.6 % (ref 36.0–46.0)
Hemoglobin: 13.6 g/dL (ref 12.0–15.0)
Immature Granulocytes: 0 %
Lymphocytes Relative: 34 %
Lymphs Abs: 2.4 10*3/uL (ref 0.7–4.0)
MCH: 31.1 pg (ref 26.0–34.0)
MCHC: 32.7 g/dL (ref 30.0–36.0)
MCV: 95 fL (ref 80.0–100.0)
Monocytes Absolute: 0.4 10*3/uL (ref 0.1–1.0)
Monocytes Relative: 5 %
Neutro Abs: 4.2 10*3/uL (ref 1.7–7.7)
Neutrophils Relative %: 60 %
Platelets: 230 10*3/uL (ref 150–400)
RBC: 4.38 MIL/uL (ref 3.87–5.11)
RDW: 13.5 % (ref 11.5–15.5)
WBC: 7.1 10*3/uL (ref 4.0–10.5)
nRBC: 0 % (ref 0.0–0.2)

## 2022-01-15 LAB — LACTATE DEHYDROGENASE: LDH: 135 U/L (ref 98–192)
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
## 2022-01-22 ENCOUNTER — Inpatient Hospital Stay (HOSPITAL_COMMUNITY): Payer: Medicare Other | Admitting: Hematology

## 2022-01-22 ENCOUNTER — Encounter (HOSPITAL_COMMUNITY): Payer: Self-pay | Admitting: Physical Therapy

## 2022-01-22 ENCOUNTER — Ambulatory Visit (HOSPITAL_COMMUNITY): Payer: Medicare Other | Attending: Internal Medicine | Admitting: Physical Therapy

## 2022-01-22 VITALS — BP 149/67 | HR 92 | Temp 97.6°F | Resp 18 | Ht 59.65 in | Wt 105.2 lb

## 2022-01-22 DIAGNOSIS — S91301S Unspecified open wound, right foot, sequela: Secondary | ICD-10-CM | POA: Diagnosis not present

## 2022-01-22 DIAGNOSIS — Q6102 Congenital multiple renal cysts: Secondary | ICD-10-CM

## 2022-01-22 DIAGNOSIS — Z85828 Personal history of other malignant neoplasm of skin: Secondary | ICD-10-CM | POA: Diagnosis not present

## 2022-01-22 DIAGNOSIS — X58XXXS Exposure to other specified factors, sequela: Secondary | ICD-10-CM | POA: Insufficient documentation

## 2022-01-22 DIAGNOSIS — C44609 Unspecified malignant neoplasm of skin of left upper limb, including shoulder: Secondary | ICD-10-CM

## 2022-01-22 DIAGNOSIS — N281 Cyst of kidney, acquired: Secondary | ICD-10-CM | POA: Diagnosis not present

## 2022-01-22 DIAGNOSIS — R2689 Other abnormalities of gait and mobility: Secondary | ICD-10-CM | POA: Diagnosis not present
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
## 2022-01-22 NOTE — Patient Instructions (Addendum)
[place] at [facility] Discharge Instructions  You were seen and examined today by Dr. Delton Coombes.  Dr. Delton Coombes discussed your most recent lab work which is stable. There is no clinical necessity for a repeat CT scan at this time.  You should have an ultrasound of your kidney due to the known cysts on your kidney.  Follow-up as scheduled.    Thank you for choosing [place] at [facility] to provide your oncology and hematology care.  To afford each patient quality time with our provider, please arrive at least 15 minutes before your scheduled appointment time.   If you have a lab appointment with the [facility] please come in thru the Main Entrance and check in at the main information desk.  You need to re-schedule your appointment should you arrive 10 or more minutes late.  We strive to give you quality time with our providers, and arriving late affects you and other patients whose appointments are after yours.  Also, if you no show three or more times for appointments you may be dismissed from the clinic at the providers discretion.     Again, thank you for choosing [facility].  Our hope is that these requests will decrease the amount of time that you wait before being seen by our physicians.       _____________________________________________________________  Should you have questions after your visit to [facility], please contact our office at [phone] and follow the prompts.  Our office hours are 8:00 a.m. and 4:30 p.m. Monday - Friday.  Please note that voicemails left after 4:00 p.m. may not be returned until the following business day.  We are closed weekends and major holidays.  You do have access to a nurse 24-7, just call the main number to the clinic [phone] and do not press any options, hold on the line and a nurse will answer the phone.    For prescription refill requests, have your  pharmacy contact our office and allow 72 hours.    Due to Covid, you will need to wear a mask upon entering the hospital. If you do not have a mask, a mask will be given to you at the Main Entrance upon arrival. For doctor visits, patients may have 1 support person age 63 or older with them. For treatment visits, patients can not have anyone with them due to social distancing guidelines and our immunocompromised population.
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
## 2022-01-29 ENCOUNTER — Ambulatory Visit (HOSPITAL_COMMUNITY): Payer: Medicare Other | Attending: Internal Medicine

## 2022-01-29 ENCOUNTER — Encounter (HOSPITAL_COMMUNITY): Payer: Self-pay

## 2022-01-29 DIAGNOSIS — S91301S Unspecified open wound, right foot, sequela: Secondary | ICD-10-CM | POA: Diagnosis not present

## 2022-01-29 DIAGNOSIS — R2689 Other abnormalities of gait and mobility: Secondary | ICD-10-CM | POA: Insufficient documentation
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
## 2022-02-05 ENCOUNTER — Ambulatory Visit (HOSPITAL_COMMUNITY): Payer: Medicare Other | Admitting: Physical Therapy

## 2022-02-05 ENCOUNTER — Ambulatory Visit (HOSPITAL_COMMUNITY)
Admission: RE | Admit: 2022-02-05 | Discharge: 2022-02-05 | Disposition: A | Discharge status: Home / Self Care | Payer: Medicare Other | Source: Ambulatory Visit | Attending: Hematology | Admitting: Hematology

## 2022-02-05 ENCOUNTER — Encounter (HOSPITAL_COMMUNITY): Payer: Self-pay | Admitting: Physical Therapy

## 2022-02-05 DIAGNOSIS — S91301S Unspecified open wound, right foot, sequela: Secondary | ICD-10-CM | POA: Diagnosis not present

## 2022-02-05 DIAGNOSIS — R2689 Other abnormalities of gait and mobility: Secondary | ICD-10-CM

## 2022-02-05 DIAGNOSIS — Q6102 Congenital multiple renal cysts: Secondary | ICD-10-CM | POA: Diagnosis not present

## 2022-02-05 DIAGNOSIS — N281 Cyst of kidney, acquired: Secondary | ICD-10-CM | POA: Diagnosis not present
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
## 2022-02-10 DIAGNOSIS — J449 Chronic obstructive pulmonary disease, unspecified: Secondary | ICD-10-CM | POA: Diagnosis not present

## 2022-02-11 ENCOUNTER — Inpatient Hospital Stay (HOSPITAL_COMMUNITY): Payer: Medicare Other | Attending: Hematology | Admitting: Hematology

## 2022-02-11 DIAGNOSIS — C44609 Unspecified malignant neoplasm of skin of left upper limb, including shoulder: Secondary | ICD-10-CM

## 2022-02-11 NOTE — Progress Notes (Signed)
Virtual Visit via Telephone Note  I connected with Debbie [name] on 02/11/22 at  3:30 PM EDT by telephone and verified that I am speaking with the correct person using two identifiers.  Location: Patient: At home Provider: In the office   I discussed the limitations, risks, security and privacy concerns of performing an evaluation and management service by telephone and the availability of in person appointments. I also discussed with the patient that there may be a patient responsible charge related to this service. The patient expressed understanding and agreed to proceed.   History of Present Illness: This patient is followed in our clinic for left thumb nailbed squamous cell carcinoma, excision in the left thumb amputation on 04/04/2019.   Observations/Objective: At last visit on 01/22/2022, she did not experience any new pains or symptoms or signs of recurrence.  She does not report any hematuria.  She stopped drinking soda and drinking mainly water.  Assessment and Plan:  1.  Right kidney cyst: - I have reviewed renal ultrasound from 02/05/2022.  This was compared to CT scan from 01/14/2019.  2 complex septated cystic lesions in the right kidney have not changed significantly compared to CT scan from 3 years ago indicating complex cysts. - We will follow-up on it and subsequent scans.  2.  Left thumb nailbed SCC: - She already has a follow-up appointment in 1 year.   Follow Up Instructions: RTC 1 year with labs.   I discussed the assessment and treatment plan with the patient. The patient was provided an opportunity to ask questions and all were answered. The patient agreed with the plan and demonstrated an understanding of the instructions.   The patient was advised to call back or seek an in-person evaluation if the symptoms worsen or if the condition fails to improve as anticipated.  I provided 21 minutes of non-face-to-face time during this encounter.   Derek Jack,  MD

## 2022-02-12 ENCOUNTER — Ambulatory Visit (HOSPITAL_COMMUNITY): Payer: Medicare Other | Attending: Internal Medicine | Admitting: Physical Therapy

## 2022-02-12 ENCOUNTER — Encounter (HOSPITAL_COMMUNITY): Payer: Self-pay | Admitting: Physical Therapy

## 2022-02-12 DIAGNOSIS — R2689 Other abnormalities of gait and mobility: Secondary | ICD-10-CM | POA: Insufficient documentation

## 2022-02-12 DIAGNOSIS — S91301S Unspecified open wound, right foot, sequela: Secondary | ICD-10-CM | POA: Diagnosis not present
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
## 2022-02-13 DIAGNOSIS — C44722 Squamous cell carcinoma of skin of right lower limb, including hip: Secondary | ICD-10-CM | POA: Diagnosis not present

## 2022-02-19 ENCOUNTER — Ambulatory Visit (HOSPITAL_COMMUNITY): Payer: Medicare Other

## 2022-02-20 ENCOUNTER — Encounter (HOSPITAL_COMMUNITY): Payer: Self-pay | Admitting: Physical Therapy

## 2022-02-20 NOTE — Therapy (Signed)
[place], [place], [address] Phone: [phone]   Fax:  [phone]  Patient Details  Name: [name] MRN: [id number] Date of Birth: 07/27/1946 Referring Provider:  No ref. provider found  Encounter Date: 02/20/2022   PHYSICAL THERAPY DISCHARGE SUMMARY  Visits from Start of Care: 6  Current functional level related to goals / functional outcomes: Per front office, "this pt wants to be d/c. pt called to cx this appt due to she feels that she does not need this therapy" Patient with continued wounds present as of last visit leading to goals not met at this time.   Remaining deficits: Continued wound   Education / Equipment: Educated on f/u with dermatologist    Patient agrees to discharge. Patient goals were not met. Patient is being discharged due to the patient's request.   9:42 AM, 02/20/22 Mearl Latin PT, DPT Physical Therapist at [place], [place], [address] Phone: [phone]   Fax:  [phone]

## 2022-02-21 DIAGNOSIS — J449 Chronic obstructive pulmonary disease, unspecified: Secondary | ICD-10-CM | POA: Diagnosis not present

## 2022-02-21 DIAGNOSIS — I10 Essential (primary) hypertension: Secondary | ICD-10-CM | POA: Diagnosis not present

## 2022-02-21 DIAGNOSIS — E782 Mixed hyperlipidemia: Secondary | ICD-10-CM | POA: Diagnosis not present

## 2022-02-21 DIAGNOSIS — I2510 Atherosclerotic heart disease of native coronary artery without angina pectoris: Secondary | ICD-10-CM | POA: Diagnosis not present

## 2022-03-12 DIAGNOSIS — J449 Chronic obstructive pulmonary disease, unspecified: Secondary | ICD-10-CM | POA: Diagnosis not present

## 2022-03-13 DIAGNOSIS — Z08 Encounter for follow-up examination after completed treatment for malignant neoplasm: Secondary | ICD-10-CM | POA: Diagnosis not present

## 2022-03-13 DIAGNOSIS — Z85828 Personal history of other malignant neoplasm of skin: Secondary | ICD-10-CM | POA: Diagnosis not present

## 2022-03-13 DIAGNOSIS — C44722 Squamous cell carcinoma of skin of right lower limb, including hip: Secondary | ICD-10-CM | POA: Diagnosis not present

## 2022-03-13 DIAGNOSIS — D0439 Carcinoma in situ of skin of other parts of face: Secondary | ICD-10-CM | POA: Diagnosis not present

## 2022-03-26 DIAGNOSIS — I10 Essential (primary) hypertension: Secondary | ICD-10-CM | POA: Diagnosis not present

## 2022-03-26 DIAGNOSIS — E0590 Thyrotoxicosis, unspecified without thyrotoxic crisis or storm: Secondary | ICD-10-CM | POA: Diagnosis not present

## 2022-04-04 ENCOUNTER — Other Ambulatory Visit: Payer: Self-pay | Admitting: Gastroenterology

## 2022-04-09 DIAGNOSIS — E0590 Thyrotoxicosis, unspecified without thyrotoxic crisis or storm: Secondary | ICD-10-CM | POA: Diagnosis not present

## 2022-04-09 DIAGNOSIS — J449 Chronic obstructive pulmonary disease, unspecified: Secondary | ICD-10-CM | POA: Diagnosis not present

## 2022-04-09 DIAGNOSIS — F5101 Primary insomnia: Secondary | ICD-10-CM | POA: Diagnosis not present

## 2022-04-09 DIAGNOSIS — M25511 Pain in right shoulder: Secondary | ICD-10-CM | POA: Diagnosis not present

## 2022-04-09 DIAGNOSIS — I10 Essential (primary) hypertension: Secondary | ICD-10-CM | POA: Diagnosis not present

## 2022-04-09 DIAGNOSIS — N281 Cyst of kidney, acquired: Secondary | ICD-10-CM | POA: Diagnosis not present

## 2022-04-09 DIAGNOSIS — Z0001 Encounter for general adult medical examination with abnormal findings: Secondary | ICD-10-CM | POA: Diagnosis not present

## 2022-04-09 DIAGNOSIS — E782 Mixed hyperlipidemia: Secondary | ICD-10-CM | POA: Diagnosis not present

## 2022-04-09 DIAGNOSIS — Z85828 Personal history of other malignant neoplasm of skin: Secondary | ICD-10-CM | POA: Diagnosis not present

## 2022-04-09 DIAGNOSIS — Z0000 Encounter for general adult medical examination without abnormal findings: Secondary | ICD-10-CM | POA: Diagnosis not present

## 2022-04-09 DIAGNOSIS — I2510 Atherosclerotic heart disease of native coronary artery without angina pectoris: Secondary | ICD-10-CM | POA: Diagnosis not present

## 2022-04-12 DIAGNOSIS — J449 Chronic obstructive pulmonary disease, unspecified: Secondary | ICD-10-CM | POA: Diagnosis not present

## 2022-04-24 DIAGNOSIS — Z85828 Personal history of other malignant neoplasm of skin: Secondary | ICD-10-CM | POA: Diagnosis not present

## 2022-04-24 DIAGNOSIS — Z08 Encounter for follow-up examination after completed treatment for malignant neoplasm: Secondary | ICD-10-CM | POA: Diagnosis not present

## 2022-04-30 ENCOUNTER — Ambulatory Visit: Payer: Medicare Other | Admitting: Pulmonary Disease

## 2022-04-30 ENCOUNTER — Encounter: Payer: Self-pay | Admitting: Pulmonary Disease

## 2022-04-30 VITALS — BP 122/68 | HR 82 | Temp 97.6°F | Ht 59.5 in | Wt 106.0 lb

## 2022-04-30 DIAGNOSIS — J449 Chronic obstructive pulmonary disease, unspecified: Secondary | ICD-10-CM

## 2022-04-30 DIAGNOSIS — J9611 Chronic respiratory failure with hypoxia: Secondary | ICD-10-CM | POA: Diagnosis not present

## 2022-04-30 MED ORDER — ALBUTEROL SULFATE (2.5 MG/3ML) 0.083% IN NEBU: 2.5000 mg | INHALATION_SOLUTION | Freq: Four times a day (QID) | RESPIRATORY_TRACT | 5 refills | Status: AC | PRN

## 2022-04-30 NOTE — Progress Notes (Signed)
[facility]  Chief Complaint  Patient presents with   Follow-up    Feels breathing is a little better since last ov. Trelegy inhaler is working well.     Past Surgical History:  She  has a past surgical history that includes Cholecystectomy; Cesarean section; Multiple tooth extractions; and Amputation (Left, 04/04/2019).  Past Medical History:  Squamous cell carcinoma nailbed, Renal cyst, OA, HTN, Macular degeneration, Pneumonia, Rheumatic fever, Shingles, COVID 13 October 2020  Constitutional:  BP 122/68 (BP Location: Left Arm, Patient Position: Sitting)   Pulse 82   Temp 97.6 F (36.4 C) (Temporal)   Ht 4' 11.5" (1.511 m)   Wt 106 lb (48.1 kg)   SpO2 (!) 89% Comment: ra  BMI 21.05 kg/m   Brief Summary:  Debbie [name] is a 76 y.o. female former smoker with COPD/emphysema and chronic hypoxic respiratory failure.      Subjective:   She is here with her daughter.  Breathing improved since using trelegy.  Not having cough, wheeze, sputum.  Sleeping okay.  Gets winded with any type of activity.  Hasn't been using supplemental oxygen consistently.  Hasn't needed albuterol or her nebulizer much since last visit.  She plans to get flu vaccine in October.    Physical Exam:   Appearance - well kempt, sitting in her wheelchair  ENMT - no sinus tenderness, no oral exudate, no LAN, Mallampati 3 airway, no stridor  Respiratory - decreased breath sounds bilaterally, no wheezing or rales  CV - s1s2 regular rate and rhythm, no murmurs  Ext - no clubbing, no edema  Skin - no rashes  Psych - normal mood and affect     Pulmonary testing:  PFT 01/13/13 >> FEV1 0.63 (28%), FEV1% 28, TLC 6.12 (130%), DLCO 28%, +BD  Chest Imaging:  CT angio chest 01/18/19 >> atherosclerosis, centrilobular emphysema, 8 mm RLL nodule CT angio chest 10/09/20 >> mod centrilobular and paraseptal emphysema, ATX lingula, scarring RML and RLL, 4 mm nodule RLL no  change, 8 mm nodule RLL no change  Cardiac Tests:  [facility] 10/08/20 >> EF 55 to 60%  Social History:  She  reports that she quit smoking about 31 years ago. Her smoking use included cigarettes. She has never used smokeless tobacco. She reports that she does not drink alcohol and does not use drugs.  Family History:  Her family history includes Cancer in her brother, father, and mother; Heart disease in her father.     Assessment/Plan:   COPD with emphysema. - wasn't able to tolerate breztri - continue trelegy 100 one puff daily - prn albuterol - she has a nebulizer machine; will change to albuterol without ipratropium in her nebulizer since she is now using a LAMA in trelegy - she will f/u her PCP for influenza and RSV vaccinations; she is not planning to get COVID booster   Chronic respiratory failure with hypoxia. - goal SpO2 > 90% - she uses 2 to 3 liters oxygen with exertion and sleep - discussed the importance of using her supplemental oxygen as prescribed  Lt thumb nailbed squamous cell carcinoma. - followed by Dr. Delton Coombes with Oncology  Time Spent Involved in Patient Care on Day of Examination:  26 minutes  Follow up:   Patient Instructions  Follow up in 6 months  Medication List:   Allergies as of 04/30/2022   No Known Allergies      Medication List  Accurate as of April 30, 2022 11:20 AM. If you have any questions, ask your nurse or doctor.          STOP taking these medications    ipratropium-albuterol 0.5-2.5 (3) MG/3ML Soln Commonly known as: DUONEB Stopped by: Chesley Mires, MD   OXYGEN Stopped by: Chesley Mires, MD   VISION-VITE PRESERVE PO Stopped by: Chesley Mires, MD       TAKE these medications    albuterol 108 (90 Base) MCG/ACT inhaler Commonly known as: VENTOLIN HFA Inhale 1 puff into the lungs every 6 (six) hours as needed for wheezing or shortness of breath. What changed: Another medication with the same name was added.  Make sure you understand how and when to take each. Changed by: Chesley Mires, MD   albuterol (2.5 MG/3ML) 0.083% nebulizer solution Commonly known as: PROVENTIL Take 3 mLs (2.5 mg total) by nebulization every 6 (six) hours as needed for wheezing or shortness of breath. What changed: You were already taking a medication with the same name, and this prescription was added. Make sure you understand how and when to take each. Changed by: Chesley Mires, MD   dicyclomine 10 MG capsule Commonly known as: BENTYL TAKE ONE CAPSULE BY MOUTH THREE TIMES DAILY AS NEEDED FOR MUSCLE SPASMS   gabapentin 100 MG capsule Commonly known as: NEURONTIN Take 100 mg by mouth 3 (three) times daily as needed.   LORazepam 0.5 MG tablet Commonly known as: ATIVAN Take 0.25-0.5 mg by mouth at bedtime as needed.   metoprolol succinate 100 MG 24 hr tablet Commonly known as: TOPROL-XL Take 100 mg by mouth daily. Take with or immediately following a meal.   pantoprazole 40 MG tablet Commonly known as: PROTONIX Take 40 mg by mouth daily.   Propylene Glycol 0.6 % Soln Place 2 drops into both eyes daily as needed (for dry eyes).   Trelegy Ellipta 100-62.5-25 MCG/ACT Aepb Generic drug: Fluticasone-Umeclidin-Vilant Inhale 1 puff into the lungs daily.        Signature:  Chesley Mires, MD [place] Pager - [phone] 04/30/2022, 11:20 AM

## 2022-04-30 NOTE — Patient Instructions (Signed)
Follow up in 6 months 

## 2022-05-13 DIAGNOSIS — J449 Chronic obstructive pulmonary disease, unspecified: Secondary | ICD-10-CM | POA: Diagnosis not present

## 2022-06-12 DIAGNOSIS — J449 Chronic obstructive pulmonary disease, unspecified: Secondary | ICD-10-CM | POA: Diagnosis not present

## 2022-06-18 DIAGNOSIS — Z23 Encounter for immunization: Secondary | ICD-10-CM | POA: Diagnosis not present

## 2022-07-13 DIAGNOSIS — J449 Chronic obstructive pulmonary disease, unspecified: Secondary | ICD-10-CM | POA: Diagnosis not present

## 2022-08-12 DIAGNOSIS — J449 Chronic obstructive pulmonary disease, unspecified: Secondary | ICD-10-CM | POA: Diagnosis not present

## 2022-08-28 DIAGNOSIS — I2510 Atherosclerotic heart disease of native coronary artery without angina pectoris: Secondary | ICD-10-CM | POA: Diagnosis not present

## 2022-08-28 DIAGNOSIS — E0590 Thyrotoxicosis, unspecified without thyrotoxic crisis or storm: Secondary | ICD-10-CM | POA: Diagnosis not present

## 2022-09-03 ENCOUNTER — Other Ambulatory Visit (HOSPITAL_COMMUNITY): Payer: Self-pay | Admitting: Internal Medicine

## 2022-09-03 DIAGNOSIS — N281 Cyst of kidney, acquired: Secondary | ICD-10-CM | POA: Diagnosis not present

## 2022-09-03 DIAGNOSIS — E0590 Thyrotoxicosis, unspecified without thyrotoxic crisis or storm: Secondary | ICD-10-CM | POA: Diagnosis not present

## 2022-09-03 DIAGNOSIS — F5101 Primary insomnia: Secondary | ICD-10-CM | POA: Diagnosis not present

## 2022-09-03 DIAGNOSIS — I10 Essential (primary) hypertension: Secondary | ICD-10-CM | POA: Diagnosis not present

## 2022-09-03 DIAGNOSIS — I2510 Atherosclerotic heart disease of native coronary artery without angina pectoris: Secondary | ICD-10-CM | POA: Diagnosis not present

## 2022-09-03 DIAGNOSIS — Z13820 Encounter for screening for osteoporosis: Secondary | ICD-10-CM

## 2022-09-03 DIAGNOSIS — Z85828 Personal history of other malignant neoplasm of skin: Secondary | ICD-10-CM | POA: Diagnosis not present

## 2022-09-03 DIAGNOSIS — E782 Mixed hyperlipidemia: Secondary | ICD-10-CM | POA: Diagnosis not present

## 2022-09-03 DIAGNOSIS — J449 Chronic obstructive pulmonary disease, unspecified: Secondary | ICD-10-CM | POA: Diagnosis not present

## 2022-09-03 DIAGNOSIS — M25511 Pain in right shoulder: Secondary | ICD-10-CM | POA: Diagnosis not present

## 2022-09-03 DIAGNOSIS — K219 Gastro-esophageal reflux disease without esophagitis: Secondary | ICD-10-CM | POA: Diagnosis not present

## 2022-09-03 DIAGNOSIS — Z1231 Encounter for screening mammogram for malignant neoplasm of breast: Secondary | ICD-10-CM

## 2022-09-12 DIAGNOSIS — J449 Chronic obstructive pulmonary disease, unspecified: Secondary | ICD-10-CM | POA: Diagnosis not present

## 2022-09-22 ENCOUNTER — Other Ambulatory Visit: Payer: Self-pay | Admitting: Gastroenterology

## 2022-09-23 NOTE — Telephone Encounter (Signed)
Due ov.

## 2022-10-08 ENCOUNTER — Ambulatory Visit (HOSPITAL_COMMUNITY): Payer: Medicare Other

## 2022-10-08 ENCOUNTER — Other Ambulatory Visit (HOSPITAL_COMMUNITY): Payer: Medicare Other

## 2022-10-13 DIAGNOSIS — J449 Chronic obstructive pulmonary disease, unspecified: Secondary | ICD-10-CM | POA: Diagnosis not present

## 2022-10-17 NOTE — Progress Notes (Deleted)
GI Office Note    Referring Provider: Celene Squibb, MD Primary Care Physician:  Celene Squibb, MD  Primary Gastroenterologist: Elon Alas. Abbey Chatters, DO   Chief Complaint   No chief complaint on file.   History of Present Illness   Debbie [name] is a 77 y.o. female presenting today for follow up for refills of bentyl. Last seen in 11/2020 for GERD, RLQ pain. RLQ pain controlled with gabapentin (per PCP) and dicyclomine for breakthrough pain. Patient declined colonoscopy.         Medications   Current Outpatient Medications  Medication Sig Dispense Refill   albuterol (PROVENTIL HFA;VENTOLIN HFA) 108 (90 Base) MCG/ACT inhaler Inhale 1 puff into the lungs every 6 (six) hours as needed for wheezing or shortness of breath.     albuterol (PROVENTIL) (2.5 MG/3ML) 0.083% nebulizer solution Take 3 mLs (2.5 mg total) by nebulization every 6 (six) hours as needed for wheezing or shortness of breath. 360 mL 5   dicyclomine (BENTYL) 10 MG capsule Take 1 capsule (10 mg total) by mouth 3 (three) times daily as needed for spasms. 90 capsule 2   Fluticasone-Umeclidin-Vilant (TRELEGY ELLIPTA) 100-62.5-25 MCG/ACT AEPB Inhale 1 puff into the lungs daily. 60 each 0   gabapentin (NEURONTIN) 100 MG capsule Take 100 mg by mouth 3 (three) times daily as needed.     LORazepam (ATIVAN) 0.5 MG tablet Take 0.25-0.5 mg by mouth at bedtime as needed.     metoprolol succinate (TOPROL-XL) 100 MG 24 hr tablet Take 100 mg by mouth daily. Take with or immediately following a meal.     pantoprazole (PROTONIX) 40 MG tablet Take 40 mg by mouth daily.     Propylene Glycol 0.6 % SOLN Place 2 drops into both eyes daily as needed (for dry eyes).      No current facility-administered medications for this visit.    Allergies   Allergies as of 10/20/2022   (No Known Allergies)     Past Medical History   Past Medical History:  Diagnosis Date   Arthritis    hands   Cancer ([place])    chest- squamous thumb    Complication of anesthesia 1997   Hard to awaken   COPD (chronic obstructive pulmonary disease) ([place])    Dyspnea    Heart failure ([place]) 05/05/2019   Hypertension    Macular degeneration    " early stage"   Pneumonia     x 2   Rheumatic fever    Shingles    Supplemental oxygen dependent    Wears dentures    full upper   Wears glasses     Past Surgical History   Past Surgical History:  Procedure Laterality Date   AMPUTATION Left 04/04/2019   Procedure: AMPUTATION LEFT THUMB;  Surgeon: Leanora Cover, MD;  Location: [facility];  Service: Orthopedics;  Laterality: Left;   CESAREAN SECTION     CHOLECYSTECTOMY     MULTIPLE TOOTH EXTRACTIONS      Past Family History   Family History  Problem Relation Age of Onset   Cancer Mother    Cancer Father    Heart disease Father    Cancer Brother     Past Social History   Social History   Socioeconomic History   Marital status: Divorced    Spouse name: Not on file   Number of children: 5   Years of education: Not on file   Highest education level: Not on file  Occupational History   Not on file  Tobacco Use   Smoking status: Former    Years: 25.00    Types: Cigarettes    Quit date: 1992    Years since quitting: 32.1   Smokeless tobacco: Never  Vaping Use   Vaping Use: Former  Substance and Sexual Activity   Alcohol use: No   Drug use: No   Sexual activity: Not on file  Other Topics Concern   Not on file  Social History Narrative   Not on file   Social Determinants of Health   Financial Resource Strain: Medium Risk (05/05/2019)   Overall Financial Resource Strain (CARDIA)    Difficulty of Paying Living Expenses: Somewhat hard  Food Insecurity: No Food Insecurity (05/05/2019)   Hunger Vital Sign    Worried About Running Out of Food in the Last Year: Never true    Ran Out of Food in the Last Year: Never true  Transportation Needs: No Transportation Needs (05/05/2019)   PRAPARE - Radiographer, therapeutic (Medical): No    Lack of Transportation (Non-Medical): No  Physical Activity: Inactive (05/05/2019)   Exercise Vital Sign    Days of Exercise per Week: 0 days    Minutes of Exercise per Session: 0 min  Stress: No Stress Concern Present (05/05/2019)   [place]    Feeling of Stress : Not at all  Social Connections: Moderately Isolated (05/05/2019)   Social Connection and Isolation Panel [NHANES]    Frequency of Communication with Friends and Family: More than three times a week    Frequency of Social Gatherings with Friends and Family: Never    Attends Religious Services: More than 4 times per year    Active Member of [employer] or Organizations: No    Attends Archivist Meetings: Never    Marital Status: Divorced  Human resources officer Violence: Not At Risk (05/05/2019)   Humiliation, Afraid, Rape, and Kick questionnaire    Fear of Current or Ex-Partner: No    Emotionally Abused: No    Physically Abused: No    Sexually Abused: No    Review of Systems   General: Negative for anorexia, weight loss, fever, chills, fatigue, weakness. ENT: Negative for hoarseness, difficulty swallowing , nasal congestion. CV: Negative for chest pain, angina, palpitations, dyspnea on exertion, peripheral edema.  Respiratory: Negative for dyspnea at rest, dyspnea on exertion, cough, sputum, wheezing.  GI: See history of present illness. GU:  Negative for dysuria, hematuria, urinary incontinence, urinary frequency, nocturnal urination.  Endo: Negative for unusual weight change.     Physical Exam   There were no vitals taken for this visit.   General: Well-nourished, well-developed in no acute distress.  Eyes: No icterus. Mouth: Oropharyngeal mucosa moist and pink , no lesions erythema or exudate. Lungs: Clear to auscultation bilaterally.  Heart: Regular rate and rhythm, no murmurs rubs or gallops.  Abdomen: Bowel sounds  are normal, nontender, nondistended, no hepatosplenomegaly or masses,  no abdominal bruits or hernia , no rebound or guarding.  Rectal: ***  Extremities: No lower extremity edema. No clubbing or deformities. Neuro: Alert and oriented x 4   Skin: Warm and dry, no jaundice.   Psych: Alert and cooperative, normal mood and affect.  Labs   *** Imaging Studies   No results found.  Assessment       PLAN   ***   Laureen Ochs. Bobby Rumpf, [place], [facility]

## 2022-10-20 ENCOUNTER — Ambulatory Visit: Payer: Medicare Other | Admitting: Gastroenterology

## 2022-10-20 ENCOUNTER — Encounter: Payer: Self-pay | Admitting: Gastroenterology

## 2022-10-22 ENCOUNTER — Other Ambulatory Visit (HOSPITAL_COMMUNITY): Payer: Medicare Other

## 2022-10-22 ENCOUNTER — Ambulatory Visit (HOSPITAL_COMMUNITY): Payer: Medicare Other

## 2022-11-11 DIAGNOSIS — J449 Chronic obstructive pulmonary disease, unspecified: Secondary | ICD-10-CM | POA: Diagnosis not present

## 2022-11-24 ENCOUNTER — Ambulatory Visit: Payer: Medicare Other | Admitting: Pulmonary Disease

## 2022-11-24 ENCOUNTER — Encounter: Payer: Self-pay | Admitting: Pulmonary Disease

## 2022-11-24 VITALS — BP 140/78 | HR 82 | Ht 59.5 in | Wt 99.2 lb

## 2022-11-24 DIAGNOSIS — J440 Chronic obstructive pulmonary disease with acute lower respiratory infection: Secondary | ICD-10-CM | POA: Diagnosis not present

## 2022-11-24 DIAGNOSIS — J4489 Other specified chronic obstructive pulmonary disease: Secondary | ICD-10-CM | POA: Diagnosis not present

## 2022-11-24 DIAGNOSIS — J9611 Chronic respiratory failure with hypoxia: Secondary | ICD-10-CM

## 2022-11-24 DIAGNOSIS — J209 Acute bronchitis, unspecified: Secondary | ICD-10-CM | POA: Diagnosis not present

## 2022-11-24 DIAGNOSIS — J439 Emphysema, unspecified: Secondary | ICD-10-CM

## 2022-11-24 MED ORDER — ANORO ELLIPTA 62.5-25 MCG/ACT IN AEPB: 1.0000 | INHALATION_SPRAY | Freq: Every day | RESPIRATORY_TRACT | 5 refills | Status: AC

## 2022-11-24 MED ORDER — AZITHROMYCIN 250 MG PO TABS: ORAL_TABLET | ORAL | 0 refills | Status: AC

## 2022-11-24 NOTE — Patient Instructions (Signed)
Your goal is to keep your oxygen level above 90%, and use your oxygen ahead of time when you do activities that make your oxygen level drop.  Zithromax antibiotic 250 mg pill >> 2 pills on day 1, then 1 pill daily for the next 4 days.  Anoro one puff daily.  This will take the place of trelegy.  Call if not feeling better after course of antibiotics.  Follow up in 6 months.

## 2022-11-24 NOTE — Progress Notes (Signed)
[facility]  Chief Complaint  Patient presents with   Follow-up    Breathing not doing well.  States she thinks she has URI. Feels like she did when she had pneumonia. NO CXR recently or antibiotics     Past Surgical History:  She  has a past surgical history that includes Cholecystectomy; Cesarean section; Multiple tooth extractions; and Amputation (Left, 04/04/2019).  Past Medical History:  Squamous cell carcinoma nailbed, Renal cyst, OA, HTN, Macular degeneration, Pneumonia, Rheumatic fever, Shingles, COVID 13 October 2020  Constitutional:  BP (!) 140/78   Pulse 82   Ht 4' 11.5" (1.511 m)   Wt 99 lb 3.2 oz (45 kg)   SpO2 (!) 89% Comment: ra after walking from lobby to room.  BMI 19.70 kg/m   Brief Summary:  Debbie [name] is a 77 y.o. female former smoker with COPD/emphysema and chronic hypoxic respiratory failure.      Subjective:   She is here with her daughter.  She stopped using trelegy due to thrush and throat irritation.  She felt it helped her breathing otherwise.  She uses albuterol daily.  She has been using more over the past week.  She had a fever a few days ago.  She has been coughing up white to yellow phlegm, and she feels more short of breath while walking.  Her throat has been sore.  She has a runny nose.  No known sick exposures.  She uses her oxygen when she feels she needs it.  Physical Exam:   Appearance - well kempt, sitting in her wheelchair  ENMT - no sinus tenderness, no oral exudate, no LAN, Mallampati 3 airway, no stridor  Respiratory - decreased breath sounds bilaterally, no wheezing or rales  CV - s1s2 regular rate and rhythm, no murmurs  Ext - no clubbing, no edema  Skin - no rashes  Psych - normal mood and affect     Pulmonary testing:  PFT 01/13/13 >> FEV1 0.63 (28%), FEV1% 28, TLC 6.12 (130%), DLCO 28%, +BD  Chest Imaging:  CT angio chest 01/18/19 >> atherosclerosis, centrilobular  emphysema, 8 mm RLL nodule CT angio chest 10/09/20 >> mod centrilobular and paraseptal emphysema, ATX lingula, scarring RML and RLL, 4 mm nodule RLL no change, 8 mm nodule RLL no change  Cardiac Tests:  Echo 10/08/20 >> EF 55 to 60%  Social History:  She  reports that she quit smoking about 32 years ago. Her smoking use included cigarettes. She has never used smokeless tobacco. She reports that she does not drink alcohol and does not use drugs.  Family History:  Her family history includes Cancer in her brother, father, and mother; Heart disease in her father.     Assessment/Plan:   Acute bronchitis. - don't think she needs a chest xray or prednisone at this time - will give her a course of zithromax  COPD with emphysema. - wasn't able to tolerate breztri or trelegy due to thrush and throat irritation - will change to anoro one puff daily - prn albuterol - she has a nebulizer machine   Chronic respiratory failure with hypoxia. - goal SpO2 > 90% - she uses 2 to 3 liters oxygen with exertion and sleep - discussed the importance of using her supplemental oxygen as prescribed  Lt thumb nailbed squamous cell carcinoma. - followed by Dr. Delton Coombes with Oncology  Time Spent Involved in Patient Care on Day of Examination:  35 minutes  Follow up:  Patient Instructions  Your goal is to keep your oxygen level above 90%, and use your oxygen ahead of time when you do activities that make your oxygen level drop.  Zithromax antibiotic 250 mg pill >> 2 pills on day 1, then 1 pill daily for the next 4 days.  Anoro one puff daily.  This will take the place of trelegy.  Call if not feeling better after course of antibiotics.  Follow up in 6 months.  Medication List:   Allergies as of 11/24/2022   No Known Allergies      Medication List        Accurate as of November 24, 2022  3:40 PM. If you have any questions, ask your nurse or doctor.          STOP taking these medications     Trelegy Ellipta 100-62.5-25 MCG/ACT Aepb Generic drug: Fluticasone-Umeclidin-Vilant Stopped by: Chesley Mires, MD       TAKE these medications    albuterol 108 (90 Base) MCG/ACT inhaler Commonly known as: VENTOLIN HFA Inhale 1 puff into the lungs every 6 (six) hours as needed for wheezing or shortness of breath.   albuterol (2.5 MG/3ML) 0.083% nebulizer solution Commonly known as: PROVENTIL Take 3 mLs (2.5 mg total) by nebulization every 6 (six) hours as needed for wheezing or shortness of breath.   Anoro Ellipta 62.5-25 MCG/ACT Aepb Generic drug: umeclidinium-vilanterol Inhale 1 puff into the lungs daily. Started by: Chesley Mires, MD   azithromycin 250 MG tablet Commonly known as: ZITHROMAX Take 2 tablets (500 mg total) by mouth daily for 1 day, THEN 1 tablet (250 mg total) daily for 4 days. Start taking on: November 24, 2022 Started by: Chesley Mires, MD   dicyclomine 10 MG capsule Commonly known as: BENTYL Take 1 capsule (10 mg total) by mouth 3 (three) times daily as needed for spasms.   gabapentin 100 MG capsule Commonly known as: NEURONTIN Take 100 mg by mouth 3 (three) times daily as needed.   LORazepam 0.5 MG tablet Commonly known as: ATIVAN Take 0.25-0.5 mg by mouth at bedtime as needed.   metoprolol succinate 100 MG 24 hr tablet Commonly known as: TOPROL-XL Take 100 mg by mouth daily. Take with or immediately following a meal.   pantoprazole 40 MG tablet Commonly known as: PROTONIX Take 40 mg by mouth daily.   Propylene Glycol 0.6 % Soln Place 2 drops into both eyes daily as needed (for dry eyes).        Signature:  Chesley Mires, MD [place] Pager - [phone] 11/24/2022, 3:40 PM

## 2022-11-26 ENCOUNTER — Ambulatory Visit: Payer: Medicare Other | Admitting: Pulmonary Disease

## 2022-12-10 ENCOUNTER — Ambulatory Visit (HOSPITAL_COMMUNITY)
Admission: RE | Admit: 2022-12-10 | Discharge: 2022-12-10 | Disposition: A | Discharge status: Home / Self Care | Payer: Medicare Other | Source: Ambulatory Visit | Attending: Internal Medicine | Admitting: Internal Medicine

## 2022-12-10 DIAGNOSIS — Z13820 Encounter for screening for osteoporosis: Secondary | ICD-10-CM | POA: Diagnosis not present

## 2022-12-10 DIAGNOSIS — Z1231 Encounter for screening mammogram for malignant neoplasm of breast: Secondary | ICD-10-CM | POA: Insufficient documentation

## 2022-12-10 DIAGNOSIS — Z780 Asymptomatic menopausal state: Secondary | ICD-10-CM | POA: Diagnosis not present

## 2022-12-10 DIAGNOSIS — M810 Age-related osteoporosis without current pathological fracture: Secondary | ICD-10-CM | POA: Diagnosis not present

## 2022-12-12 DIAGNOSIS — J449 Chronic obstructive pulmonary disease, unspecified: Secondary | ICD-10-CM | POA: Diagnosis not present

## 2023-01-07 DIAGNOSIS — I10 Essential (primary) hypertension: Secondary | ICD-10-CM | POA: Diagnosis not present

## 2023-01-07 DIAGNOSIS — Z681 Body mass index (BMI) 19 or less, adult: Secondary | ICD-10-CM | POA: Diagnosis not present

## 2023-01-07 DIAGNOSIS — Z713 Dietary counseling and surveillance: Secondary | ICD-10-CM | POA: Diagnosis not present

## 2023-01-07 DIAGNOSIS — M810 Age-related osteoporosis without current pathological fracture: Secondary | ICD-10-CM | POA: Diagnosis not present

## 2023-01-07 DIAGNOSIS — J449 Chronic obstructive pulmonary disease, unspecified: Secondary | ICD-10-CM | POA: Diagnosis not present

## 2023-01-07 DIAGNOSIS — Z79899 Other long term (current) drug therapy: Secondary | ICD-10-CM | POA: Diagnosis not present

## 2023-01-11 DIAGNOSIS — J449 Chronic obstructive pulmonary disease, unspecified: Secondary | ICD-10-CM | POA: Diagnosis not present

## 2023-01-14 ENCOUNTER — Inpatient Hospital Stay: Payer: Medicare Other

## 2023-01-21 ENCOUNTER — Inpatient Hospital Stay: Payer: Medicare Other | Admitting: Oncology

## 2023-02-11 DIAGNOSIS — J449 Chronic obstructive pulmonary disease, unspecified: Secondary | ICD-10-CM | POA: Diagnosis not present

## 2023-02-23 ENCOUNTER — Other Ambulatory Visit: Payer: Self-pay

## 2023-02-23 DIAGNOSIS — Q6102 Congenital multiple renal cysts: Secondary | ICD-10-CM

## 2023-02-23 DIAGNOSIS — C44609 Unspecified malignant neoplasm of skin of left upper limb, including shoulder: Secondary | ICD-10-CM

## 2023-02-25 ENCOUNTER — Inpatient Hospital Stay: Payer: Medicare Other | Attending: Internal Medicine

## 2023-02-25 DIAGNOSIS — I2510 Atherosclerotic heart disease of native coronary artery without angina pectoris: Secondary | ICD-10-CM | POA: Diagnosis not present

## 2023-02-25 DIAGNOSIS — E0590 Thyrotoxicosis, unspecified without thyrotoxic crisis or storm: Secondary | ICD-10-CM | POA: Diagnosis not present

## 2023-03-04 ENCOUNTER — Inpatient Hospital Stay: Payer: Medicare Other | Admitting: Oncology

## 2023-03-04 DIAGNOSIS — I2510 Atherosclerotic heart disease of native coronary artery without angina pectoris: Secondary | ICD-10-CM | POA: Diagnosis not present

## 2023-03-04 DIAGNOSIS — E0590 Thyrotoxicosis, unspecified without thyrotoxic crisis or storm: Secondary | ICD-10-CM | POA: Diagnosis not present

## 2023-03-04 DIAGNOSIS — F5101 Primary insomnia: Secondary | ICD-10-CM | POA: Diagnosis not present

## 2023-03-04 DIAGNOSIS — J449 Chronic obstructive pulmonary disease, unspecified: Secondary | ICD-10-CM | POA: Diagnosis not present

## 2023-03-04 DIAGNOSIS — K219 Gastro-esophageal reflux disease without esophagitis: Secondary | ICD-10-CM | POA: Diagnosis not present

## 2023-03-04 DIAGNOSIS — E782 Mixed hyperlipidemia: Secondary | ICD-10-CM | POA: Diagnosis not present

## 2023-03-04 DIAGNOSIS — M810 Age-related osteoporosis without current pathological fracture: Secondary | ICD-10-CM | POA: Diagnosis not present

## 2023-03-04 DIAGNOSIS — M25511 Pain in right shoulder: Secondary | ICD-10-CM | POA: Diagnosis not present

## 2023-03-04 DIAGNOSIS — N281 Cyst of kidney, acquired: Secondary | ICD-10-CM | POA: Diagnosis not present

## 2023-03-04 DIAGNOSIS — I10 Essential (primary) hypertension: Secondary | ICD-10-CM | POA: Diagnosis not present

## 2023-03-13 DIAGNOSIS — J449 Chronic obstructive pulmonary disease, unspecified: Secondary | ICD-10-CM | POA: Diagnosis not present

## 2023-03-18 ENCOUNTER — Inpatient Hospital Stay: Payer: Medicare Other

## 2023-03-25 ENCOUNTER — Inpatient Hospital Stay: Payer: Medicare Other | Admitting: Oncology

## 2023-03-30 ENCOUNTER — Encounter: Payer: Self-pay | Admitting: Dermatology

## 2023-04-13 DIAGNOSIS — J449 Chronic obstructive pulmonary disease, unspecified: Secondary | ICD-10-CM | POA: Diagnosis not present

## 2023-06-16 NOTE — Progress Notes (Deleted)
Debbie [name], female    DOB: 31-Aug-1945    MRN: [id number]   Brief patient profile:  77  yo*** *** referred to pulmonary clinic in [place]  06/17/2023 by *** for ***      History of Present Illness  06/17/2023  Pulmonary/ 1st office eval/ Sherene Sires / [place] [facility]  No chief complaint on file.    Dyspnea:  *** Cough: *** Sleep: *** SABA use: *** 02: *** Lung cancer screen: ***   Outpatient Medications Prior to Visit  Medication Sig Dispense Refill   albuterol (PROVENTIL HFA;VENTOLIN HFA) 108 (90 Base) MCG/ACT inhaler Inhale 1 puff into the lungs every 6 (six) hours as needed for wheezing or shortness of breath.     albuterol (PROVENTIL) (2.5 MG/3ML) 0.083% nebulizer solution Take 3 mLs (2.5 mg total) by nebulization every 6 (six) hours as needed for wheezing or shortness of breath. 360 mL 5   dicyclomine (BENTYL) 10 MG capsule Take 1 capsule (10 mg total) by mouth 3 (three) times daily as needed for spasms. 90 capsule 2   gabapentin (NEURONTIN) 100 MG capsule Take 100 mg by mouth 3 (three) times daily as needed.     LORazepam (ATIVAN) 0.5 MG tablet Take 0.25-0.5 mg by mouth at bedtime as needed.     metoprolol succinate (TOPROL-XL) 100 MG 24 hr tablet Take 100 mg by mouth daily. Take with or immediately following a meal.     pantoprazole (PROTONIX) 40 MG tablet Take 40 mg by mouth daily.     Propylene Glycol 0.6 % SOLN Place 2 drops into both eyes daily as needed (for dry eyes).      umeclidinium-vilanterol (ANORO ELLIPTA) 62.5-25 MCG/ACT AEPB Inhale 1 puff into the lungs daily. 60 each 5   No facility-administered medications prior to visit.    Past Medical History:  Diagnosis Date   Arthritis    hands   Cancer (HCC)    chest- squamous thumb   Complication of anesthesia 1997   Hard to awaken   COPD (chronic obstructive pulmonary disease) (HCC)    Dyspnea    Heart failure (HCC) 05/05/2019   Hypertension    Macular degeneration    " early stage"   Pneumonia      x 2   Rheumatic fever    Shingles    Supplemental oxygen dependent    Wears dentures    full upper   Wears glasses       Objective:     There were no vitals taken for this visit.         Assessment   No problem-specific Assessment & Plan notes found for this encounter.     Sandrea Hughs, MD 06/16/2023

## 2023-06-17 ENCOUNTER — Encounter: Payer: Self-pay | Admitting: Internal Medicine

## 2023-06-17 ENCOUNTER — Ambulatory Visit: Payer: Medicare Other | Admitting: Internal Medicine

## 2023-07-14 DIAGNOSIS — J449 Chronic obstructive pulmonary disease, unspecified: Secondary | ICD-10-CM | POA: Diagnosis not present

## 2023-08-13 DIAGNOSIS — J449 Chronic obstructive pulmonary disease, unspecified: Secondary | ICD-10-CM | POA: Diagnosis not present

## 2023-09-16 ENCOUNTER — Inpatient Hospital Stay: Payer: Medicare Other | Attending: Internal Medicine

## 2023-09-23 ENCOUNTER — Inpatient Hospital Stay: Payer: Medicare Other | Admitting: Physician Assistant

## 2024-04-21 ENCOUNTER — Other Ambulatory Visit: Payer: Self-pay | Admitting: *Deleted
